# Patient Record
Sex: Female | Born: 1975 | Race: Black or African American | Hispanic: No | Marital: Single | Smoking: Current every day smoker
Health system: Southern US, Community
[De-identification: ages and names within clinical notes are randomized; demographics above are authoritative.]

## PROBLEM LIST (undated history)

## (undated) DIAGNOSIS — I10 Essential (primary) hypertension: Secondary | ICD-10-CM

## (undated) DIAGNOSIS — O24419 Gestational diabetes mellitus in pregnancy, unspecified control: Secondary | ICD-10-CM

## (undated) HISTORY — PX: ECTOPIC PREGNANCY SURGERY: SHX613

---

## 2006-03-14 ENCOUNTER — Emergency Department (HOSPITAL_COMMUNITY): Admission: EM | Admit: 2006-03-14 | Discharge: 2006-03-15 | Payer: Self-pay | Admitting: Emergency Medicine

## 2006-03-27 ENCOUNTER — Emergency Department (HOSPITAL_COMMUNITY): Admission: EM | Admit: 2006-03-27 | Discharge: 2006-03-27 | Payer: Self-pay | Admitting: Emergency Medicine

## 2010-04-03 ENCOUNTER — Emergency Department (HOSPITAL_BASED_OUTPATIENT_CLINIC_OR_DEPARTMENT_OTHER)
Admission: EM | Admit: 2010-04-03 | Discharge: 2010-04-03 | Disposition: A | Discharge status: Home / Self Care | Payer: Self-pay | Attending: Emergency Medicine | Admitting: Emergency Medicine

## 2010-04-03 DIAGNOSIS — N6459 Other signs and symptoms in breast: Secondary | ICD-10-CM | POA: Insufficient documentation

## 2010-04-03 DIAGNOSIS — N61 Mastitis without abscess: Secondary | ICD-10-CM | POA: Insufficient documentation

## 2010-04-03 DIAGNOSIS — F17200 Nicotine dependence, unspecified, uncomplicated: Secondary | ICD-10-CM | POA: Insufficient documentation

## 2010-04-03 LAB — BASIC METABOLIC PANEL
CO2: 22 mEq/L (ref 19–32)
GFR calc Af Amer: >60 mL/min (ref >60)
GFR calc non Af Amer: >60 mL/min (ref >60)
Glucose, Bld: 101 mg/dL — ABNORMAL HIGH (ref 70–99)
Potassium: 4.4 mEq/L (ref 3.5–5.1)
Sodium: 142 mEq/L (ref 135–145)

## 2010-04-03 LAB — DIFFERENTIAL
Basophils Absolute: 0 10*3/uL (ref 0.0–0.1)
Basophils Relative: 0 % (ref 0–1)
Lymphocytes Relative: 27 % (ref 12–46)
Neutro Abs: 4.6 10*3/uL (ref 1.7–7.7)
Neutrophils Relative %: 64 % (ref 43–77)

## 2010-04-03 LAB — CBC
HCT: 40 % (ref 36.0–46.0)
Hemoglobin: 13.6 g/dL (ref 12.0–15.0)
WBC: 7.1 10*3/uL (ref 4.0–10.5)

## 2010-04-03 LAB — PREGNANCY, URINE: Preg Test, Ur: NEGATIVE

## 2010-09-11 ENCOUNTER — Encounter: Payer: Self-pay | Admitting: *Deleted

## 2010-09-11 ENCOUNTER — Emergency Department (HOSPITAL_BASED_OUTPATIENT_CLINIC_OR_DEPARTMENT_OTHER)
Admission: EM | Admit: 2010-09-11 | Discharge: 2010-09-11 | Disposition: A | Discharge status: Home / Self Care | Payer: Self-pay | Attending: Emergency Medicine | Admitting: Emergency Medicine

## 2010-09-11 DIAGNOSIS — N63 Unspecified lump in unspecified breast: Secondary | ICD-10-CM | POA: Insufficient documentation

## 2010-09-11 DIAGNOSIS — N61 Mastitis without abscess: Secondary | ICD-10-CM | POA: Insufficient documentation

## 2010-09-11 DIAGNOSIS — N611 Abscess of the breast and nipple: Secondary | ICD-10-CM

## 2010-09-11 DIAGNOSIS — I10 Essential (primary) hypertension: Secondary | ICD-10-CM | POA: Insufficient documentation

## 2010-09-11 HISTORY — DX: Gestational diabetes mellitus in pregnancy, unspecified control: O24.419

## 2010-09-11 HISTORY — DX: Essential (primary) hypertension: I10

## 2010-09-11 MED ORDER — CLINDAMYCIN HCL 150 MG PO CAPS
300.0000 mg | ORAL_CAPSULE | Freq: Once | ORAL | Status: AC
  Administered 2010-09-11: 300 mg via ORAL
  Filled 2010-09-11: qty 2

## 2010-09-11 MED ORDER — CLINDAMYCIN HCL 150 MG PO CAPS: 300.0000 mg | ORAL_CAPSULE | Freq: Three times a day (TID) | ORAL | Status: AC

## 2010-09-11 MED ORDER — OXYCODONE-ACETAMINOPHEN 5-325 MG PO TABS: 2.0000 | ORAL_TABLET | ORAL | Status: AC | PRN

## 2010-09-11 NOTE — ED Notes (Signed)
Dr. Radford Pax in to aspirate left breast.  Skin prep with betadine.  Cleaned post procedure, band aid applied.

## 2010-09-11 NOTE — ED Notes (Signed)
Patient states she has an abscess on her left nipple which started one week ago.  States she had same abscess approximately 2 months ago, was treated and resolved.

## 2010-09-11 NOTE — ED Provider Notes (Signed)
History   Patient states she has an abscess on her left nipple which started one week ago. The pain and swelling has been increasing in severity. Fairly persistent. States she had same abscess approximately 2 months ago, was treated and resolved.  Denies nausea vomiting fever chills.   CSN: [id number] Arrival date & time: 09/11/2010  8:37 AM  Chief Complaint  Patient presents with  . Abscess    left nipple abscess   Patient is a 35 y.o. female presenting with abscess.  Abscess     Past Medical History  Diagnosis Date  . Hypertension   . Gestational diabetes     Past Surgical History  Procedure Date  . Ectopic pregnancy surgery     No family history on file.  History  Substance Use Topics  . Smoking status: Current Everyday Smoker -- 0.5 packs/day  . Smokeless tobacco: Not on file  . Alcohol Use: No    OB History    Grav Para Term Preterm Abortions TAB SAB Ect Mult Living                  Review of Systems  Physical Exam  BP 161/99  Pulse 63  Temp(Src) 98.8 F (37.1 C) (Oral)  Resp 20  Ht 5\' 5"  (1.651 m)  Wt 210 lb (95.255 kg)  BMI 34.95 kg/m2  SpO2 100%  LMP 09/09/2010  Physical Exam  Constitutional well-developed well-nourished black female HEENT: Normocephalic atraumatic. Cardiovascular normal right regular rhythm. Pulmonary: Normal effort no difficulty breathing.  Breast exam: Pendulous breasts warm left nipple tenderness at approximately 11:00. Some induration. No active drainage. Abdomen: Soft nontender obese Extremities: Normal with no abnormalities. Neuro: Negative. Skin: Warm dry no diaphoresis no rash.      ED Course  Procedures Needle aspiration: The left breast in areola area was prepped with Betadine. It was then anesthetized with Hurricaine spray. Using sterile technique an 18-gauge needle was injected at approximately 11:00 position with gentle aspiration. There is no pus or blood returned. Following the procedure the area was  cleansed and dressed with a Band-Aid and Neosporin. MDM Breast abscess, cellulitis, breast cyst, neoplasm.  Plan: Will start the patient on by mouth clindamycin and Percocet for pain and will encourage followup with GYN doctor within the next 4872 hours. She was also encouraged to use hot compresses to the area to encourage drainage. She is to return to the emergency department on should she develop any fever chills vomiting or other new symptomatology.      Nelia Shi, MD 09/11/10 [phone]

## 2010-12-14 ENCOUNTER — Encounter (HOSPITAL_BASED_OUTPATIENT_CLINIC_OR_DEPARTMENT_OTHER): Payer: Self-pay

## 2010-12-14 ENCOUNTER — Emergency Department (INDEPENDENT_AMBULATORY_CARE_PROVIDER_SITE_OTHER): Payer: Medicaid Other

## 2010-12-14 ENCOUNTER — Emergency Department (HOSPITAL_BASED_OUTPATIENT_CLINIC_OR_DEPARTMENT_OTHER): Payer: Medicaid Other

## 2010-12-14 ENCOUNTER — Emergency Department (HOSPITAL_BASED_OUTPATIENT_CLINIC_OR_DEPARTMENT_OTHER)
Admission: EM | Admit: 2010-12-14 | Discharge: 2010-12-14 | Disposition: A | Discharge status: Home / Self Care | Payer: Medicaid Other | Attending: Emergency Medicine | Admitting: Emergency Medicine

## 2010-12-14 DIAGNOSIS — O99891 Other specified diseases and conditions complicating pregnancy: Secondary | ICD-10-CM

## 2010-12-14 DIAGNOSIS — O209 Hemorrhage in early pregnancy, unspecified: Secondary | ICD-10-CM

## 2010-12-14 DIAGNOSIS — R110 Nausea: Secondary | ICD-10-CM

## 2010-12-14 DIAGNOSIS — I10 Essential (primary) hypertension: Secondary | ICD-10-CM | POA: Insufficient documentation

## 2010-12-14 DIAGNOSIS — F17200 Nicotine dependence, unspecified, uncomplicated: Secondary | ICD-10-CM | POA: Insufficient documentation

## 2010-12-14 DIAGNOSIS — R109 Unspecified abdominal pain: Secondary | ICD-10-CM

## 2010-12-14 LAB — BASIC METABOLIC PANEL
BUN: 8 mg/dL (ref 6–23)
CO2: 24 mEq/L (ref 19–32)
GFR calc non Af Amer: 82 mL/min — ABNORMAL LOW (ref >90)
Glucose, Bld: 121 mg/dL — ABNORMAL HIGH (ref 70–99)
Potassium: 4 mEq/L (ref 3.5–5.1)
Sodium: 137 mEq/L (ref 135–145)

## 2010-12-14 LAB — URINE MICROSCOPIC-ADD ON

## 2010-12-14 LAB — CBC
HCT: 39.8 % (ref 36.0–46.0)
Hemoglobin: 13.6 g/dL (ref 12.0–15.0)
MCH: 26.8 pg (ref 26.0–34.0)
MCHC: 34.2 g/dL (ref 30.0–36.0)
RBC: 5.07 MIL/uL (ref 3.87–5.11)

## 2010-12-14 LAB — URINALYSIS, ROUTINE W REFLEX MICROSCOPIC
Bilirubin Urine: NEGATIVE
Glucose, UA: NEGATIVE mg/dL
Specific Gravity, Urine: 1.022 (ref 1.005–1.030)
Urobilinogen, UA: 1 mg/dL (ref 0.0–1.0)

## 2010-12-14 LAB — PREGNANCY, URINE: Preg Test, Ur: POSITIVE

## 2010-12-14 LAB — HCG, QUANTITATIVE, PREGNANCY: hCG, Beta Chain, Quant, S: 1336 m[IU]/mL — ABNORMAL HIGH (ref <5)

## 2010-12-14 MED ORDER — SODIUM CHLORIDE 0.9 % IV SOLN: INTRAVENOUS | Status: DC

## 2010-12-14 MED ORDER — HYDROMORPHONE HCL PF 1 MG/ML IJ SOLN
INTRAMUSCULAR | Status: AC
  Administered 2010-12-14: 1 mg via INTRAVENOUS
  Filled 2010-12-14: qty 1

## 2010-12-14 MED ORDER — ONDANSETRON HCL 4 MG/2ML IJ SOLN
4.0000 mg | Freq: Once | INTRAMUSCULAR | Status: AC
  Administered 2010-12-14: 4 mg via INTRAVENOUS
  Filled 2010-12-14: qty 2

## 2010-12-14 MED ORDER — SODIUM CHLORIDE 0.9 % IV BOLUS (SEPSIS)
1000.0000 mL | Freq: Once | INTRAVENOUS | Status: AC
  Administered 2010-12-14: 1000 mL via INTRAVENOUS

## 2010-12-14 MED ORDER — HYDROMORPHONE HCL 2 MG/ML IJ SOLN: 1.0000 mg | Freq: Once | INTRAMUSCULAR | Status: AC

## 2010-12-14 MED ORDER — HYDROMORPHONE HCL PF 1 MG/ML IJ SOLN
1.0000 mg | Freq: Once | INTRAMUSCULAR | Status: AC
  Administered 2010-12-14: 1 mg via INTRAVENOUS
  Filled 2010-12-14: qty 1

## 2010-12-14 NOTE — ED Provider Notes (Addendum)
History     CSN: [id number] Arrival date & time: 12/14/2010  4:55 AM   First MD Initiated Contact with Patient 12/14/10 0450      Chief Complaint  Patient presents with  . Dysmenorrhea    (Consider location/radiation/quality/duration/timing/severity/associated sxs/prior treatment) Patient is a 35 y.o. female presenting with abdominal pain. The history is provided by the patient.  Abdominal Pain The primary symptoms of the illness include abdominal pain and vaginal bleeding. The primary symptoms of the illness do not include fever, shortness of breath, nausea, vomiting, diarrhea, dysuria or vaginal discharge. The current episode started more than 2 days ago (Onset 4 days ago.). The onset of the illness was sudden. The problem has been gradually worsening.  The abdominal pain is generalized (But greater in both lower quadrants of the abdomen, radiates to the back.). The abdominal pain radiates to the back. The severity of the abdominal pain is 10/10. The abdominal pain is relieved by nothing. Exacerbated by: Nothing.  Vaginal bleeding was first noticed 2 days ago. Vaginal bleeding other than menses is a new problem. Vaginal bleeding is resolved since it began.  The patient has not had a change in bowel habit. Additional symptoms associated with the illness include back pain. Symptoms associated with the illness do not include chills, diaphoresis, heartburn, constipation, urgency or frequency.   No history of abdominal surgery. Patient has been having a history of heavy vaginal bleeding at times in the past. The bleeding occurred 2 days ago lasted for one day and then stopped the abdominal pain has been present for 4 days. This morning the abdominal pain got worse with generalized greater in both lower quadrants. Pain currently is 10 out of 10. Described as sharp. Non-crampy. Prior menstrual period was 2 months ago. The onset of her mesh. Usually associated with heavy bleeding and some clots and  abdominal pain can last usually for 3 days. It is unusual that the bleeding was heavy for just one day this time. Patient is gravida 3 para 2 with a tubal pregnancy. Denies vaginal discharge. Past Medical History  Diagnosis Date  . Hypertension   . Gestational diabetes     Past Surgical History  Procedure Date  . Ectopic pregnancy surgery     History reviewed. No pertinent family history.  History  Substance Use Topics  . Smoking status: Current Everyday Smoker -- 0.5 packs/day    Types: Cigarettes  . Smokeless tobacco: Never Used  . Alcohol Use: No    OB History    Grav Para Term Preterm Abortions TAB SAB Ect Mult Living                  Review of Systems  Constitutional: Negative for fever, chills and diaphoresis.  HENT: Negative for neck pain.   Eyes: Negative for visual disturbance.  Respiratory: Negative for shortness of breath.   Gastrointestinal: Positive for abdominal pain. Negative for heartburn, nausea, vomiting, diarrhea and constipation.  Genitourinary: Positive for vaginal bleeding. Negative for dysuria, urgency, frequency, flank pain and vaginal discharge.  Musculoskeletal: Positive for back pain.  Neurological: Negative for dizziness and headaches.  Hematological: Does not bruise/bleed easily.    Allergies  Review of patient's allergies indicates no known allergies.  Home Medications  No current outpatient prescriptions on file.  BP 129/76  Pulse 69  Temp(Src) 97.9 F (36.6 C) (Oral)  Resp 18  Ht 5\' 5"  (1.651 m)  Wt 210 lb (95.255 kg)  BMI 34.95 kg/m2  SpO2 100%  LMP 10/14/2010  Physical Exam  Nursing note and vitals reviewed. Constitutional: She is oriented to person, place, and time. She appears well-developed and well-nourished.  HENT:  Head: Normocephalic and atraumatic.  Eyes: Conjunctivae and EOM are normal. Pupils are equal, round, and reactive to light.  Neck: Normal range of motion. Neck supple.  Cardiovascular: Normal rate,  regular rhythm and normal heart sounds.   No murmur heard. Pulmonary/Chest: Effort normal and breath sounds normal.  Abdominal: Soft. Bowel sounds are normal. She exhibits no mass. There is no tenderness.  Genitourinary: No vaginal discharge found.       No adnexal mass or tenderness, no vaginal bleeding. Uterus not enlarged. Exam limited by body habitus.   Musculoskeletal: Normal range of motion.  Neurological: She is alert and oriented to person, place, and time. No cranial nerve deficit. She exhibits normal muscle tone. Coordination normal.  Skin: Skin is warm. No rash noted.    ED Course  Procedures (including critical care time)   Labs Reviewed  CBC  BASIC METABOLIC PANEL  URINALYSIS, ROUTINE W REFLEX MICROSCOPIC  PREGNANCY, URINE   Results for orders placed during the hospital encounter of 12/14/10  CBC      Component Value Range   WBC 8.6  4.0 - 10.5 (K/uL)   RBC 5.07  3.87 - 5.11 (MIL/uL)   Hemoglobin 13.6  12.0 - 15.0 (g/dL)   HCT 16.1  09.6 - 04.5 (%)   MCV 78.5  78.0 - 100.0 (fL)   MCH 26.8  26.0 - 34.0 (pg)   MCHC 34.2  30.0 - 36.0 (g/dL)   RDW 40.9  81.1 - 91.4 (%)   Platelets 248  150 - 400 (K/uL)  BASIC METABOLIC PANEL      Component Value Range   Sodium 137  135 - 145 (mEq/L)   Potassium 4.0  3.5 - 5.1 (mEq/L)   Chloride 105  96 - 112 (mEq/L)   CO2 24  19 - 32 (mEq/L)   Glucose, Bld 121 (*) 70 - 99 (mg/dL)   BUN 8  6 - 23 (mg/dL)   Creatinine, Ser 7.82  0.50 - 1.10 (mg/dL)   Calcium 9.2  8.4 - 95.6 (mg/dL)   GFR calc non Af Amer 82 (*) >90 (mL/min)   GFR calc Af Amer >90  >90 (mL/min)  URINALYSIS, ROUTINE W REFLEX MICROSCOPIC      Component Value Range   Color, Urine YELLOW  YELLOW    Appearance CLOUDY (*) CLEAR    Specific Gravity, Urine 1.022  1.005 - 1.030    pH 5.5  5.0 - 8.0    Glucose, UA NEGATIVE  NEGATIVE (mg/dL)   Hgb urine dipstick TRACE (*) NEGATIVE    Bilirubin Urine NEGATIVE  NEGATIVE    Ketones, ur NEGATIVE  NEGATIVE (mg/dL)    Protein, ur NEGATIVE  NEGATIVE (mg/dL)   Urobilinogen, UA 1.0  0.0 - 1.0 (mg/dL)   Nitrite NEGATIVE  NEGATIVE    Leukocytes, UA SMALL (*) NEGATIVE   PREGNANCY, URINE      Component Value Range   Preg Test, Ur POSITIVE    URINE MICROSCOPIC-ADD ON      Component Value Range   Squamous Epithelial / LPF MANY (*) RARE    WBC, UA 3-6  <3 (WBC/hpf)   RBC / HPF 3-6  <3 (RBC/hpf)   Bacteria, UA MANY (*) RARE      Impression: Pregnancy 9 weeks by dates Lower quadrant abdominal pain    MDM  Patient history of heavy vaginal bleeding at times periods can be irregular especially. Was 2 months ago however vaginal bleeding lasted only for one day and was 2 days ago. With unusual as the patient had, pain prior to the menstrual bleeding and and has continued through the menstrual bleeding until now. The pain is generalized but localized more to the lower quadrants radiates to the back. CBC reveals no leukocytosis. CT of the abdomen and pelvis ordered to rule out acute abdominal process. Urine pregnancy test pending.  Progressive tests is positive discussed with patient last measured. Was September 9 at approximately 9 weeks by dates CT of abdomen has been canceled. Have ordered OB ultrasound to rule out ectopic pregnancy. Patient's abdominal pain improved with pain medicines.  As stated above patient has had a prior tubal pregnancy. She underwent surgery for that tubal pregnancy and had her tube removed does not recall which side. Patient has no recollection of getting RhoGAM shots in her previous pregnancies. Does not recall her blood type. Ultrasound will be available at 8:00 in the morning.        Shelda Jakes, MD 12/14/10 [id number]  Shelda Jakes, MD 12/14/10 2020

## 2010-12-14 NOTE — ED Notes (Signed)
Report received from Maryruth Hancock, RN, care assumed.

## 2010-12-14 NOTE — ED Notes (Signed)
Pt returned from ultrasound

## 2010-12-14 NOTE — ED Notes (Signed)
Pt asking for pain medication, MD informed and new orders received.

## 2010-12-14 NOTE — ED Notes (Signed)
Pt states that she had not had a period in over two months, last night she began bleeding and pt states that bleeding was more heavy than normal.  Pt states that she has changed pads 6 times in the past 48 hours.  Pt states that she has severe abdominal cramping which extends to her back and pelvic area.

## 2010-12-14 NOTE — ED Provider Notes (Signed)
  Physical Exam  BP 147/68  Pulse 57  Temp(Src) 97.9 F (36.6 C) (Oral)  Resp 18  Ht 5\' 5"  (1.651 m)  Wt 210 lb (95.255 kg)  BMI 34.95 kg/m2  SpO2 100%  LMP 10/11/2010  Physical Exam  ED Course  Procedures  MDM 35yF with abdominal/back pain and early pregnancy. No IUP on [place]. Sharene Butters is only 1,300 though. Discussed with pt and fiance possibilities of early pregnancy, ongoing abortion or possible ectopic. PT understands need for follow-up quant and [place] in 2 days. Understands signs/symptoms to return for re-evaluation sooner. Pt HD stable and NAD upon DC.     Raeford Razor, MD 12/14/10 [phone]

## 2010-12-14 NOTE — ED Notes (Signed)
Awaiting ultrasound.

## 2010-12-31 ENCOUNTER — Emergency Department (HOSPITAL_BASED_OUTPATIENT_CLINIC_OR_DEPARTMENT_OTHER)
Admission: EM | Admit: 2010-12-31 | Discharge: 2010-12-31 | Disposition: A | Discharge status: Home / Self Care | Payer: Medicaid Other | Attending: Emergency Medicine | Admitting: Emergency Medicine

## 2010-12-31 ENCOUNTER — Encounter (HOSPITAL_BASED_OUTPATIENT_CLINIC_OR_DEPARTMENT_OTHER): Payer: Self-pay | Admitting: *Deleted

## 2010-12-31 ENCOUNTER — Emergency Department (INDEPENDENT_AMBULATORY_CARE_PROVIDER_SITE_OTHER): Payer: Medicaid Other

## 2010-12-31 DIAGNOSIS — O2690 Pregnancy related conditions, unspecified, unspecified trimester: Secondary | ICD-10-CM | POA: Insufficient documentation

## 2010-12-31 DIAGNOSIS — I10 Essential (primary) hypertension: Secondary | ICD-10-CM | POA: Insufficient documentation

## 2010-12-31 DIAGNOSIS — R112 Nausea with vomiting, unspecified: Secondary | ICD-10-CM

## 2010-12-31 DIAGNOSIS — R109 Unspecified abdominal pain: Secondary | ICD-10-CM

## 2010-12-31 DIAGNOSIS — F17200 Nicotine dependence, unspecified, uncomplicated: Secondary | ICD-10-CM | POA: Insufficient documentation

## 2010-12-31 DIAGNOSIS — Z331 Pregnant state, incidental: Secondary | ICD-10-CM

## 2010-12-31 DIAGNOSIS — D259 Leiomyoma of uterus, unspecified: Secondary | ICD-10-CM

## 2010-12-31 DIAGNOSIS — O99891 Other specified diseases and conditions complicating pregnancy: Secondary | ICD-10-CM

## 2010-12-31 DIAGNOSIS — O3410 Maternal care for benign tumor of corpus uteri, unspecified trimester: Secondary | ICD-10-CM

## 2010-12-31 LAB — WET PREP, GENITAL
Clue Cells Wet Prep HPF POC: NONE SEEN
Trich, Wet Prep: NONE SEEN

## 2010-12-31 LAB — URINALYSIS, ROUTINE W REFLEX MICROSCOPIC
Bilirubin Urine: NEGATIVE
Glucose, UA: NEGATIVE mg/dL
Ketones, ur: NEGATIVE mg/dL
Protein, ur: NEGATIVE mg/dL
Urobilinogen, UA: 0.2 mg/dL (ref 0.0–1.0)

## 2010-12-31 LAB — COMPREHENSIVE METABOLIC PANEL
Alkaline Phosphatase: 59 U/L (ref 39–117)
BUN: 9 mg/dL (ref 6–23)
Creatinine, Ser: 0.9 mg/dL (ref 0.50–1.10)
GFR calc Af Amer: >90 mL/min (ref >90)
Glucose, Bld: 119 mg/dL — ABNORMAL HIGH (ref 70–99)
Potassium: 3.8 mEq/L (ref 3.5–5.1)
Total Bilirubin: 0.2 mg/dL — ABNORMAL LOW (ref 0.3–1.2)
Total Protein: 7.1 g/dL (ref 6.0–8.3)

## 2010-12-31 LAB — CBC
HCT: 39.4 % (ref 36.0–46.0)
Hemoglobin: 13.2 g/dL (ref 12.0–15.0)
MCH: 26.7 pg (ref 26.0–34.0)
MCHC: 33.5 g/dL (ref 30.0–36.0)
MCHC: 34.2 g/dL (ref 30.0–36.0)
MCV: 78.2 fL (ref 78.0–100.0)
MCV: 78.6 fL (ref 78.0–100.0)
Platelets: 206 10*3/uL (ref 150–400)
RBC: 4.68 MIL/uL (ref 3.87–5.11)
RDW: 14 % (ref 11.5–15.5)

## 2010-12-31 LAB — DIFFERENTIAL
Basophils Relative: 0 % (ref 0–1)
Eosinophils Absolute: 0.1 10*3/uL (ref 0.0–0.7)
Eosinophils Relative: 1 % (ref 0–5)
Lymphs Abs: 2.8 10*3/uL (ref 0.7–4.0)
Monocytes Relative: 8 % (ref 3–12)

## 2010-12-31 LAB — URINE MICROSCOPIC-ADD ON

## 2010-12-31 LAB — GC/CHLAMYDIA PROBE AMP, GENITAL
Chlamydia, DNA Probe: NEGATIVE
GC Probe Amp, Genital: NEGATIVE

## 2010-12-31 LAB — RPR: RPR Ser Ql: NONREACTIVE

## 2010-12-31 LAB — HCG, QUANTITATIVE, PREGNANCY: hCG, Beta Chain, Quant, S: 35597 m[IU]/mL — ABNORMAL HIGH (ref <5)

## 2010-12-31 MED ORDER — MORPHINE SULFATE 4 MG/ML IJ SOLN
4.0000 mg | Freq: Once | INTRAMUSCULAR | Status: AC
  Administered 2010-12-31: 4 mg via INTRAVENOUS
  Filled 2010-12-31: qty 1

## 2010-12-31 MED ORDER — ONDANSETRON HCL 4 MG/2ML IJ SOLN
4.0000 mg | Freq: Once | INTRAMUSCULAR | Status: AC
  Administered 2010-12-31: 4 mg via INTRAVENOUS
  Filled 2010-12-31: qty 2

## 2010-12-31 MED ORDER — SODIUM CHLORIDE 0.9 % IV BOLUS (SEPSIS)
1000.0000 mL | Freq: Once | INTRAVENOUS | Status: AC
  Administered 2010-12-31: 1000 mL via INTRAVENOUS

## 2010-12-31 NOTE — ED Notes (Signed)
Pt states that she was seen here earlier this month for questionable pregnancy and had low HCG count. Pt [facility] and has an appt there on Monday but has had worsening abd pain tonight with vomiting.

## 2010-12-31 NOTE — ED Notes (Signed)
Pt given ice chips. Pt aware of plan of care and wait for ultrasound tech this am.

## 2010-12-31 NOTE — ED Notes (Signed)
Pt transported to ultrasound.

## 2010-12-31 NOTE — ED Notes (Signed)
Asked pt if she has an ob/gyn states she doesn't yet but has an appointment with the health dept next week to obtain necessary paperwork to get medicaid and obtain an ob/gyn. Verbalizes understanding of importance of prenatal care.

## 2010-12-31 NOTE — ED Provider Notes (Signed)
No acute abnormality seen on abdominal US.  Pelvic US with 7 w. 2 days iug.  Patient advised close followup with prenatal care by OB.  Hilario Quarry, MD 12/31/10 [phone]

## 2010-12-31 NOTE — ED Notes (Signed)
Returned from ultrasound pt is sitting up on side of bed looking at magazine

## 2010-12-31 NOTE — ED Notes (Signed)
Md made aware of pt and condition, pt states she doesn't want pain meds just wants to know whats wrong

## 2010-12-31 NOTE — ED Notes (Signed)
MD at bedside. 

## 2010-12-31 NOTE — ED Notes (Signed)
Pt c/o pain again when waking, MD made aware.

## 2010-12-31 NOTE — ED Provider Notes (Signed)
History     CSN: [id number] Arrival date & time: 12/31/2010 12:26 AM   First MD Initiated Contact with Patient 12/31/10 0127      Chief Complaint  Patient presents with  . Abdominal Pain    (Consider location/radiation/quality/duration/timing/severity/associated sxs/prior treatment) HPI 35 year old female who is currently pregnant in first trimester had an onset at 3 PM of crampy periumbilical pain. Pain has gotten worse and is now severe. There is no radiation of pain. Current pain is rated at 7/10, but pain has been as severe as 9/10. Nothing makes it better, nothing makes it worse. There has been no associated nausea, vomiting, or diarrhea. She denies any vaginal discharge or bleeding. She denies fever, chills, sweats. She is gravida 4 para 2 with one ectopic pregnancy. Past Medical History  Diagnosis Date  . Hypertension   . Gestational diabetes     Past Surgical History  Procedure Date  . Ectopic pregnancy surgery     History reviewed. No pertinent family history.  History  Substance Use Topics  . Smoking status: Current Everyday Smoker -- 0.5 packs/day    Types: Cigarettes  . Smokeless tobacco: Never Used  . Alcohol Use: No    OB History    Grav Para Term Preterm Abortions TAB SAB Ect Mult Living   1               Review of Systems  Allergies  Review of patient's allergies indicates no known allergies.  Home Medications  No current outpatient prescriptions on file.  BP 157/81  Pulse 71  Temp(Src) 98.8 F (37.1 C) (Oral)  Resp 20  Ht 5\' 6"  (1.676 m)  Wt 220 lb (99.791 kg)  BMI 35.51 kg/m2  SpO2 100%  LMP 11/10/2010  Physical Exam 35 year old female who appears uncomfortable. Vital signs are normal. PERRLA, EOMI without scleral icterus. Head is normocephalic and atraumatic. Neck is supple without adenopathy. Lungs are clear without rales, wheezes, rhonchi heart has regular rate and rhythm without murmur. Back is nontender and there is no CVA  tenderness. Abdomen is obese and soft. There is tenderness along the midline from the xiphoid to the suprapubic area with worse tenderness in the periumbilical area. There is no rebound or guarding. Peristalsis is diminished. Pelvic: Normal external female genitalia. Cervix is closed and there is only scant vaginal discharge with no vaginal bleeding. There is bilateral adnexal tenderness without any masses felt. Fundus is retroverted and difficult to estimate size. There is no cervical motion tenderness. There is no tenderness outside of the midline. Extremities have no cyanosis or edema. Skin is warm and moist without rashes. Neurologic: Mental status is normal, cranial nerves are intact, there are no motor or sensory deficits. Psychiatric: No abnormalities of mood or affect. ED Course  Procedures (including critical care time)  Labs Reviewed  URINALYSIS, ROUTINE W REFLEX MICROSCOPIC - Abnormal; Notable for the following:    Hgb urine dipstick SMALL (*)    Leukocytes, UA TRACE (*)    All other components within normal limits  HCG, QUANTITATIVE, PREGNANCY - Abnormal; Notable for the following:    hCG, Beta Chain, Quant, S [id number] (*)    All other components within normal limits  COMPREHENSIVE METABOLIC PANEL - Abnormal; Notable for the following:    Glucose, Bld 119 (*)    Albumin 3.2 (*)    Total Bilirubin 0.2 (*)    GFR calc non Af Amer 82 (*)    All other components within normal limits  URINE  MICROSCOPIC-ADD ON - Abnormal; Notable for the following:    Squamous Epithelial / LPF FEW (*)    All other components within normal limits  PREGNANCY, URINE  CBC  ABO/RH  GC/CHLAMYDIA PROBE AMP, GENITAL  RPR  WET PREP, GENITAL   No results found. Results for orders placed during the hospital encounter of 12/31/10  PREGNANCY, URINE      Component Value Range   Preg Test, Ur POSITIVE    URINALYSIS, ROUTINE W REFLEX MICROSCOPIC      Component Value Range   Color, Urine YELLOW  YELLOW     APPearance CLEAR  CLEAR    Specific Gravity, Urine 1.021  1.005 - 1.030    pH 5.0  5.0 - 8.0    Glucose, UA NEGATIVE  NEGATIVE (mg/dL)   Hgb urine dipstick SMALL (*) NEGATIVE    Bilirubin Urine NEGATIVE  NEGATIVE    Ketones, ur NEGATIVE  NEGATIVE (mg/dL)   Protein, ur NEGATIVE  NEGATIVE (mg/dL)   Urobilinogen, UA 0.2  0.0 - 1.0 (mg/dL)   Nitrite NEGATIVE  NEGATIVE    Leukocytes, UA TRACE (*) NEGATIVE   HCG, QUANTITATIVE, PREGNANCY      Component Value Range   hCG, Beta Chain, Quant, S [id number] (*) <5 (mIU/mL)  CBC      Component Value Range   WBC 10.0  4.0 - 10.5 (K/uL)   RBC 5.01  3.87 - 5.11 (MIL/uL)   Hemoglobin 13.2  12.0 - 15.0 (g/dL)   HCT 40.9  81.1 - 91.4 (%)   MCV 78.6  78.0 - 100.0 (fL)   MCH 26.3  26.0 - 34.0 (pg)   MCHC 33.5  30.0 - 36.0 (g/dL)   RDW 78.2  95.6 - 21.3 (%)   Platelets 231  150 - 400 (K/uL)  COMPREHENSIVE METABOLIC PANEL      Component Value Range   Sodium 137  135 - 145 (mEq/L)   Potassium 3.8  3.5 - 5.1 (mEq/L)   Chloride 104  96 - 112 (mEq/L)   CO2 25  19 - 32 (mEq/L)   Glucose, Bld 119 (*) 70 - 99 (mg/dL)   BUN 9  6 - 23 (mg/dL)   Creatinine, Ser 0.86  0.50 - 1.10 (mg/dL)   Calcium 9.3  8.4 - 57.8 (mg/dL)   Total Protein 7.1  6.0 - 8.3 (g/dL)   Albumin 3.2 (*) 3.5 - 5.2 (g/dL)   AST 12  0 - 37 (U/L)   ALT 8  0 - 35 (U/L)   Alkaline Phosphatase 59  39 - 117 (U/L)   Total Bilirubin 0.2 (*) 0.3 - 1.2 (mg/dL)   GFR calc non Af Amer 82 (*) >90 (mL/min)   GFR calc Af Amer >90  >90 (mL/min)  URINE MICROSCOPIC-ADD ON      Component Value Range   Squamous Epithelial / LPF FEW (*) RARE    WBC, UA 3-6  <3 (WBC/hpf)   RBC / HPF 3-6  <3 (RBC/hpf)   Bacteria, UA RARE  RARE    Urine-Other MUCOUS PRESENT    ABO/RH      Component Value Range   ABO/RH(D) A POS    WET PREP, GENITAL      Component Value Range   Yeast, Wet Prep NONE SEEN  NONE SEEN    Trich, Wet Prep NONE SEEN  NONE SEEN    Clue Cells, Wet Prep NONE SEEN  NONE SEEN    WBC, Wet Prep HPF  POC MODERATE (*) NONE  SEEN    US Ob Comp Less 14 Wks  12/14/2010  *RADIOLOGY REPORT*  Clinical Data: Vaginal bleeding and abdominal pain.  Clinical gestation of 9 weeks.  OBSTETRIC <14 WK ULTRASOUND, TRANSVAGINAL OB US  Technique:  Transabdominal and transvaginal ultrasound was performed for evaluation of the gestation as well as the maternal uterus and adnexal regions.  Findings:  There is no evidence for intrauterine gestational sac, yolk sac, embryo or cardiac activity.  Maternal uterus/adnexae:  The right ovary appears slightly heterogeneous and enlarged.  The left ovary appears normal.  There is no free fluid within the pelvis.  Incidentally noted is a 0.7 cm hypoechoic nodule in the anterior uterine body.  IMPRESSION:  1.  There is no evidence for intrauterine pregnancy.  Findings may reflect spontaneous abortion, early pregnancy, or occult ectopic pregnancy.  Serial follow-up beta HCG and pelvic sonography is recommended. 2.  Small fibroid within the anterior myometrium.  Original Report Authenticated By: Rosealee Albee, M.D.   US Ob Transvaginal  12/14/2010  *RADIOLOGY REPORT*  Clinical Data: Vaginal bleeding and abdominal pain.  Clinical gestation of 9 weeks.  OBSTETRIC <14 WK ULTRASOUND, TRANSVAGINAL OB US  Technique:  Transabdominal and transvaginal ultrasound was performed for evaluation of the gestation as well as the maternal uterus and adnexal regions.  Findings:  There is no evidence for intrauterine gestational sac, yolk sac, embryo or cardiac activity.  Maternal uterus/adnexae:  The right ovary appears slightly heterogeneous and enlarged.  The left ovary appears normal.  There is no free fluid within the pelvis.  Incidentally noted is a 0.7 cm hypoechoic nodule in the anterior uterine body.  IMPRESSION:  1.  There is no evidence for intrauterine pregnancy.  Findings may reflect spontaneous abortion, early pregnancy, or occult ectopic pregnancy.  Serial follow-up beta HCG and pelvic  sonography is recommended. 2.  Small fibroid within the anterior myometrium.  Original Report Authenticated By: Rosealee Albee, M.D.      No diagnosis found.  Pain is improved after IV fluids and IV morphine.  MDM  Abdominal pain in pregnancy specific etiology unclear. Ectopic pregnancy is felt to be on likely with pain centered mainly in the periumbilical area, hCG level over 30,000, and no anemia. Ultrasound has been ordered, and CBC will be repeated.        Dione Booze, MD 01/02/11 [phone]

## 2010-12-31 NOTE — ED Notes (Signed)
Pt ambulated to restroom per pt request. NAD noted.

## 2010-12-31 NOTE — ED Notes (Signed)
C/o abd pain near her navel with vomiting since 6pm

## 2011-02-17 ENCOUNTER — Emergency Department (INDEPENDENT_AMBULATORY_CARE_PROVIDER_SITE_OTHER): Payer: No Typology Code available for payment source

## 2011-02-17 ENCOUNTER — Encounter (HOSPITAL_BASED_OUTPATIENT_CLINIC_OR_DEPARTMENT_OTHER): Payer: Self-pay | Admitting: *Deleted

## 2011-02-17 ENCOUNTER — Emergency Department (HOSPITAL_BASED_OUTPATIENT_CLINIC_OR_DEPARTMENT_OTHER)
Admission: EM | Admit: 2011-02-17 | Discharge: 2011-02-17 | Disposition: A | Discharge status: Home / Self Care | Payer: No Typology Code available for payment source | Attending: Emergency Medicine | Admitting: Emergency Medicine

## 2011-02-17 DIAGNOSIS — Z331 Pregnant state, incidental: Secondary | ICD-10-CM

## 2011-02-17 DIAGNOSIS — R109 Unspecified abdominal pain: Secondary | ICD-10-CM

## 2011-02-17 DIAGNOSIS — F17200 Nicotine dependence, unspecified, uncomplicated: Secondary | ICD-10-CM | POA: Insufficient documentation

## 2011-02-17 DIAGNOSIS — Y92410 Unspecified street and highway as the place of occurrence of the external cause: Secondary | ICD-10-CM | POA: Insufficient documentation

## 2011-02-17 DIAGNOSIS — O2690 Pregnancy related conditions, unspecified, unspecified trimester: Secondary | ICD-10-CM | POA: Insufficient documentation

## 2011-02-17 DIAGNOSIS — O26899 Other specified pregnancy related conditions, unspecified trimester: Secondary | ICD-10-CM

## 2011-02-17 DIAGNOSIS — I10 Essential (primary) hypertension: Secondary | ICD-10-CM | POA: Insufficient documentation

## 2011-02-17 LAB — DIFFERENTIAL
Basophils Absolute: 0 10*3/uL (ref 0.0–0.1)
Basophils Relative: 0 % (ref 0–1)
Eosinophils Absolute: 0.1 10*3/uL (ref 0.0–0.7)
Eosinophils Relative: 1 % (ref 0–5)
Neutrophils Relative %: 61 % (ref 43–77)

## 2011-02-17 LAB — CBC
MCH: 27 pg (ref 26.0–34.0)
MCV: 79.4 fL (ref 78.0–100.0)
Platelets: 210 10*3/uL (ref 150–400)
RDW: 13.3 % (ref 11.5–15.5)
WBC: 9.3 10*3/uL (ref 4.0–10.5)

## 2011-02-17 LAB — BASIC METABOLIC PANEL
Calcium: 9.3 mg/dL (ref 8.4–10.5)
GFR calc Af Amer: 84 mL/min — ABNORMAL LOW (ref >90)
GFR calc non Af Amer: 72 mL/min — ABNORMAL LOW (ref >90)
Sodium: 137 mEq/L (ref 135–145)

## 2011-02-17 MED ORDER — HYDROCODONE-ACETAMINOPHEN 5-325 MG PO TABS: 1.0000 | ORAL_TABLET | Freq: Four times a day (QID) | ORAL | Status: AC | PRN

## 2011-02-17 MED ORDER — HYDROCODONE-ACETAMINOPHEN 5-325 MG PO TABS
1.0000 | ORAL_TABLET | Freq: Once | ORAL | Status: AC
  Administered 2011-02-17: 1 via ORAL
  Filled 2011-02-17: qty 1
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
## 2011-03-02 ENCOUNTER — Emergency Department (HOSPITAL_BASED_OUTPATIENT_CLINIC_OR_DEPARTMENT_OTHER)
Admission: EM | Admit: 2011-03-02 | Discharge: 2011-03-02 | Disposition: A | Discharge status: Home / Self Care | Payer: Medicaid Other | Attending: Emergency Medicine | Admitting: Emergency Medicine

## 2011-03-02 ENCOUNTER — Encounter (HOSPITAL_BASED_OUTPATIENT_CLINIC_OR_DEPARTMENT_OTHER): Payer: Self-pay | Admitting: *Deleted

## 2011-03-02 DIAGNOSIS — O210 Mild hyperemesis gravidarum: Secondary | ICD-10-CM | POA: Insufficient documentation

## 2011-03-02 DIAGNOSIS — O2390 Unspecified genitourinary tract infection in pregnancy, unspecified trimester: Secondary | ICD-10-CM | POA: Insufficient documentation

## 2011-03-02 DIAGNOSIS — I10 Essential (primary) hypertension: Secondary | ICD-10-CM | POA: Insufficient documentation

## 2011-03-02 DIAGNOSIS — N390 Urinary tract infection, site not specified: Secondary | ICD-10-CM

## 2011-03-02 DIAGNOSIS — F17200 Nicotine dependence, unspecified, uncomplicated: Secondary | ICD-10-CM | POA: Insufficient documentation

## 2011-03-02 DIAGNOSIS — O219 Vomiting of pregnancy, unspecified: Secondary | ICD-10-CM

## 2011-03-02 LAB — URINALYSIS, ROUTINE W REFLEX MICROSCOPIC
Nitrite: NEGATIVE
Protein, ur: NEGATIVE mg/dL
Urobilinogen, UA: 1 mg/dL (ref 0.0–1.0)

## 2011-03-02 LAB — URINE MICROSCOPIC-ADD ON

## 2011-03-02 MED ORDER — NITROFURANTOIN MONOHYD MACRO 100 MG PO CAPS: 100.0000 mg | ORAL_CAPSULE | Freq: Two times a day (BID) | ORAL | Status: AC

## 2011-03-02 MED ORDER — ONDANSETRON HCL 4 MG PO TABS: 4.0000 mg | ORAL_TABLET | Freq: Four times a day (QID) | ORAL | Status: AC

## 2011-03-02 MED ORDER — NITROFURANTOIN MONOHYD MACRO 100 MG PO CAPS
100.0000 mg | ORAL_CAPSULE | Freq: Once | ORAL | Status: AC
  Administered 2011-03-02: 100 mg via ORAL
  Filled 2011-03-02: qty 1

## 2011-03-02 MED ORDER — ONDANSETRON 4 MG PO TBDP
4.0000 mg | ORAL_TABLET | Freq: Once | ORAL | Status: AC
  Administered 2011-03-02: 4 mg via ORAL
  Filled 2011-03-02: qty 1

## 2011-03-02 NOTE — ED Notes (Signed)
Dr. Tucich at bedside. 
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
## 2011-03-02 NOTE — ED Notes (Signed)
Pt able to keep down second cup of ginger ale.

## 2011-03-02 NOTE — ED Notes (Signed)
Pt amb to triage with quick steady gait in nad. Pt states she is [redacted] weeks pregnant and has been unable to retain any po x 3 days. Denies any bleeding, spotting, discharge or other c/o.

## 2011-03-02 NOTE — ED Notes (Signed)
Pt tolerating sips of ginger ale. Still unable to provide urine sample.

## 2011-03-14 ENCOUNTER — Encounter (HOSPITAL_BASED_OUTPATIENT_CLINIC_OR_DEPARTMENT_OTHER): Payer: Self-pay | Admitting: *Deleted

## 2011-03-14 DIAGNOSIS — K5900 Constipation, unspecified: Secondary | ICD-10-CM | POA: Insufficient documentation

## 2011-03-14 DIAGNOSIS — F17200 Nicotine dependence, unspecified, uncomplicated: Secondary | ICD-10-CM | POA: Insufficient documentation

## 2011-03-14 DIAGNOSIS — O9989 Other specified diseases and conditions complicating pregnancy, childbirth and the puerperium: Secondary | ICD-10-CM | POA: Insufficient documentation

## 2011-03-14 DIAGNOSIS — I10 Essential (primary) hypertension: Secondary | ICD-10-CM | POA: Insufficient documentation

## 2011-03-14 NOTE — ED Notes (Signed)
Pt states that she has not had a Bm x 8 days was seen by her PCP and told to stop taking her prenatal vitamins and get plenty of fluids. Pt also with abd and rectal pain

## 2011-03-15 ENCOUNTER — Emergency Department (HOSPITAL_BASED_OUTPATIENT_CLINIC_OR_DEPARTMENT_OTHER)
Admission: EM | Admit: 2011-03-15 | Discharge: 2011-03-15 | Disposition: A | Discharge status: Home / Self Care | Payer: Medicaid Other | Attending: Emergency Medicine | Admitting: Emergency Medicine

## 2011-03-15 DIAGNOSIS — K5900 Constipation, unspecified: Secondary | ICD-10-CM

## 2011-03-15 DIAGNOSIS — O99619 Diseases of the digestive system complicating pregnancy, unspecified trimester: Secondary | ICD-10-CM

## 2011-03-15 MED ORDER — PROMETHAZINE HCL 25 MG PO TABS
ORAL_TABLET | ORAL | Status: AC
  Filled 2011-03-15: qty 1

## 2011-03-15 MED ORDER — PROMETHAZINE HCL 25 MG RE SUPP: 25.0000 mg | Freq: Four times a day (QID) | RECTAL | Status: AC | PRN

## 2011-03-15 MED ORDER — DOCUSATE SODIUM 100 MG PO CAPS
100.0000 mg | ORAL_CAPSULE | Freq: Once | ORAL | Status: AC
Start: 2011-03-15 — End: 2011-03-15
  Administered 2011-03-15: 100 mg via ORAL
  Filled 2011-03-15: qty 1

## 2011-03-15 MED ORDER — DOCUSATE SODIUM 250 MG PO CAPS: 250.0000 mg | ORAL_CAPSULE | Freq: Every day | ORAL | Status: AC

## 2011-03-15 MED ORDER — PROMETHAZINE HCL 25 MG PO TABS
25.0000 mg | ORAL_TABLET | Freq: Once | ORAL | Status: AC
  Administered 2011-03-15: 25 mg via ORAL
  Filled 2011-03-15: qty 1

## 2011-03-15 NOTE — ED Provider Notes (Signed)
History     CSN: [id number]  Arrival date & time 03/14/11  2327   First MD Initiated Contact with Patient 03/15/11 0059      Chief Complaint  Patient presents with  . Constipation    (Consider location/radiation/quality/duration/timing/severity/associated sxs/prior treatment) Patient is a 36 y.o. female presenting with constipation. The history is provided by the patient.  Constipation  Pertinent negatives include no fever, no abdominal pain, no chest pain, no headaches and no rash.   patient is currently pregnant in her first trimester and taking prenatal vitamins. She has not had a bowel movement in 8 days. She saw her OB/GYN earlier this week who recommended that she increase her fluid intake and drink more water. She has been doing a but denies any other Interventions and continues to have constipation. No abdominal pain. No vaginal bleeding. No other problems with this pregnancy. Moderate in severity. No blood in stools. No vomiting. No fevers. No recent illness otherwise. Symptoms constant since onset.  Past Medical History  Diagnosis Date  . Hypertension   . Gestational diabetes     Past Surgical History  Procedure Date  . Ectopic pregnancy surgery     History reviewed. No pertinent family history.  History  Substance Use Topics  . Smoking status: Current Everyday Smoker -- 0.5 packs/day    Types: Cigarettes  . Smokeless tobacco: Never Used  . Alcohol Use: No    OB History    Grav Para Term Preterm Abortions TAB SAB Ect Mult Living   1               Review of Systems  Constitutional: Negative for fever and chills.  HENT: Negative for neck pain and neck stiffness.   Eyes: Negative for pain.  Respiratory: Negative for shortness of breath.   Cardiovascular: Negative for chest pain.  Gastrointestinal: Positive for constipation. Negative for abdominal pain and blood in stool.  Genitourinary: Negative for dysuria.  Musculoskeletal: Negative for back pain.    Skin: Negative for rash.  Neurological: Negative for headaches.  All other systems reviewed and are negative.    Allergies  Review of patient's allergies indicates no known allergies.  Home Medications   Current Outpatient Rx  Name Route Sig Dispense Refill  . DOCUSATE SODIUM 250 MG PO CAPS Oral Take 1 capsule (250 mg total) by mouth daily. 10 capsule 0  . PRENATAL 27-0.8 MG PO TABS Oral Take 1 tablet by mouth daily.      BP 141/67  Pulse 74  Temp(Src) 98.3 F (36.8 C) (Oral)  Resp 22  SpO2 100%  LMP 11/10/2010  Physical Exam  Constitutional: She is oriented to person, place, and time. She appears well-developed and well-nourished.  HENT:  Head: Normocephalic and atraumatic.  Eyes: Conjunctivae and EOM are normal. Pupils are equal, round, and reactive to light.  Neck: Trachea normal. Neck supple. No thyromegaly present.  Cardiovascular: Normal rate, regular rhythm, S1 normal, S2 normal and normal pulses.     No systolic murmur is present   No diastolic murmur is present  Pulses:      Radial pulses are 2+ on the right side, and 2+ on the left side.  Pulmonary/Chest: Effort normal and breath sounds normal. She has no wheezes. She has no rhonchi. She has no rales. She exhibits no tenderness.  Abdominal: Soft. Normal appearance. There is no rebound, no guarding, no CVA tenderness and negative Murphy's sign.       Obese with gravid uterus, decreased  bowel sounds present  Musculoskeletal:       BLE:s Calves nontender, no cords or erythema, negative Homans sign  Neurological: She is alert and oriented to person, place, and time. She has normal strength. No cranial nerve deficit or sensory deficit. GCS eye subscore is 4. GCS verbal subscore is 5. GCS motor subscore is 6.  Skin: Skin is warm and dry. No rash noted. She is not diaphoretic.  Psychiatric: Her speech is normal.       Cooperative and appropriate    ED Course  Procedures (including critical care time)  Patient  developed some nausea and Phenergan provided. Colace with prescription provided   MDM   Clinical constipation. Patient educated on the importance of staying well-hydrated, maintaining fiber diet, and getting regular exercise.  She states understanding things and agrees to close followup in the clinic with her OB/GYN. Reliable historian and verbalizes understanding pregnancy and constipation precautions. She is stable for discharge home at this time        Sunnie Nielsen, MD 03/15/11 0600

## 2012-01-21 ENCOUNTER — Encounter (HOSPITAL_BASED_OUTPATIENT_CLINIC_OR_DEPARTMENT_OTHER): Payer: Self-pay

## 2012-01-21 ENCOUNTER — Emergency Department (HOSPITAL_BASED_OUTPATIENT_CLINIC_OR_DEPARTMENT_OTHER): Payer: Self-pay

## 2012-01-21 ENCOUNTER — Emergency Department (HOSPITAL_BASED_OUTPATIENT_CLINIC_OR_DEPARTMENT_OTHER)
Admission: EM | Admit: 2012-01-21 | Discharge: 2012-01-21 | Disposition: A | Discharge status: Home / Self Care | Payer: No Typology Code available for payment source | Attending: Emergency Medicine | Admitting: Emergency Medicine

## 2012-01-21 DIAGNOSIS — S8011XA Contusion of right lower leg, initial encounter: Secondary | ICD-10-CM

## 2012-01-21 DIAGNOSIS — S8010XA Contusion of unspecified lower leg, initial encounter: Secondary | ICD-10-CM | POA: Insufficient documentation

## 2012-01-21 DIAGNOSIS — Z8632 Personal history of gestational diabetes: Secondary | ICD-10-CM | POA: Insufficient documentation

## 2012-01-21 DIAGNOSIS — I10 Essential (primary) hypertension: Secondary | ICD-10-CM | POA: Insufficient documentation

## 2012-01-21 DIAGNOSIS — Y9289 Other specified places as the place of occurrence of the external cause: Secondary | ICD-10-CM | POA: Insufficient documentation

## 2012-01-21 DIAGNOSIS — Y9389 Activity, other specified: Secondary | ICD-10-CM | POA: Insufficient documentation

## 2012-01-21 DIAGNOSIS — F17200 Nicotine dependence, unspecified, uncomplicated: Secondary | ICD-10-CM | POA: Insufficient documentation

## 2012-01-21 MED ORDER — HYDROCODONE-ACETAMINOPHEN 5-325 MG PO TABS: 1.0000 | ORAL_TABLET | Freq: Four times a day (QID) | ORAL | Status: DC | PRN
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
## 2012-01-21 NOTE — ED Notes (Signed)
The patient is undressed from the waist down and in a gown.

## 2012-01-21 NOTE — ED Notes (Signed)
Patient transported to X-ray 

## 2012-02-07 ENCOUNTER — Ambulatory Visit: Payer: Medicaid Other | Admitting: Family Medicine

## 2012-04-11 ENCOUNTER — Emergency Department (HOSPITAL_BASED_OUTPATIENT_CLINIC_OR_DEPARTMENT_OTHER)
Admission: EM | Admit: 2012-04-11 | Discharge: 2012-04-11 | Disposition: A | Discharge status: Home / Self Care | Payer: Medicaid Other | Attending: Emergency Medicine | Admitting: Emergency Medicine

## 2012-04-11 ENCOUNTER — Emergency Department (HOSPITAL_BASED_OUTPATIENT_CLINIC_OR_DEPARTMENT_OTHER): Payer: Medicaid Other

## 2012-04-11 ENCOUNTER — Encounter (HOSPITAL_BASED_OUTPATIENT_CLINIC_OR_DEPARTMENT_OTHER): Payer: Self-pay | Admitting: *Deleted

## 2012-04-11 DIAGNOSIS — O9989 Other specified diseases and conditions complicating pregnancy, childbirth and the puerperium: Secondary | ICD-10-CM | POA: Insufficient documentation

## 2012-04-11 DIAGNOSIS — O99330 Smoking (tobacco) complicating pregnancy, unspecified trimester: Secondary | ICD-10-CM | POA: Insufficient documentation

## 2012-04-11 DIAGNOSIS — I10 Essential (primary) hypertension: Secondary | ICD-10-CM | POA: Insufficient documentation

## 2012-04-11 DIAGNOSIS — Z8632 Personal history of gestational diabetes: Secondary | ICD-10-CM | POA: Insufficient documentation

## 2012-04-11 DIAGNOSIS — R109 Unspecified abdominal pain: Secondary | ICD-10-CM | POA: Insufficient documentation

## 2012-04-11 DIAGNOSIS — Z79899 Other long term (current) drug therapy: Secondary | ICD-10-CM | POA: Insufficient documentation

## 2012-04-11 DIAGNOSIS — O200 Threatened abortion: Secondary | ICD-10-CM | POA: Insufficient documentation

## 2012-04-11 DIAGNOSIS — N949 Unspecified condition associated with female genital organs and menstrual cycle: Secondary | ICD-10-CM | POA: Insufficient documentation

## 2012-04-11 DIAGNOSIS — O219 Vomiting of pregnancy, unspecified: Secondary | ICD-10-CM | POA: Insufficient documentation

## 2012-04-11 DIAGNOSIS — O26859 Spotting complicating pregnancy, unspecified trimester: Secondary | ICD-10-CM | POA: Insufficient documentation

## 2012-04-11 DIAGNOSIS — R197 Diarrhea, unspecified: Secondary | ICD-10-CM | POA: Insufficient documentation

## 2012-04-11 DIAGNOSIS — Z3201 Encounter for pregnancy test, result positive: Secondary | ICD-10-CM | POA: Insufficient documentation

## 2012-04-11 LAB — CBC WITH DIFFERENTIAL/PLATELET
Basophils Relative: 0 % (ref 0–1)
Eosinophils Absolute: 0.1 10*3/uL (ref 0.0–0.7)
Hemoglobin: 13.6 g/dL (ref 12.0–15.0)
MCH: 26.8 pg (ref 26.0–34.0)
MCHC: 34.1 g/dL (ref 30.0–36.0)
Monocytes Absolute: 0.5 10*3/uL (ref 0.1–1.0)
Monocytes Relative: 7 % (ref 3–12)
Neutrophils Relative %: 66 % (ref 43–77)
RDW: 14.3 % (ref 11.5–15.5)

## 2012-04-11 LAB — URINE MICROSCOPIC-ADD ON

## 2012-04-11 LAB — COMPREHENSIVE METABOLIC PANEL
ALT: 7 U/L (ref 0–35)
Alkaline Phosphatase: 56 U/L (ref 39–117)
CO2: 23 mEq/L (ref 19–32)
Chloride: 103 mEq/L (ref 96–112)
GFR calc Af Amer: >90 mL/min (ref >90)
Glucose, Bld: 134 mg/dL — ABNORMAL HIGH (ref 70–99)
Potassium: 3.4 mEq/L — ABNORMAL LOW (ref 3.5–5.1)
Sodium: 136 mEq/L (ref 135–145)
Total Bilirubin: 0.2 mg/dL — ABNORMAL LOW (ref 0.3–1.2)
Total Protein: 7.4 g/dL (ref 6.0–8.3)

## 2012-04-11 LAB — URINALYSIS, ROUTINE W REFLEX MICROSCOPIC
Bilirubin Urine: NEGATIVE
Glucose, UA: NEGATIVE mg/dL
Ketones, ur: 15 mg/dL — AB
Nitrite: NEGATIVE
Specific Gravity, Urine: 1.031 — ABNORMAL HIGH (ref 1.005–1.030)
pH: 6.5 (ref 5.0–8.0)

## 2012-04-11 LAB — HCG, QUANTITATIVE, PREGNANCY: hCG, Beta Chain, Quant, S: 46563 m[IU]/mL — ABNORMAL HIGH (ref <5)

## 2012-04-11 MED ORDER — MORPHINE SULFATE 4 MG/ML IJ SOLN
4.0000 mg | Freq: Once | INTRAMUSCULAR | Status: AC
  Administered 2012-04-11: 4 mg via INTRAVENOUS
  Filled 2012-04-11: qty 1

## 2012-04-11 MED ORDER — SODIUM CHLORIDE 0.9 % IV SOLN
1000.0000 mL | Freq: Once | INTRAVENOUS | Status: AC
  Administered 2012-04-11: 1000 mL via INTRAVENOUS

## 2012-04-11 MED ORDER — ONDANSETRON 8 MG PO TBDP: 8.0000 mg | ORAL_TABLET | Freq: Three times a day (TID) | ORAL | Status: DC | PRN

## 2012-04-11 MED ORDER — ONDANSETRON HCL 4 MG/2ML IJ SOLN
4.0000 mg | Freq: Once | INTRAMUSCULAR | Status: AC
  Administered 2012-04-11: 4 mg via INTRAVENOUS
  Filled 2012-04-11: qty 2

## 2012-04-11 MED ORDER — SODIUM CHLORIDE 0.9 % IV SOLN
1000.0000 mL | INTRAVENOUS | Status: DC
  Administered 2012-04-11: 1000 mL via INTRAVENOUS

## 2012-04-11 MED ORDER — ACETAMINOPHEN 325 MG PO TABS: 650.0000 mg | ORAL_TABLET | Freq: Four times a day (QID) | ORAL | Status: DC | PRN

## 2012-04-11 NOTE — ED Notes (Signed)
States is [redacted] weeks pregnant is having lower abdominal pain and cramping also having nausea and vomiting  Reports spotting last night

## 2012-04-11 NOTE — ED Notes (Signed)
Patient dressed & ready for discharge, given apple juice.

## 2012-04-11 NOTE — ED Provider Notes (Signed)
History    CSN: [id number] Arrival date & time 04/11/12  1005 First MD Initiated Contact with Patient 04/11/12 1020      Chief Complaint  Patient presents with  . Abdominal Pain    HPI Pt is g5p3.  She has had vaginal spotting for the last three days.  SHe has been having intermittent pain as well.  It gets worse at night.  Today it was worse and she had more bleeding.  She has not been able to see an OB doctor yet.  She does have history of ectopic pregnancy but she does not recall what side.  The pain is sharp, and severe int he suprapubic region.  Past Medical History  Diagnosis Date  . Hypertension   . Gestational diabetes     Past Surgical History  Procedure Laterality Date  . Ectopic pregnancy surgery      History reviewed. No pertinent family history.  History  Substance Use Topics  . Smoking status: Current Every Day Smoker -- 0.50 packs/day    Types: Cigarettes  . Smokeless tobacco: Never Used  . Alcohol Use: No    OB History   Grav Para Term Preterm Abortions TAB SAB Ect Mult Living   2               Review of Systems  Constitutional: Negative for fever.  Respiratory: Negative for cough.   Gastrointestinal: Positive for nausea, vomiting and diarrhea.  Genitourinary: Positive for pelvic pain.  All other systems reviewed and are negative.    Allergies  Review of patient's allergies indicates no known allergies.  Home Medications   Current Outpatient Rx  Name  Route  Sig  Dispense  Refill  . acetaminophen (TYLENOL) 325 MG tablet   Oral   Take 2 tablets (650 mg total) by mouth every 6 (six) hours as needed for pain.   30 tablet   1   . HYDROcodone-acetaminophen (NORCO/VICODIN) 5-325 MG per tablet   Oral   Take 1 tablet by mouth every 6 (six) hours as needed for pain.   15 tablet   0   . ondansetron (ZOFRAN ODT) 8 MG disintegrating tablet   Oral   Take 1 tablet (8 mg total) by mouth every 8 (eight) hours as needed for nausea.   20 tablet    0   . Prenatal Vit-Fe Fumarate-FA (MULTIVITAMIN-PRENATAL) 27-0.8 MG TABS   Oral   Take 1 tablet by mouth daily.           BP 154/87  Pulse 73  Temp(Src) 98.8 F (37.1 C) (Oral)  Resp 22  Ht 5\' 5"  (1.651 m)  Wt 220 lb (99.791 kg)  BMI 36.61 kg/m2  SpO2 100%  LMP 02/10/2012  Physical Exam  Nursing note and vitals reviewed. Constitutional: She appears distressed.  HENT:  Head: Normocephalic and atraumatic.  Right Ear: External ear normal.  Left Ear: External ear normal.  Eyes: Conjunctivae are normal. Right eye exhibits no discharge. Left eye exhibits no discharge. No scleral icterus.  Neck: Neck supple. No tracheal deviation present.  Cardiovascular: Normal rate, regular rhythm and intact distal pulses.   Pulmonary/Chest: Effort normal and breath sounds normal. No stridor. No respiratory distress. She has no wheezes. She has no rales.  Abdominal: Soft. Bowel sounds are normal. She exhibits no distension. There is tenderness in the suprapubic area. There is no rebound and no guarding. Hernia confirmed negative in the right inguinal area.  Genitourinary: There is no rash on the right  labia. There is no rash on the left labia. Uterus is tender. Cervix exhibits no discharge. Right adnexum displays no mass. Left adnexum displays no mass. No vaginal discharge found.  Musculoskeletal: She exhibits no edema and no tenderness.  Neurological: She is alert. She has normal strength. No sensory deficit. Cranial nerve deficit:  no gross defecits noted. She exhibits normal muscle tone. She displays no seizure activity. Coordination normal.  Skin: Skin is warm and dry. No rash noted. She is not diaphoretic.  Psychiatric: She has a normal mood and affect.    ED Course  Procedures (including critical care time) Medications  0.9 %  sodium chloride infusion (0 mLs Intravenous Stopped 04/11/12 1149)    Followed by  0.9 %  sodium chloride infusion (1,000 mLs Intravenous New Bag/Given 04/11/12  1148)  ondansetron (ZOFRAN) injection 4 mg (4 mg Intravenous Given 04/11/12 1050)  morphine 4 MG/ML injection 4 mg (4 mg Intravenous Given 04/11/12 1050)  morphine 4 MG/ML injection 4 mg (4 mg Intravenous Given 04/11/12 1147)  ondansetron (ZOFRAN) injection 4 mg (4 mg Intravenous Given 04/11/12 1147)   Symptoms improved with treatment in emergency department Labs Reviewed  URINALYSIS, ROUTINE W REFLEX MICROSCOPIC - Abnormal; Notable for the following:    Color, Urine AMBER (*)    APPearance CLOUDY (*)    Specific Gravity, Urine 1.031 (*)    Hgb urine dipstick MODERATE (*)    Ketones, ur 15 (*)    Protein, ur 30 (*)    Leukocytes, UA SMALL (*)    All other components within normal limits  PREGNANCY, URINE - Abnormal; Notable for the following:    Preg Test, Ur POSITIVE (*)    All other components within normal limits  COMPREHENSIVE METABOLIC PANEL - Abnormal; Notable for the following:    Potassium 3.4 (*)    Glucose, Bld 134 (*)    Albumin 3.3 (*)    Total Bilirubin 0.2 (*)    GFR calc non Af Amer 81 (*)    All other components within normal limits  HCG, QUANTITATIVE, PREGNANCY - Abnormal; Notable for the following:    hCG, Beta Chain, Quant, S 16109 (*)    All other components within normal limits  URINE MICROSCOPIC-ADD ON - Abnormal; Notable for the following:    Squamous Epithelial / LPF MANY (*)    Bacteria, UA MANY (*)    All other components within normal limits  GC/CHLAMYDIA PROBE AMP  LIPASE, BLOOD  CBC WITH DIFFERENTIAL  RPR  ABO/RH   US Ob Comp Less 14 Wks  04/11/2012  *RADIOLOGY REPORT*  Clinical Data: Pregnant, nausea/vomiting, spotting  OBSTETRIC <14 WK [place] AND TRANSVAGINAL OB US  Technique:  Both transabdominal and transvaginal ultrasound examinations were performed for complete evaluation of the gestation as well as the maternal uterus, adnexal regions, and pelvic cul-de-sac.  Transvaginal technique was performed to assess early pregnancy.  Comparison:  None.   Intrauterine gestational sac:  Visualized/normal in shape. Yolk sac: Present Embryo: Present Cardiac Activity: Present Heart Rate: 173 bpm  CRL: 23.4  mm  9 w  0 d             [place] EDC: 11/14/2012  Maternal uterus/adnexae: Small subchronic hemorrhage.  Left ovary is within normal limits, measuring 1.9 x 2.8 x 1.6 cm.  Right ovary is within normal limits, measuring 2.9 x 2.4 x 2.0 cm.  No free fluid.  IMPRESSION: Single live intrauterine gestation with estimated gestational age [redacted] weeks 0 days by  crown-rump length.   Original Report Authenticated By: Charline Bills, M.D.    US Ob Transvaginal  04/11/2012  *RADIOLOGY REPORT*  Clinical Data: Pregnant, nausea/vomiting, spotting  OBSTETRIC <14 WK [place] AND TRANSVAGINAL OB US  Technique:  Both transabdominal and transvaginal ultrasound examinations were performed for complete evaluation of the gestation as well as the maternal uterus, adnexal regions, and pelvic cul-de-sac.  Transvaginal technique was performed to assess early pregnancy.  Comparison:  None.  Intrauterine gestational sac:  Visualized/normal in shape. Yolk sac: Present Embryo: Present Cardiac Activity: Present Heart Rate: 173 bpm  CRL: 23.4  mm  9 w  0 d             [place] EDC: 11/14/2012  Maternal uterus/adnexae: Small subchronic hemorrhage.  Left ovary is within normal limits, measuring 1.9 x 2.8 x 1.6 cm.  Right ovary is within normal limits, measuring 2.9 x 2.4 x 2.0 cm.  No free fluid.  IMPRESSION: Single live intrauterine gestation with estimated gestational age [redacted] weeks 0 days by crown-rump length.   Original Report Authenticated By: Charline Bills, M.D.      1. Threatened miscarriage       MDM  Patient does not have any active bleeding at this time. She does have a small subchorionic hemorrhage on the ultrasound. I suspect her symptoms are related to a threatened miscarriage. There does not appear to be evidence of a tubal pregnancy. I doubt appendicitis or cholecystitis at this time. I  discussed close followup with an OB/GYN Dr. Precautions and warning signs were also discussed.        Celene Kras, MD 04/11/12 1318

## 2012-04-30 ENCOUNTER — Emergency Department (HOSPITAL_BASED_OUTPATIENT_CLINIC_OR_DEPARTMENT_OTHER)
Admission: EM | Admit: 2012-04-30 | Discharge: 2012-04-30 | Disposition: A | Discharge status: Home / Self Care | Payer: Medicaid Other | Attending: Emergency Medicine | Admitting: Emergency Medicine

## 2012-04-30 ENCOUNTER — Encounter (HOSPITAL_BASED_OUTPATIENT_CLINIC_OR_DEPARTMENT_OTHER): Payer: Self-pay | Admitting: *Deleted

## 2012-04-30 DIAGNOSIS — O219 Vomiting of pregnancy, unspecified: Secondary | ICD-10-CM

## 2012-04-30 DIAGNOSIS — O99330 Smoking (tobacco) complicating pregnancy, unspecified trimester: Secondary | ICD-10-CM | POA: Insufficient documentation

## 2012-04-30 DIAGNOSIS — Z8632 Personal history of gestational diabetes: Secondary | ICD-10-CM | POA: Insufficient documentation

## 2012-04-30 DIAGNOSIS — O210 Mild hyperemesis gravidarum: Secondary | ICD-10-CM | POA: Insufficient documentation

## 2012-04-30 DIAGNOSIS — I10 Essential (primary) hypertension: Secondary | ICD-10-CM | POA: Insufficient documentation

## 2012-04-30 DIAGNOSIS — Z760 Encounter for issue of repeat prescription: Secondary | ICD-10-CM | POA: Insufficient documentation

## 2012-04-30 MED ORDER — ONDANSETRON HCL 8 MG PO TABS
ORAL_TABLET | ORAL | Status: AC
  Filled 2012-04-30: qty 1

## 2012-04-30 MED ORDER — ONDANSETRON 8 MG PO TBDP: ORAL_TABLET | ORAL | Status: DC

## 2012-04-30 NOTE — ED Notes (Signed)
Pt requesting refill zofran

## 2012-04-30 NOTE — ED Notes (Signed)
Pt states that she is here to see if she can get a Rx for Zofran for her pregnancy related nausea. Was seen at [facility] and given Phenergan, but it "makes her sleep all the time"

## 2012-04-30 NOTE — ED Provider Notes (Signed)
History  This chart was scribed for Gilda Crease, MD by Bennett Scrape, ED Scribe. This patient was seen in room MH02/MH02 and the patient's care was started at 8:14 PM.  CSN: [id number]  Arrival date & time 04/30/12  1956   First MD Initiated Contact with Patient 04/30/12 2014      Chief Complaint  Patient presents with  . Medication Refill    The history is provided by the patient. No language interpreter was used.    Jodi Porter is a 37 y.o. female who is 12 to [redacted] weeks pregnant who presents to the Emergency Department requesting a Zofran for ongoing daily emesis associated with the pregnancy. Pt was seen for the same and was given a phenergan but states. Pt states that she has called her PCP but was told to wait until her appointment on 05/12/12.    Past Medical History  Diagnosis Date  . Hypertension   . Gestational diabetes     Past Surgical History  Procedure Laterality Date  . Ectopic pregnancy surgery      History reviewed. No pertinent family history.  History  Substance Use Topics  . Smoking status: Current Every Day Smoker -- 0.50 packs/day    Types: Cigarettes  . Smokeless tobacco: Never Used  . Alcohol Use: No    OB History   Grav Para Term Preterm Abortions TAB SAB Ect Mult Living   2               Review of Systems  Allergies  Review of patient's allergies indicates no known allergies.  Home Medications   Current Outpatient Rx  Name  Route  Sig  Dispense  Refill  . acetaminophen (TYLENOL) 325 MG tablet   Oral   Take 2 tablets (650 mg total) by mouth every 6 (six) hours as needed for pain.   30 tablet   1   . HYDROcodone-acetaminophen (NORCO/VICODIN) 5-325 MG per tablet   Oral   Take 1 tablet by mouth every 6 (six) hours as needed for pain.   15 tablet   0   . ondansetron (ZOFRAN ODT) 8 MG disintegrating tablet   Oral   Take 1 tablet (8 mg total) by mouth every 8 (eight) hours as needed for nausea.   20 tablet    0   . Prenatal Vit-Fe Fumarate-FA (MULTIVITAMIN-PRENATAL) 27-0.8 MG TABS   Oral   Take 1 tablet by mouth daily.           Triage Vitals: BP 144/95  Pulse 78  Temp(Src) 99.2 F (37.3 C) (Oral)  Resp 20  Ht 5\' 5"  (1.651 m)  Wt 220 lb (99.791 kg)  BMI 36.61 kg/m2  SpO2 100%  LMP 02/10/2012  Physical Exam  Nursing note and vitals reviewed. Constitutional: She is oriented to person, place, and time. She appears well-developed and well-nourished. No distress.  HENT:  Head: Normocephalic and atraumatic.  Eyes: EOM are normal.  Neck: Neck supple. No tracheal deviation present.  Cardiovascular: Normal rate and regular rhythm.   Pulmonary/Chest: Effort normal and breath sounds normal. No respiratory distress.  Abdominal: Soft. There is no tenderness.  Musculoskeletal: Normal range of motion.  Neurological: She is alert and oriented to person, place, and time.  Skin: Skin is warm and dry.  Psychiatric: She has a normal mood and affect. Her behavior is normal.    ED Course  Procedures (including critical care time)  DIAGNOSTIC STUDIES: Oxygen Saturation is 100% on room air,  normal by my interpretation.    COORDINATION OF CARE: 8:17 PM- Discussed discharge plan which includes prescription with pt and pt agreed to plan. Also advised pt to follow up and pt agreed.  Labs Reviewed - No data to display No results found.   Diagnosis: Vomiting in pregnancy    MDM  She comes to the ER with complaints of nausea and vomiting due to early pregnancy. Patient was prescribed Phenergan but cannot take it because it makes her too sleepy. She reports that she was given Zofran ODT by another doctor and this helped her nausea and vomiting much better. She is asking that we give her a prescription for Zofran.  She denies abdominal pain. She has not had fever. There is no chest pain or shortness of breath. She has had vomiting without diarrhea. No blood in her emesis. Patient denies vaginal  bleeding. No concern for obstetric compromise.  I personally performed the services described in this documentation, which was scribed in my presence. The recorded information has been reviewed and is accurate.       Gilda Crease, MD 04/30/12 2030

## 2012-12-07 ENCOUNTER — Emergency Department (HOSPITAL_BASED_OUTPATIENT_CLINIC_OR_DEPARTMENT_OTHER): Payer: Medicaid Other

## 2012-12-07 ENCOUNTER — Emergency Department (HOSPITAL_BASED_OUTPATIENT_CLINIC_OR_DEPARTMENT_OTHER)
Admission: EM | Admit: 2012-12-07 | Discharge: 2012-12-07 | Disposition: A | Discharge status: Home / Self Care | Payer: Medicaid Other | Attending: Emergency Medicine | Admitting: Emergency Medicine

## 2012-12-07 ENCOUNTER — Encounter (HOSPITAL_BASED_OUTPATIENT_CLINIC_OR_DEPARTMENT_OTHER): Payer: Self-pay | Admitting: Emergency Medicine

## 2012-12-07 DIAGNOSIS — W108XXA Fall (on) (from) other stairs and steps, initial encounter: Secondary | ICD-10-CM | POA: Insufficient documentation

## 2012-12-07 DIAGNOSIS — I10 Essential (primary) hypertension: Secondary | ICD-10-CM | POA: Insufficient documentation

## 2012-12-07 DIAGNOSIS — S8990XA Unspecified injury of unspecified lower leg, initial encounter: Secondary | ICD-10-CM | POA: Insufficient documentation

## 2012-12-07 DIAGNOSIS — Y9389 Activity, other specified: Secondary | ICD-10-CM | POA: Insufficient documentation

## 2012-12-07 DIAGNOSIS — S8991XA Unspecified injury of right lower leg, initial encounter: Secondary | ICD-10-CM

## 2012-12-07 DIAGNOSIS — F17200 Nicotine dependence, unspecified, uncomplicated: Secondary | ICD-10-CM | POA: Insufficient documentation

## 2012-12-07 DIAGNOSIS — W010XXA Fall on same level from slipping, tripping and stumbling without subsequent striking against object, initial encounter: Secondary | ICD-10-CM | POA: Insufficient documentation

## 2012-12-07 DIAGNOSIS — Y929 Unspecified place or not applicable: Secondary | ICD-10-CM | POA: Insufficient documentation

## 2012-12-07 MED ORDER — ACETAMINOPHEN 500 MG PO TABS
1000.0000 mg | ORAL_TABLET | Freq: Once | ORAL | Status: AC
  Administered 2012-12-07: 1000 mg via ORAL
  Filled 2012-12-07: qty 2

## 2012-12-07 NOTE — ED Notes (Signed)
Pedal pulses noted to be WNL R and L.

## 2012-12-07 NOTE — ED Provider Notes (Signed)
CSN: [id number]     Arrival date & time 12/07/12  1701 History   First MD Initiated Contact with Patient 12/07/12 1712     Chief Complaint  Patient presents with  . Fall   (Consider location/radiation/quality/duration/timing/severity/associated sxs/prior Treatment) HPI Comments: 37 year old female presents with right lower leg pain after a fall yesterday. She states she was up and her sister move and tripped on the last 2-3 steps. She lay on the right side of her body. She is mostly feeling pain in her leg. It starts in her lower leg just above her ankle and seems to radiate up. She's not had trouble walking except that exacerbates her pain. She's tried some ibuprofen with minimal relief. She's not noticed much swelling. She denies any right-sided arm or shoulder pain, chest pain, abdominal pain, headache or neck pain. Denied her head or loose consciousness. No other injury except for the lower leg.   Past Medical History  Diagnosis Date  . Hypertension   . Gestational diabetes    Past Surgical History  Procedure Laterality Date  . Ectopic pregnancy surgery     No family history on file. History  Substance Use Topics  . Smoking status: Current Every Day Smoker -- 0.50 packs/day    Types: Cigarettes  . Smokeless tobacco: Never Used  . Alcohol Use: No   OB History   Grav Para Term Preterm Abortions TAB SAB Ect Mult Living   2              Review of Systems  Respiratory: Negative for shortness of breath.   Cardiovascular: Negative for chest pain.  Gastrointestinal: Negative for abdominal pain.  Musculoskeletal: Negative for back pain, joint swelling and neck pain.  Neurological: Negative for weakness, numbness and headaches.  All other systems reviewed and are negative.    Allergies  Review of patient's allergies indicates no known allergies.  Home Medications   Current Outpatient Rx  Name  Route  Sig  Dispense  Refill  . acetaminophen (TYLENOL) 325 MG tablet   Oral  Take 2 tablets (650 mg total) by mouth every 6 (six) hours as needed for pain.   30 tablet   1   . HYDROcodone-acetaminophen (NORCO/VICODIN) 5-325 MG per tablet   Oral   Take 1 tablet by mouth every 6 (six) hours as needed for pain.   15 tablet   0   . ondansetron (ZOFRAN ODT) 8 MG disintegrating tablet   Oral   Take 1 tablet (8 mg total) by mouth every 8 (eight) hours as needed for nausea.   20 tablet   0   . ondansetron (ZOFRAN ODT) 8 MG disintegrating tablet      8mg  ODT q4 hours prn nausea   20 tablet   2   . Prenatal Vit-Fe Fumarate-FA (MULTIVITAMIN-PRENATAL) 27-0.8 MG TABS   Oral   Take 1 tablet by mouth daily.          BP 185/105  Pulse 88  Temp(Src) 98.6 F (37 C) (Oral)  Resp 18  Ht 5\' 5"  (1.651 m)  Wt 226 lb (102.513 kg)  BMI 37.61 kg/m2  SpO2 100%  LMP 12/04/2012  Breastfeeding? Unknown Physical Exam  Nursing note and vitals reviewed. Constitutional: She is oriented to person, place, and time. She appears well-developed and well-nourished. No distress.  HENT:  Head: Normocephalic and atraumatic.  Right Ear: External ear normal.  Left Ear: External ear normal.  Nose: Nose normal.  Mouth/Throat: Oropharynx is clear and moist.  Neck: Neck supple.  Cardiovascular: Normal rate, regular rhythm, normal heart sounds and intact distal pulses.   Pulses:      Dorsalis pedis pulses are 2+ on the right side, and 2+ on the left side.  Pulmonary/Chest: Effort normal and breath sounds normal.  Abdominal: Soft. There is no tenderness.  Musculoskeletal:       Right hip: She exhibits normal range of motion and no tenderness.       Right knee: No tenderness found.       Right ankle: No tenderness.       Right upper leg: She exhibits no tenderness.       Right lower leg: She exhibits tenderness (a few centimeters above the ankle) and bony tenderness. She exhibits no swelling and no edema.  Neurological: She is alert and oriented to person, place, and time.  Skin:  Skin is warm and dry. No pallor.    ED Course  Procedures (including critical care time) Labs Review Labs Reviewed - No data to display Imaging Review Dg Tibia/fibula Right  12/07/2012   CLINICAL DATA:  Fall. Right leg injury.  EXAM: RIGHT TIBIA AND FIBULA - 2 VIEW  COMPARISON:  01/21/2012. Marland Kitchen  FINDINGS: Old Osgood-Schlatter disease is present. There is no fracture. Soft tissues appear within normal limits.  IMPRESSION: Negative.   Electronically Signed   By: Andreas Newport M.D.   On: 12/07/2012 17:47    EKG Interpretation   None       MDM   1. Right leg injury, initial encounter    Exam consistent with a mild sprain and/or contusion. Patient is able to ambulate. Will discharge with Motrin, Tylenol, and ice. Weightbearing as tolerated.    Audree Camel, MD 12/07/12 [phone]

## 2012-12-07 NOTE — ED Notes (Signed)
Yesterday she fell down 2 steps while she was helping her sister move and carry boxes. C.o pain on the right side of her body.

## 2013-02-09 ENCOUNTER — Emergency Department (HOSPITAL_BASED_OUTPATIENT_CLINIC_OR_DEPARTMENT_OTHER)
Admission: EM | Admit: 2013-02-09 | Discharge: 2013-02-09 | Disposition: A | Discharge status: Home / Self Care | Payer: Medicaid Other | Attending: Emergency Medicine | Admitting: Emergency Medicine

## 2013-02-09 ENCOUNTER — Encounter (HOSPITAL_BASED_OUTPATIENT_CLINIC_OR_DEPARTMENT_OTHER): Payer: Self-pay | Admitting: Emergency Medicine

## 2013-02-09 DIAGNOSIS — L0291 Cutaneous abscess, unspecified: Secondary | ICD-10-CM

## 2013-02-09 DIAGNOSIS — L03319 Cellulitis of trunk, unspecified: Principal | ICD-10-CM

## 2013-02-09 DIAGNOSIS — I10 Essential (primary) hypertension: Secondary | ICD-10-CM | POA: Insufficient documentation

## 2013-02-09 DIAGNOSIS — F17200 Nicotine dependence, unspecified, uncomplicated: Secondary | ICD-10-CM | POA: Insufficient documentation

## 2013-02-09 DIAGNOSIS — Z8632 Personal history of gestational diabetes: Secondary | ICD-10-CM | POA: Insufficient documentation

## 2013-02-09 DIAGNOSIS — Z79899 Other long term (current) drug therapy: Secondary | ICD-10-CM | POA: Insufficient documentation

## 2013-02-09 DIAGNOSIS — L02219 Cutaneous abscess of trunk, unspecified: Secondary | ICD-10-CM | POA: Insufficient documentation

## 2013-02-09 MED ORDER — DOXYCYCLINE HYCLATE 100 MG PO CAPS: 100.0000 mg | ORAL_CAPSULE | Freq: Two times a day (BID) | ORAL | Status: DC

## 2013-02-09 MED ORDER — HYDROCODONE-ACETAMINOPHEN 5-325 MG PO TABS: 2.0000 | ORAL_TABLET | ORAL | Status: DC | PRN

## 2013-02-09 NOTE — ED Notes (Signed)
Boil in right groin x 2 days.

## 2013-02-09 NOTE — ED Notes (Signed)
Swelling, redness and pain to right groin.

## 2013-02-09 NOTE — Discharge Instructions (Signed)
Remove 1 inch of gauze each day until all the gauze is removed. After the gauze is removed start daily soaks and gentle massage.   Abscess An abscess is an infected area that contains a collection of pus and debris.It can occur in almost any part of the body. An abscess is also known as a furuncle or boil. CAUSES  An abscess occurs when tissue gets infected. This can occur from blockage of oil or sweat glands, infection of hair follicles, or a minor injury to the skin. As the body tries to fight the infection, pus collects in the area and creates pressure under the skin. This pressure causes pain. People with weakened immune systems have difficulty fighting infections and get certain abscesses more often.  SYMPTOMS Usually an abscess develops on the skin and becomes a painful mass that is red, warm, and tender. If the abscess forms under the skin, you may feel a moveable soft area under the skin. Some abscesses break open (rupture) on their own, but most will continue to get worse without care. The infection can spread deeper into the body and eventually into the bloodstream, causing you to feel ill.  DIAGNOSIS  Your caregiver will take your medical history and perform a physical exam. A sample of fluid may also be taken from the abscess to determine what is causing your infection. TREATMENT  Your caregiver may prescribe antibiotic medicines to fight the infection. However, taking antibiotics alone usually does not cure an abscess. Your caregiver may need to make a small cut (incision) in the abscess to drain the pus. In some cases, gauze is packed into the abscess to reduce pain and to continue draining the area. HOME CARE INSTRUCTIONS   Only take over-the-counter or prescription medicines for pain, discomfort, or fever as directed by your caregiver.  If you were prescribed antibiotics, take them as directed. Finish them even if you start to feel better.  If gauze is used, follow your  caregiver's directions for changing the gauze.  To avoid spreading the infection:  Keep your draining abscess covered with a bandage.  Wash your hands well.  Do not share personal care items, towels, or whirlpools with others.  Avoid skin contact with others.  Keep your skin and clothes clean around the abscess.  Keep all follow-up appointments as directed by your caregiver. SEEK MEDICAL CARE IF:   You have increased pain, swelling, redness, fluid drainage, or bleeding.  You have muscle aches, chills, or a general ill feeling.  You have a fever. MAKE SURE YOU:   Understand these instructions.  Will watch your condition.  Will get help right away if you are not doing well or get worse. Document Released: 10/28/2004 Document Revised: 07/20/2011 Document Reviewed: 04/02/2011 [facility] Patient Information 2014 [place].

## 2013-02-09 NOTE — ED Provider Notes (Signed)
CSN: [id number]     Arrival date & time 02/09/13  0109 History   First MD Initiated Contact with Patient 02/09/13 [phone]     Chief Complaint  Patient presents with  . Recurrent Skin Infections    HPI  Patient presents with a sore swollen bump near her groin. "Like the another abscess". She's had previous breast abscesses that she's had an incision, drainage, and packing 4. Has been present for several days. She is using warm compresses. It has not drained. The surrounding skin is become red and painful.  Past Medical History  Diagnosis Date  . Hypertension   . Gestational diabetes    Past Surgical History  Procedure Laterality Date  . Ectopic pregnancy surgery     No family history on file. History  Substance Use Topics  . Smoking status: Current Every Day Smoker -- 0.50 packs/day    Types: Cigarettes  . Smokeless tobacco: Never Used  . Alcohol Use: No   OB History   Grav Para Term Preterm Abortions TAB SAB Ect Mult Living   2              Review of Systems  Constitutional: Negative for fever, chills, diaphoresis, appetite change and fatigue.  HENT: Negative for mouth sores, sore throat and trouble swallowing.   Eyes: Negative for visual disturbance.  Respiratory: Negative for cough, chest tightness, shortness of breath and wheezing.   Cardiovascular: Negative for chest pain.  Gastrointestinal: Negative for nausea, vomiting, abdominal pain, diarrhea and abdominal distention.  Endocrine: Negative for polydipsia, polyphagia and polyuria.  Genitourinary: Negative for dysuria, frequency and hematuria.  Musculoskeletal: Negative for gait problem.  Skin: Negative for color change, pallor and rash.       Abscess in the right groin adjacent to the mons pubis  Neurological: Negative for dizziness, syncope, light-headedness and headaches.  Hematological: Does not bruise/bleed easily.  Psychiatric/Behavioral: Negative for behavioral problems and confusion.    Allergies  Review of  patient's allergies indicates no known allergies.  Home Medications   Current Outpatient Rx  Name  Route  Sig  Dispense  Refill  . acetaminophen (TYLENOL) 325 MG tablet   Oral   Take 2 tablets (650 mg total) by mouth every 6 (six) hours as needed for pain.   30 tablet   1   . doxycycline (VIBRAMYCIN) 100 MG capsule   Oral   Take 1 capsule (100 mg total) by mouth 2 (two) times daily.   20 capsule   0   . HYDROcodone-acetaminophen (NORCO/VICODIN) 5-325 MG per tablet   Oral   Take 1 tablet by mouth every 6 (six) hours as needed for pain.   15 tablet   0   . HYDROcodone-acetaminophen (NORCO/VICODIN) 5-325 MG per tablet   Oral   Take 2 tablets by mouth every 4 (four) hours as needed.   10 tablet   0   . ondansetron (ZOFRAN ODT) 8 MG disintegrating tablet   Oral   Take 1 tablet (8 mg total) by mouth every 8 (eight) hours as needed for nausea.   20 tablet   0   . ondansetron (ZOFRAN ODT) 8 MG disintegrating tablet      8mg  ODT q4 hours prn nausea   20 tablet   2   . Prenatal Vit-Fe Fumarate-FA (MULTIVITAMIN-PRENATAL) 27-0.8 MG TABS   Oral   Take 1 tablet by mouth daily.          BP 159/91  Pulse 66  Temp(Src) 98.9  F (37.2 C) (Oral)  Resp 18  Ht 5\' 1"  (1.549 m)  Wt 220 lb (99.791 kg)  BMI 41.59 kg/m2  SpO2 100%  LMP 01/04/2013  Breastfeeding? No Physical Exam  Abdominal:      ED Course  INCISION AND DRAINAGE Date/Time: 02/09/2013 9:28 AM Performed by: Tanna Furry Authorized by: Tanna Furry Consent: Verbal consent obtained. written consent not obtained. Risks and benefits: risks, benefits and alternatives were discussed Consent given by: patient Patient understanding: patient states understanding of the procedure being performed Type: abscess Body area: trunk Location details: abdomen Anesthesia: local infiltration Local anesthetic: lidocaine 1% without epinephrine Anesthetic total: 2 ml Patient sedated: no Scalpel size: 11 Incision type:  single straight Complexity: simple Drainage: purulent Drainage amount: moderate Packing material: 1/4 in iodoform gauze Patient tolerance: Patient tolerated the procedure well with no immediate complications.   (including critical care time) Labs Review Labs Reviewed - No data to display Imaging Review No results found.  EKG Interpretation   None       MDM   1. Skin abscess    Packing placed. Patient remove 1 inch per day. Given prescription doxycycline Vicodin. Recheck here if any difficulties.    Tanna Furry, MD 02/09/13 [phone]

## 2013-11-06 ENCOUNTER — Emergency Department (HOSPITAL_BASED_OUTPATIENT_CLINIC_OR_DEPARTMENT_OTHER): Payer: Medicaid Other

## 2013-11-06 ENCOUNTER — Encounter (HOSPITAL_BASED_OUTPATIENT_CLINIC_OR_DEPARTMENT_OTHER): Payer: Self-pay | Admitting: Emergency Medicine

## 2013-11-06 ENCOUNTER — Emergency Department (HOSPITAL_BASED_OUTPATIENT_CLINIC_OR_DEPARTMENT_OTHER)
Admission: EM | Admit: 2013-11-06 | Discharge: 2013-11-06 | Disposition: A | Discharge status: Home / Self Care | Payer: Medicaid Other | Attending: Emergency Medicine | Admitting: Emergency Medicine

## 2013-11-06 DIAGNOSIS — Z720 Tobacco use: Secondary | ICD-10-CM | POA: Insufficient documentation

## 2013-11-06 DIAGNOSIS — Z792 Long term (current) use of antibiotics: Secondary | ICD-10-CM | POA: Insufficient documentation

## 2013-11-06 DIAGNOSIS — I10 Essential (primary) hypertension: Secondary | ICD-10-CM | POA: Diagnosis not present

## 2013-11-06 DIAGNOSIS — Z8632 Personal history of gestational diabetes: Secondary | ICD-10-CM | POA: Insufficient documentation

## 2013-11-06 DIAGNOSIS — J40 Bronchitis, not specified as acute or chronic: Secondary | ICD-10-CM | POA: Insufficient documentation

## 2013-11-06 DIAGNOSIS — R0981 Nasal congestion: Secondary | ICD-10-CM | POA: Diagnosis present

## 2013-11-06 MED ORDER — AEROCHAMBER PLUS FLO-VU MEDIUM MISC
1.0000 | Freq: Once | Status: AC
  Administered 2013-11-06: 1
  Filled 2013-11-06: qty 1

## 2013-11-06 MED ORDER — PREDNISONE 20 MG PO TABS: ORAL_TABLET | ORAL | Status: DC

## 2013-11-06 MED ORDER — LISINOPRIL 10 MG PO TABS
10.0000 mg | ORAL_TABLET | Freq: Once | ORAL | Status: AC
  Administered 2013-11-06: 10 mg via ORAL
  Filled 2013-11-06: qty 1

## 2013-11-06 MED ORDER — IPRATROPIUM-ALBUTEROL 0.5-2.5 (3) MG/3ML IN SOLN
3.0000 mL | RESPIRATORY_TRACT | Status: DC
  Administered 2013-11-06: 3 mL via RESPIRATORY_TRACT
  Filled 2013-11-06: qty 3

## 2013-11-06 MED ORDER — ALBUTEROL SULFATE HFA 108 (90 BASE) MCG/ACT IN AERS
2.0000 | INHALATION_SPRAY | Freq: Once | RESPIRATORY_TRACT | Status: AC
  Administered 2013-11-06: 2 via RESPIRATORY_TRACT
  Filled 2013-11-06: qty 6.7

## 2013-11-06 MED ORDER — GUAIFENESIN 100 MG/5ML PO LIQD: 100.0000 mg | ORAL | Status: DC | PRN

## 2013-11-06 NOTE — Discharge Instructions (Signed)
Acute Bronchitis Bronchitis is when the airways that extend from the windpipe into the lungs get red, puffy, and painful (inflamed). Bronchitis often causes thick spit (mucus) to develop. This leads to a cough. A cough is the most common symptom of bronchitis. In acute bronchitis, the condition usually begins suddenly and goes away over time (usually in 2 weeks). Smoking, allergies, and asthma can make bronchitis worse. Repeated episodes of bronchitis may cause more lung problems. HOME CARE  Rest.  Drink enough fluids to keep your pee (urine) clear or pale yellow (unless you need to limit fluids as told by your doctor).  Only take over-the-counter or prescription medicines as told by your doctor.  Avoid smoking and secondhand smoke. These can make bronchitis worse. If you are a smoker, think about using nicotine gum or skin patches. Quitting smoking will help your lungs heal faster.  Reduce the chance of getting bronchitis again by:  Washing your hands often.  Avoiding people with cold symptoms.  Trying not to touch your hands to your mouth, nose, or eyes.  Follow up with your doctor as told. GET HELP IF: Your symptoms do not improve after 1 week of treatment. Symptoms include:  Cough.  Fever.  Coughing up thick spit.  Body aches.  Chest congestion.  Chills.  Shortness of breath.  Sore throat. GET HELP RIGHT AWAY IF:   You have an increased fever.  You have chills.  You have severe shortness of breath.  You have bloody thick spit (sputum).  You throw up (vomit) often.  You lose too much body fluid (dehydration).  You have a severe headache.  You faint. MAKE SURE YOU:   Understand these instructions.  Will watch your condition.  Will get help right away if you are not doing well or get worse. Document Released: 07/07/2007 Document Revised: 09/20/2012 Document Reviewed: 07/11/2012 [facility] Patient Information 2015 [place], [place]. This information is not  intended to replace advice given to you by your health care provider. Make sure you discuss any questions you have with your health care provider.   How to Use an Inhaler Using your inhaler correctly is very important. Good technique will make sure that the medicine reaches your lungs.  HOW TO USE AN INHALER: 1. Take the cap off the inhaler. 2. If this is the first time using your inhaler, you need to prime it. Shake the inhaler for 5 seconds. Release four puffs into the air, away from your face. Ask your doctor for help if you have questions. 3. Shake the inhaler for 5 seconds. 4. Turn the inhaler so the bottle is above the mouthpiece. 5. Put your pointer finger on top of the bottle. Your thumb holds the bottom of the inhaler. 6. Open your mouth. 7. Either hold the inhaler away from your mouth (the width of 2 fingers) or place your lips tightly around the mouthpiece. Ask your doctor which way to use your inhaler. 8. Breathe out as much air as possible. 9. Breathe in and push down on the bottle 1 time to release the medicine. You will feel the medicine go in your mouth and throat. 10. Continue to take a deep breath in very slowly. Try to fill your lungs. 11. After you have breathed in completely, hold your breath for 10 seconds. This will help the medicine to settle in your lungs. If you cannot hold your breath for 10 seconds, hold it for as long as you can before you breathe out. 12. Breathe out  slowly, through pursed lips. Whistling is an example of pursed lips. 13. If your doctor has told you to take more than 1 puff, wait at least 15-30 seconds between puffs. This will help you get the best results from your medicine. Do not use the inhaler more than your doctor tells you to. 14. Put the cap back on the inhaler. 15. Follow the directions from your doctor or from the inhaler package about cleaning the inhaler. If you use more than one inhaler, ask your doctor which inhalers to use and what  order to use them in. Ask your doctor to help you figure out when you will need to refill your inhaler.  If you use a steroid inhaler, always rinse your mouth with water after your last puff, gargle and spit out the water. Do not swallow the water. GET HELP IF:  The inhaler medicine only partially helps to stop wheezing or shortness of breath.  You are having trouble using your inhaler.  You have some increase in thick spit (phlegm). GET HELP RIGHT AWAY IF:  The inhaler medicine does not help your wheezing or shortness of breath or you have tightness in your chest.  You have dizziness, headaches, or fast heart rate.  You have chills, fever, or night sweats.  You have a large increase of thick spit, or your thick spit is bloody. MAKE SURE YOU:   Understand these instructions.  Will watch your condition.  Will get help right away if you are not doing well or get worse. Document Released: 10/28/2007 Document Revised: 11/08/2012 Document Reviewed: 08/17/2012 [facility] Patient Information 2015 [place], [place]. This information is not intended to replace advice given to you by your health care provider. Make sure you discuss any questions you have with your health care provider.

## 2013-11-06 NOTE — ED Notes (Signed)
Pt c/o URI symptoms x 3 days Husband recent DX pneumonia

## 2013-11-06 NOTE — ED Provider Notes (Signed)
CSN: [id number]     Arrival date & time 11/06/13  1418 History   First MD Initiated Contact with Patient 11/06/13 1503     Chief Complaint  Patient presents with  . URI     (Consider location/radiation/quality/duration/timing/severity/associated sxs/prior Treatment) Patient is a 38 y.o. female presenting with URI. The history is provided by the patient. No language interpreter was used.  URI Presenting symptoms: congestion, cough, fatigue and rhinorrhea   Presenting symptoms: no fever and no sore throat   Presenting symptoms comment:  SOB Congestion:    Location:  Nasal and chest   Interferes with sleep: yes     Interferes with eating/drinking: no   Cough:    Cough characteristics:  Productive   Sputum characteristics:  Yellow   Severity:  Moderate   Onset quality:  Gradual   Duration:  1 week   Timing:  Constant   Progression:  Worsening   Chronicity:  New Severity:  Moderate Onset quality:  Gradual Duration:  1 week Timing:  Constant Progression:  Worsening Chronicity:  New Relieved by:  Nothing Worsened by:  Nothing tried Ineffective treatments:  None tried Associated symptoms: wheezing   Associated symptoms: no arthralgias, no headaches and no neck pain   Associated symptoms comment:  SOB  Risk factors comment:  Tobacco use   Past Medical History  Diagnosis Date  . Hypertension   . Gestational diabetes    Past Surgical History  Procedure Laterality Date  . Ectopic pregnancy surgery     History reviewed. No pertinent family history. History  Substance Use Topics  . Smoking status: Current Every Day Smoker -- 0.50 packs/day    Types: Cigarettes  . Smokeless tobacco: Never Used  . Alcohol Use: No   OB History   Grav Para Term Preterm Abortions TAB SAB Ect Mult Living   2              Review of Systems  Constitutional: Positive for fatigue. Negative for fever, chills, diaphoresis, activity change and appetite change.  HENT: Positive for congestion  and rhinorrhea. Negative for facial swelling and sore throat.   Eyes: Negative for photophobia and discharge.  Respiratory: Positive for cough, shortness of breath and wheezing. Negative for chest tightness.   Cardiovascular: Negative for chest pain, palpitations and leg swelling.  Gastrointestinal: Negative for nausea, vomiting, abdominal pain and diarrhea.  Endocrine: Negative for polydipsia and polyuria.  Genitourinary: Negative for dysuria, frequency, difficulty urinating and pelvic pain.  Musculoskeletal: Negative for arthralgias, back pain, neck pain and neck stiffness.  Skin: Negative for color change and wound.  Allergic/Immunologic: Negative for immunocompromised state.  Neurological: Negative for facial asymmetry, weakness, numbness and headaches.  Hematological: Does not bruise/bleed easily.  Psychiatric/Behavioral: Negative for confusion and agitation.      Allergies  Review of patient's allergies indicates no known allergies.  Home Medications   Prior to Admission medications   Medication Sig Start Date End Date Taking? Authorizing Provider  acetaminophen (TYLENOL) 325 MG tablet Take 2 tablets (650 mg total) by mouth every 6 (six) hours as needed for pain. 04/11/12   Dorie Rank, MD  doxycycline (VIBRAMYCIN) 100 MG capsule Take 1 capsule (100 mg total) by mouth 2 (two) times daily. 02/09/13   Tanna Furry, MD  guaiFENesin (ROBITUSSIN) 100 MG/5ML liquid Take 5-10 mLs (100-200 mg total) by mouth every 4 (four) hours as needed for cough. 11/06/13   Ernestina Patches, MD  HYDROcodone-acetaminophen (NORCO/VICODIN) 5-325 MG per tablet Take 1 tablet by  mouth every 6 (six) hours as needed for pain. 01/21/12   Blanchie Dessert, MD  HYDROcodone-acetaminophen (NORCO/VICODIN) 5-325 MG per tablet Take 2 tablets by mouth every 4 (four) hours as needed. 02/09/13   Tanna Furry, MD  ondansetron (ZOFRAN ODT) 8 MG disintegrating tablet Take 1 tablet (8 mg total) by mouth every 8 (eight) hours as needed for  nausea. 04/11/12   Dorie Rank, MD  ondansetron (ZOFRAN ODT) 8 MG disintegrating tablet 8mg  ODT q4 hours prn nausea 04/30/12   Orpah Greek, MD  predniSONE (DELTASONE) 20 MG tablet 3 tabs po day one, then 2 po daily x 4 days 11/06/13   Ernestina Patches, MD  Prenatal Vit-Fe Fumarate-FA (MULTIVITAMIN-PRENATAL) 27-0.8 MG TABS Take 1 tablet by mouth daily.    Historical Provider, MD   BP 203/120  Pulse 85  Temp(Src) 98.7 F (37.1 C)  Resp 16  Ht 5\' 5"  (1.651 m)  Wt 220 lb (99.791 kg)  BMI 36.61 kg/m2  SpO2 100% Physical Exam  Constitutional: She is oriented to person, place, and time. She appears well-developed and well-nourished. No distress.  HENT:  Head: Normocephalic and atraumatic.  Mouth/Throat: No oropharyngeal exudate.  Eyes: Pupils are equal, round, and reactive to light.  Neck: Normal range of motion. Neck supple.  Cardiovascular: Normal rate, regular rhythm and normal heart sounds.  Exam reveals no gallop and no friction rub.   No murmur heard. Pulmonary/Chest: Effort normal. No respiratory distress. She has wheezes in the right upper field, the right middle field, the right lower field, the left middle field and the left lower field. She has no rales.  Abdominal: Soft. Bowel sounds are normal. She exhibits no distension and no mass. There is no tenderness. There is no rebound and no guarding.  Musculoskeletal: Normal range of motion. She exhibits no edema and no tenderness.  Neurological: She is alert and oriented to person, place, and time.  Skin: Skin is warm and dry.  Psychiatric: She has a normal mood and affect.    ED Course  Procedures (including critical care time) Labs Review Labs Reviewed - No data to display  Imaging Review Dg Chest 2 View  11/06/2013   CLINICAL DATA:  Productive cough. Shortness of breath and wheezing. Chest congestion.  EXAM: CHEST  2 VIEW  COMPARISON:  None.  FINDINGS: There is prominent peribronchial thickening consistent with  bronchitis. No consolidative infiltrates or effusions. Heart size and pulmonary vascularity are normal. No significant osseous abnormality.  IMPRESSION: Bronchitic changes.   Electronically Signed   By: Rozetta Nunnery M.D.   On: 11/06/2013 16:05     EKG Interpretation None      MDM   Final diagnoses:  Bronchitis    Pt is a 38 y.o. female with Pmhx as above who presents with about 1 week of productive cough, nasal and chest congestion, myalgias, and for the past 24 hours shortness of breath. She is sick contacts at home with husband who is treated for pneumonia about 2 weeks ago. On physical exam she is hypertensive (she has not taken her lisinopril for 2 days). Vital signs are otherwise stable. She has diffuse wheezing throughout on pulmonary exam and is coughing frequently. Duo-neb ordered. Chest x-ray with bronchitic changes.    Pt feeling improved after Duo-neb, still has some scattered wheezing. Will d/c home with 5 d course of prednisone, albuterol MDI PRN and mucinex.  Return precautions given for new or worsening symptoms including worsening SOB, fever.  Ernestina Patches, MD 11/06/13 [phone]

## 2013-12-03 ENCOUNTER — Encounter (HOSPITAL_BASED_OUTPATIENT_CLINIC_OR_DEPARTMENT_OTHER): Payer: Self-pay | Admitting: Emergency Medicine

## 2014-02-02 ENCOUNTER — Encounter (HOSPITAL_BASED_OUTPATIENT_CLINIC_OR_DEPARTMENT_OTHER): Payer: Self-pay | Admitting: Emergency Medicine

## 2014-02-02 DIAGNOSIS — J029 Acute pharyngitis, unspecified: Secondary | ICD-10-CM | POA: Diagnosis present

## 2014-02-02 DIAGNOSIS — Z720 Tobacco use: Secondary | ICD-10-CM | POA: Diagnosis not present

## 2014-02-02 DIAGNOSIS — I10 Essential (primary) hypertension: Secondary | ICD-10-CM | POA: Insufficient documentation

## 2014-02-02 DIAGNOSIS — Z8632 Personal history of gestational diabetes: Secondary | ICD-10-CM | POA: Diagnosis not present

## 2014-02-02 DIAGNOSIS — J209 Acute bronchitis, unspecified: Secondary | ICD-10-CM | POA: Diagnosis not present

## 2014-02-02 DIAGNOSIS — Z792 Long term (current) use of antibiotics: Secondary | ICD-10-CM | POA: Diagnosis not present

## 2014-02-02 NOTE — ED Notes (Signed)
Pt reports tx by PMD with antibiotic for pneumonia and feels it is returning

## 2014-02-03 ENCOUNTER — Emergency Department (HOSPITAL_BASED_OUTPATIENT_CLINIC_OR_DEPARTMENT_OTHER)
Admission: EM | Admit: 2014-02-03 | Discharge: 2014-02-03 | Disposition: A | Discharge status: Home / Self Care | Payer: Medicaid Other | Attending: Emergency Medicine | Admitting: Emergency Medicine

## 2014-02-03 ENCOUNTER — Emergency Department (HOSPITAL_BASED_OUTPATIENT_CLINIC_OR_DEPARTMENT_OTHER): Payer: Medicaid Other

## 2014-02-03 DIAGNOSIS — R0602 Shortness of breath: Secondary | ICD-10-CM

## 2014-02-03 DIAGNOSIS — J209 Acute bronchitis, unspecified: Secondary | ICD-10-CM

## 2014-02-03 DIAGNOSIS — R059 Cough, unspecified: Secondary | ICD-10-CM

## 2014-02-03 DIAGNOSIS — R05 Cough: Secondary | ICD-10-CM

## 2014-02-03 LAB — RAPID STREP SCREEN (MED CTR MEBANE ONLY): Streptococcus, Group A Screen (Direct): NEGATIVE

## 2014-02-03 MED ORDER — IPRATROPIUM-ALBUTEROL 0.5-2.5 (3) MG/3ML IN SOLN
3.0000 mL | RESPIRATORY_TRACT | Status: DC
  Administered 2014-02-03: 3 mL via RESPIRATORY_TRACT
  Filled 2014-02-03: qty 3

## 2014-02-03 MED ORDER — OXYCODONE-ACETAMINOPHEN 5-325 MG PO TABS
1.0000 | ORAL_TABLET | Freq: Once | ORAL | Status: AC
  Administered 2014-02-03: 1 via ORAL
  Filled 2014-02-03: qty 1

## 2014-02-03 MED ORDER — IBUPROFEN 400 MG PO TABS
400.0000 mg | ORAL_TABLET | Freq: Once | ORAL | Status: AC
  Administered 2014-02-03: 400 mg via ORAL
  Filled 2014-02-03: qty 1

## 2014-02-03 MED ORDER — ALBUTEROL SULFATE HFA 108 (90 BASE) MCG/ACT IN AERS: 1.0000 | INHALATION_SPRAY | Freq: Four times a day (QID) | RESPIRATORY_TRACT | Status: DC | PRN

## 2014-02-03 MED ORDER — PREDNISONE 50 MG PO TABS: ORAL_TABLET | ORAL | Status: DC

## 2014-02-03 MED ORDER — PREDNISONE 50 MG PO TABS
60.0000 mg | ORAL_TABLET | Freq: Once | ORAL | Status: AC
  Administered 2014-02-03: 60 mg via ORAL
  Filled 2014-02-03 (×2): qty 1

## 2014-02-03 NOTE — Discharge Instructions (Signed)

## 2014-02-03 NOTE — ED Notes (Signed)
Pt alert, NAD, calm, interactive, resps e/u, speaking in clear complete sentences, no dyspnea noted. Mentions admission for PNA 3 weeks ago, finished abx ~ 3 weeks ago. C/o productive cough ("yellow, noticed some scant blood"), has been taking robitussin (last taken Saturday am). No anti-pyretics or other meds PTA also reports sore throat (sweling noted) and some sob. (Denies: fever, nvd).

## 2014-02-03 NOTE — ED Notes (Signed)
Dr. Christy Gentles into room, at [facility].

## 2014-02-03 NOTE — ED Provider Notes (Signed)
CSN: [id number]     Arrival date & time 02/02/14  2334 History   First MD Initiated Contact with Patient 02/03/14 0123     Chief Complaint  Patient presents with  . Sore Throat    Patient is a 39 y.o. female presenting with pharyngitis. The history is provided by the patient.  Sore Throat This is a new problem. The current episode started more than 2 days ago. The problem occurs daily. The problem has been gradually worsening. Associated symptoms include shortness of breath. The symptoms are aggravated by swallowing. Nothing relieves the symptoms.  patient reports cough for past week and sore throat for past 3 days She reports productive yellow sputum She reports chills No vomiting is reported   She reports symptoms similar to previous episode of pneumonia that required admission at an Outside hospital back in the fall of 2015    Past Medical History  Diagnosis Date  . Hypertension   . Gestational diabetes    Past Surgical History  Procedure Laterality Date  . Ectopic pregnancy surgery     History reviewed. No pertinent family history. History  Substance Use Topics  . Smoking status: Current Every Day Smoker -- 0.50 packs/day    Types: Cigarettes  . Smokeless tobacco: Never Used  . Alcohol Use: No   OB History    Gravida Para Term Preterm AB TAB SAB Ectopic Multiple Living   2              Review of Systems  Constitutional: Positive for chills.  HENT: Positive for sore throat.   Respiratory: Positive for shortness of breath.   Gastrointestinal: Negative for vomiting.  All other systems reviewed and are negative.     Allergies  Review of patient's allergies indicates no known allergies.  Home Medications   Prior to Admission medications   Medication Sig Start Date End Date Taking? Authorizing Provider  acetaminophen (TYLENOL) 325 MG tablet Take 2 tablets (650 mg total) by mouth every 6 (six) hours as needed for pain. 04/11/12   Dorie Rank, MD  doxycycline  (VIBRAMYCIN) 100 MG capsule Take 1 capsule (100 mg total) by mouth 2 (two) times daily. 02/09/13   Tanna Furry, MD  guaiFENesin (ROBITUSSIN) 100 MG/5ML liquid Take 5-10 mLs (100-200 mg total) by mouth every 4 (four) hours as needed for cough. 11/06/13   Ernestina Patches, MD  HYDROcodone-acetaminophen (NORCO/VICODIN) 5-325 MG per tablet Take 1 tablet by mouth every 6 (six) hours as needed for pain. 01/21/12   Blanchie Dessert, MD  HYDROcodone-acetaminophen (NORCO/VICODIN) 5-325 MG per tablet Take 2 tablets by mouth every 4 (four) hours as needed. 02/09/13   Tanna Furry, MD  ondansetron (ZOFRAN ODT) 8 MG disintegrating tablet Take 1 tablet (8 mg total) by mouth every 8 (eight) hours as needed for nausea. 04/11/12   Dorie Rank, MD  ondansetron (ZOFRAN ODT) 8 MG disintegrating tablet 8mg  ODT q4 hours prn nausea 04/30/12   Orpah Greek, MD  predniSONE (DELTASONE) 20 MG tablet 3 tabs po day one, then 2 po daily x 4 days 11/06/13   Ernestina Patches, MD  Prenatal Vit-Fe Fumarate-FA (MULTIVITAMIN-PRENATAL) 27-0.8 MG TABS Take 1 tablet by mouth daily.    Historical Provider, MD   BP 168/101 mmHg  Pulse 72  Temp(Src) 98.4 F (36.9 C) (Oral)  Resp 18  SpO2 100% Physical Exam CONSTITUTIONAL: Well developed/well nourished HEAD: Normocephalic/atraumatic EYES: EOMI/PERRL ENMT: Mucous membranes moist, uvula mildine, erythema and exudates noted NECK: supple no meningeal signs SPINE/BACK:entire  spine nontender CV: S1/S2 noted, no murmurs/rubs/gallops noted LUNGS: decreased breath sounds noted bilaterally, no apparent distress ABDOMEN: soft, nontender, no rebound or guarding, bowel sounds noted throughout abdomen GU:no cva tenderness NEURO: Pt is awake/alert/appropriate, moves all extremitiesx4.  No facial droop.   EXTREMITIES: pulses normal/equal, full ROM SKIN: warm, color normal PSYCH: no abnormalities of mood noted, alert and oriented to situation  ED Course  Procedures  Labs Review Labs Reviewed   RAPID STREP SCREEN  CULTURE, GROUP A STREP    Imaging Review Dg Chest 2 View  02/03/2014   CLINICAL DATA:  Cough and shortness of breath for 1 week. Recent treatment for pneumonia but feels symptoms returning.  EXAM: CHEST  2 VIEW  COMPARISON:  12/05/2013  FINDINGS: Shallow inspiration. Normal heart size and pulmonary vascularity. Peribronchial thickening in the central airways consistent with bronchitis or reactive airways disease. No focal consolidation in the lungs. No blunting of costophrenic angles. No pneumothorax.  IMPRESSION: Peribronchial thickening suggesting bronchitis or reactive airways disease.   Electronically Signed   By: Lucienne Capers M.D.   On: 02/03/2014 01:59    Medications  ipratropium-albuterol (DUONEB) 0.5-2.5 (3) MG/3ML nebulizer solution 3 mL (3 mLs Nebulization Given 02/03/14 0140)  oxyCODONE-acetaminophen (PERCOCET/ROXICET) 5-325 MG per tablet 1 tablet (1 tablet Oral Given 02/03/14 0126)  ibuprofen (ADVIL,MOTRIN) tablet 400 mg (400 mg Oral Given 02/03/14 0126)  predniSONE (DELTASONE) tablet 60 mg (60 mg Oral Given 02/03/14 0223)   Pt well appearing, resting comfortably on reassessment, appropriate for d/c home I don't feel antibiotics warranted Advised to stop smoking   MDM   Final diagnoses:  SOB (shortness of breath)  Cough  Acute bronchitis, unspecified organism    Nursing notes including past medical history and social history reviewed and considered in documentation xrays/imaging reviewed by myself and considered during evaluation Labs/vital reviewed myself and considered during evaluation     Sharyon Cable, MD 02/03/14 0500

## 2014-02-05 LAB — CULTURE, GROUP A STREP

## 2014-04-26 ENCOUNTER — Emergency Department (HOSPITAL_BASED_OUTPATIENT_CLINIC_OR_DEPARTMENT_OTHER)
Admission: EM | Admit: 2014-04-26 | Discharge: 2014-04-26 | Disposition: A | Discharge status: Home / Self Care | Payer: Medicaid Other | Attending: Emergency Medicine | Admitting: Emergency Medicine

## 2014-04-26 ENCOUNTER — Encounter (HOSPITAL_BASED_OUTPATIENT_CLINIC_OR_DEPARTMENT_OTHER): Payer: Self-pay | Admitting: *Deleted

## 2014-04-26 ENCOUNTER — Emergency Department (HOSPITAL_BASED_OUTPATIENT_CLINIC_OR_DEPARTMENT_OTHER): Payer: Medicaid Other

## 2014-04-26 DIAGNOSIS — Y9389 Activity, other specified: Secondary | ICD-10-CM | POA: Diagnosis not present

## 2014-04-26 DIAGNOSIS — IMO0001 Reserved for inherently not codable concepts without codable children: Secondary | ICD-10-CM

## 2014-04-26 DIAGNOSIS — I10 Essential (primary) hypertension: Secondary | ICD-10-CM | POA: Insufficient documentation

## 2014-04-26 DIAGNOSIS — S99912A Unspecified injury of left ankle, initial encounter: Secondary | ICD-10-CM | POA: Insufficient documentation

## 2014-04-26 DIAGNOSIS — S99911A Unspecified injury of right ankle, initial encounter: Secondary | ICD-10-CM | POA: Diagnosis not present

## 2014-04-26 DIAGNOSIS — Y92410 Unspecified street and highway as the place of occurrence of the external cause: Secondary | ICD-10-CM | POA: Diagnosis not present

## 2014-04-26 DIAGNOSIS — Z720 Tobacco use: Secondary | ICD-10-CM | POA: Diagnosis not present

## 2014-04-26 DIAGNOSIS — S8991XA Unspecified injury of right lower leg, initial encounter: Secondary | ICD-10-CM | POA: Insufficient documentation

## 2014-04-26 DIAGNOSIS — S8992XA Unspecified injury of left lower leg, initial encounter: Secondary | ICD-10-CM | POA: Insufficient documentation

## 2014-04-26 DIAGNOSIS — Y998 Other external cause status: Secondary | ICD-10-CM | POA: Diagnosis not present

## 2014-04-26 DIAGNOSIS — Z79899 Other long term (current) drug therapy: Secondary | ICD-10-CM | POA: Insufficient documentation

## 2014-04-26 DIAGNOSIS — Z8632 Personal history of gestational diabetes: Secondary | ICD-10-CM | POA: Diagnosis not present

## 2014-04-26 DIAGNOSIS — R030 Elevated blood-pressure reading, without diagnosis of hypertension: Secondary | ICD-10-CM

## 2014-04-26 DIAGNOSIS — M79604 Pain in right leg: Secondary | ICD-10-CM

## 2014-04-26 DIAGNOSIS — M79605 Pain in left leg: Secondary | ICD-10-CM

## 2014-04-26 MED ORDER — METOPROLOL TARTRATE 50 MG PO TABS: 25.0000 mg | ORAL_TABLET | Freq: Two times a day (BID) | ORAL | Status: DC

## 2014-04-26 MED ORDER — METOPROLOL TARTRATE 50 MG PO TABS
25.0000 mg | ORAL_TABLET | Freq: Once | ORAL | Status: AC
  Administered 2014-04-26: 25 mg via ORAL
  Filled 2014-04-26: qty 1

## 2014-04-26 MED ORDER — HYDROCODONE-ACETAMINOPHEN 5-325 MG PO TABS
1.0000 | ORAL_TABLET | Freq: Once | ORAL | Status: AC
  Administered 2014-04-26: 1 via ORAL
  Filled 2014-04-26: qty 1

## 2014-04-26 MED ORDER — HYDROCODONE-ACETAMINOPHEN 5-325 MG PO TABS: ORAL_TABLET | ORAL | Status: DC

## 2014-04-26 MED ORDER — METOPROLOL TARTRATE 25 MG PO TABS: 25.0000 mg | ORAL_TABLET | Freq: Two times a day (BID) | ORAL | Status: DC
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
## 2014-04-26 NOTE — Discharge Instructions (Signed)
Rest, Ice intermittently (in the first 24-48 hours), Gentle compression with an Ace wrap, and elevate (Limb above the level of the heart) °  °Take up to 800mg of ibuprofen (that is usually 4 over the counter pills)  3 times a day for 5 days. Take with food. ° °Take vicodin for breakthrough pain, do not drink alcohol, drive, care for children or do other critical tasks while taking vicodin. ° °Please follow with your primary care doctor in the next 2 days for a check-up. They must obtain records for further management.  ° °Do not hesitate to return to the Emergency Department for any new, worsening or concerning symptoms.  ° ° °

## 2014-11-29 ENCOUNTER — Emergency Department (HOSPITAL_BASED_OUTPATIENT_CLINIC_OR_DEPARTMENT_OTHER)
Admission: EM | Admit: 2014-11-29 | Discharge: 2014-11-29 | Disposition: A | Discharge status: Home / Self Care | Payer: Medicaid Other | Attending: Emergency Medicine | Admitting: Emergency Medicine

## 2014-11-29 ENCOUNTER — Encounter (HOSPITAL_BASED_OUTPATIENT_CLINIC_OR_DEPARTMENT_OTHER): Payer: Self-pay | Admitting: *Deleted

## 2014-11-29 DIAGNOSIS — I10 Essential (primary) hypertension: Secondary | ICD-10-CM | POA: Diagnosis not present

## 2014-11-29 DIAGNOSIS — Z79899 Other long term (current) drug therapy: Secondary | ICD-10-CM | POA: Diagnosis not present

## 2014-11-29 DIAGNOSIS — Z720 Tobacco use: Secondary | ICD-10-CM | POA: Insufficient documentation

## 2014-11-29 DIAGNOSIS — N611 Abscess of the breast and nipple: Secondary | ICD-10-CM | POA: Diagnosis not present

## 2014-11-29 DIAGNOSIS — N644 Mastodynia: Secondary | ICD-10-CM | POA: Diagnosis present

## 2014-11-29 DIAGNOSIS — Z8632 Personal history of gestational diabetes: Secondary | ICD-10-CM | POA: Insufficient documentation

## 2014-11-29 MED ORDER — DOXYCYCLINE HYCLATE 100 MG PO CAPS: 100.0000 mg | ORAL_CAPSULE | Freq: Two times a day (BID) | ORAL | Status: DC

## 2014-11-29 NOTE — ED Notes (Signed)
Presents with area at rt breast near nipple, states has had pus, scabs at site, bleeding also noted intermittently, has been using peroxide

## 2014-11-29 NOTE — Discharge Instructions (Signed)
Restart sure blood pressure medicines. Follow-up to have your blood pressure recheck. Make an appointment to follow-up with general surgery for reevaluation of the breast skin cyst or abscess. Doxycycline reordered. Return for any new or worse symptoms.

## 2014-11-29 NOTE — ED Notes (Signed)
MD at bedside. 

## 2014-11-29 NOTE — ED Notes (Signed)
DC instructions with pt included, importance of keeping area clean and dry, importance of handwashing, discussed abx prescribed by EDP and importance of completing medication as ordered, also discussed making an appoint with referral surgery group. Opportunity for questions provided

## 2014-11-29 NOTE — ED Notes (Signed)
States has abscess on rt breast that will not heal, has seem MD in [place], but area cont to return and have drainage and pain to site

## 2014-11-29 NOTE — ED Provider Notes (Signed)
CSN: [id number]     Arrival date & time 11/29/14  0818 History   First MD Initiated Contact with Patient 11/29/14 [id number]     Chief Complaint  Patient presents with  . Breast Pain     (Consider location/radiation/quality/duration/timing/severity/associated sxs/prior Treatment) The history is provided by the patient.   39 year old female with the approximately two-month history of a abscess to the right breast area. That has drained purulent discharge. Patient's been on 2 different antibiotics. Patient saw a general surgeon for this, not recently, in [place] felt it was a skin cyst. However it still draining some purulent discharge and is a hard area under the breasts of patient is concerned about possible tumor. Patient's been on Septra and then a course of doxycycline. Patient has a history of hypertension she stopped her hypertensive meds. She felt they were interfering with the antibiotic.  Past Medical History  Diagnosis Date  . Hypertension   . Gestational diabetes    Past Surgical History  Procedure Laterality Date  . Ectopic pregnancy surgery     No family history on file. Social History  Substance Use Topics  . Smoking status: Current Every Day Smoker -- 0.50 packs/day    Types: Cigarettes  . Smokeless tobacco: Never Used  . Alcohol Use: No   OB History    Gravida Para Term Preterm AB TAB SAB Ectopic Multiple Living   2              Review of Systems  Constitutional: Negative for fever.  HENT: Negative for congestion.   Eyes: Negative for redness.  Respiratory: Negative for shortness of breath.   Cardiovascular: Positive for chest pain.  Gastrointestinal: Negative for abdominal pain.  Genitourinary: Negative for dysuria.  Musculoskeletal: Negative for back pain.  Skin: Positive for wound.  Neurological: Negative for headaches.  Hematological: Does not bruise/bleed easily.  Psychiatric/Behavioral: Negative for confusion.      Allergies  Review of  patient's allergies indicates no known allergies.  Home Medications   Prior to Admission medications   Medication Sig Start Date End Date Taking? Authorizing Provider  metoprolol (LOPRESSOR) 25 MG tablet Take 1 tablet (25 mg total) by mouth 2 (two) times daily. 04/26/14  Yes Nicole Pisciotta, PA-C  doxycycline (VIBRAMYCIN) 100 MG capsule Take 1 capsule (100 mg total) by mouth 2 (two) times daily. 11/29/14   Fredia Sorrow, MD  HYDROcodone-acetaminophen (NORCO/VICODIN) 5-325 MG per tablet Take 1-2 tablets by mouth every 6 hours as needed for pain and/or cough. 04/26/14   Nicole Pisciotta, PA-C  Prenatal Vit-Fe Fumarate-FA (MULTIVITAMIN-PRENATAL) 27-0.8 MG TABS Take 1 tablet by mouth daily.    Historical Provider, MD   BP 184/123 mmHg  Pulse 68  Temp(Src) 98.7 F (37.1 C) (Oral)  Resp 18  Ht 5\' 5"  (1.651 m)  Wt 210 lb (95.255 kg)  BMI 34.95 kg/m2  SpO2 99% Physical Exam  Constitutional: She is oriented to person, place, and time. She appears well-developed and well-nourished. No distress.  HENT:  Head: Normocephalic and atraumatic.  Mouth/Throat: Oropharynx is clear and moist.  Eyes: Conjunctivae and EOM are normal. Pupils are equal, round, and reactive to light.  Neck: Normal range of motion. Neck supple.  Cardiovascular: Normal rate, regular rhythm and normal heart sounds.   No murmur heard. Pulmonary/Chest: Effort normal and breath sounds normal. No respiratory distress.  Abdominal: Soft. Bowel sounds are normal. She exhibits no distension.  Musculoskeletal: Normal range of motion.  Neurological: She is alert and oriented to person,  place, and time. She displays normal reflexes. No cranial nerve deficit. She exhibits normal muscle tone. Coordination normal.  Skin: Skin is warm. There is erythema.  Area of the right breast medial aspect with a 5 mm skin lesion non-scabbed. Pink type granulation tissue. Slight area of induration just lateral to that towards the nipple. When not  pressed on does express some pus. No significant fluctuance no large area of induration. Firm breast tissue underneath. Could be due to chronic infection in the area. No adenopathy.  Nursing note and vitals reviewed.   ED Course  Procedures (including critical care time) Labs Review Labs Reviewed - No data to display  Imaging Review No results found. I have personally reviewed and evaluated these images and lab results as part of my medical decision-making.   EKG Interpretation None      MDM   Final diagnoses:  Breast abscess  Essential hypertension    Patient with pre-existing hypertension stopped her medications. She has them to restart. Patient followed by primary care in [place]. Patient's now had several weeks to a couple months worth of draining cyst around the right breast. Has been on a course of Septra course of doxycycline. Currently on no antibiotics. Patient will referred to general surgery to make sure is not underlying breast tumor.  Breast abscess or just skin cyst is still draining some does not require I&D today. Clinically seems to be suggestive of  residual skin cyst.    Fredia Sorrow, MD 11/29/14 [phone]

## 2016-07-21 ENCOUNTER — Encounter (HOSPITAL_BASED_OUTPATIENT_CLINIC_OR_DEPARTMENT_OTHER): Payer: Self-pay | Admitting: Emergency Medicine

## 2016-07-21 ENCOUNTER — Emergency Department (HOSPITAL_BASED_OUTPATIENT_CLINIC_OR_DEPARTMENT_OTHER)
Admission: EM | Admit: 2016-07-21 | Discharge: 2016-07-21 | Disposition: A | Discharge status: Home / Self Care | Payer: Medicaid Other | Attending: Emergency Medicine | Admitting: Emergency Medicine

## 2016-07-21 ENCOUNTER — Emergency Department (HOSPITAL_BASED_OUTPATIENT_CLINIC_OR_DEPARTMENT_OTHER): Payer: Medicaid Other

## 2016-07-21 DIAGNOSIS — I10 Essential (primary) hypertension: Secondary | ICD-10-CM | POA: Insufficient documentation

## 2016-07-21 DIAGNOSIS — R51 Headache: Secondary | ICD-10-CM | POA: Insufficient documentation

## 2016-07-21 DIAGNOSIS — R202 Paresthesia of skin: Secondary | ICD-10-CM

## 2016-07-21 DIAGNOSIS — R200 Anesthesia of skin: Secondary | ICD-10-CM | POA: Diagnosis not present

## 2016-07-21 DIAGNOSIS — F17210 Nicotine dependence, cigarettes, uncomplicated: Secondary | ICD-10-CM | POA: Diagnosis not present

## 2016-07-21 DIAGNOSIS — Z7982 Long term (current) use of aspirin: Secondary | ICD-10-CM | POA: Insufficient documentation

## 2016-07-21 DIAGNOSIS — R519 Headache, unspecified: Secondary | ICD-10-CM

## 2016-07-21 LAB — BASIC METABOLIC PANEL
ANION GAP: 7 (ref 5–15)
BUN: 11 mg/dL (ref 6–20)
CO2: 26 mmol/L (ref 22–32)
Calcium: 9.2 mg/dL (ref 8.9–10.3)
Chloride: 102 mmol/L (ref 101–111)
Creatinine, Ser: 1.43 mg/dL — ABNORMAL HIGH (ref 0.44–1.00)
GFR calc Af Amer: 52 mL/min — ABNORMAL LOW (ref >60)
GFR calc non Af Amer: 45 mL/min — ABNORMAL LOW (ref >60)
Glucose, Bld: 135 mg/dL — ABNORMAL HIGH (ref 65–99)
Potassium: 3.6 mmol/L (ref 3.5–5.1)
Sodium: 135 mmol/L (ref 135–145)

## 2016-07-21 LAB — CBC WITH DIFFERENTIAL/PLATELET
Basophils Absolute: 0 10*3/uL (ref 0.0–0.1)
Basophils Relative: 0 %
EOS PCT: 1 %
Eosinophils Absolute: 0.1 10*3/uL (ref 0.0–0.7)
HEMATOCRIT: 41 % (ref 36.0–46.0)
Hemoglobin: 13.9 g/dL (ref 12.0–15.0)
Lymphocytes Relative: 28 %
Lymphs Abs: 1.7 10*3/uL (ref 0.7–4.0)
MCH: 26.9 pg (ref 26.0–34.0)
MCHC: 33.9 g/dL (ref 30.0–36.0)
MCV: 79.3 fL (ref 78.0–100.0)
Monocytes Absolute: 0.4 10*3/uL (ref 0.1–1.0)
Monocytes Relative: 7 %
NEUTROS ABS: 3.9 10*3/uL (ref 1.7–7.7)
Neutrophils Relative %: 64 %
Platelets: 229 10*3/uL (ref 150–400)
RBC: 5.17 MIL/uL — ABNORMAL HIGH (ref 3.87–5.11)
RDW: 14.5 % (ref 11.5–15.5)
WBC: 6.1 10*3/uL (ref 4.0–10.5)

## 2016-07-21 MED ORDER — KETOROLAC TROMETHAMINE 30 MG/ML IJ SOLN
30.0000 mg | Freq: Once | INTRAMUSCULAR | Status: AC
  Administered 2016-07-21: 30 mg via INTRAVENOUS
  Filled 2016-07-21: qty 1

## 2016-07-21 NOTE — ED Provider Notes (Signed)
[facility] Provider Note   CSN: [id number] Arrival date & time: 07/21/16  1020     History   Chief Complaint Chief Complaint  Patient presents with  . Headache    HPI Jodi Porter is a 41 y.o. female.  Patient is a 41 year old female with past medical history of hypertension and gestational diabetes. She presents for evaluation of headache and left arm and leg numbness. This has apparently been ongoing intermittently for the past several days, but became worse this morning. She was recently restarted on her blood pressure medications after being off of them for a prolonged period of time secondary to financial/insurance reasons. She denies any head trauma or injury. She denies any visual disturbances.   The history is provided by the patient.  Headache   This is a new problem. The current episode started yesterday. The problem occurs constantly. The problem has been gradually worsening. The headache is associated with bright light and activity. The pain is mild. The pain does not radiate. Pertinent negatives include no fever, no nausea and no vomiting. She has tried nothing for the symptoms.    Past Medical History:  Diagnosis Date  . Gestational diabetes   . Hypertension     There are no active problems to display for this patient.   Past Surgical History:  Procedure Laterality Date  . ECTOPIC PREGNANCY SURGERY      OB History    Gravida Para Term Preterm AB Living   2             SAB TAB Ectopic Multiple Live Births                   Home Medications    Prior to Admission medications   Medication Sig Start Date End Date Taking? Authorizing Provider  amLODipine (NORVASC) 10 MG tablet Take 10 mg by mouth daily.   Yes [provider]  aspirin 325 MG tablet Take 325 mg by mouth daily.   Yes [provider]  lisinopril (PRINIVIL,ZESTRIL) 40 MG tablet Take 40 mg by mouth daily.   Yes [provider]  losartan (COZAAR)  100 MG tablet Take 100 mg by mouth daily.   Yes [provider]  triamterene-hydrochlorothiazide (DYAZIDE) 37.5-25 MG capsule Take 1 capsule by mouth daily.   Yes [provider]  doxycycline (VIBRAMYCIN) 100 MG capsule Take 1 capsule (100 mg total) by mouth 2 (two) times daily. 11/29/14   Fredia Sorrow, MD  HYDROcodone-acetaminophen (NORCO/VICODIN) 5-325 MG per tablet Take 1-2 tablets by mouth every 6 hours as needed for pain and/or cough. 04/26/14   Pisciotta, Elmyra Ricks, PA-C  metoprolol (LOPRESSOR) 25 MG tablet Take 1 tablet (25 mg total) by mouth 2 (two) times daily. 04/26/14   Pisciotta, Elmyra Ricks, PA-C  Prenatal Vit-Fe Fumarate-FA (MULTIVITAMIN-PRENATAL) 27-0.8 MG TABS Take 1 tablet by mouth daily.    [provider]    Family History History reviewed. No pertinent family history.  Social History Social History  Substance Use Topics  . Smoking status: Current Every Day Smoker    Packs/day: 0.50    Types: Cigarettes  . Smokeless tobacco: Never Used  . Alcohol use No     Allergies   Patient has no known allergies.   Review of Systems Review of Systems  Constitutional: Negative for fever.  Gastrointestinal: Negative for nausea and vomiting.  Neurological: Positive for headaches.  All other systems reviewed and are negative.    Physical Exam Updated Vital Signs  BP 121/68 (BP Location: Right Arm)   Pulse 87   Temp 98.2 F (36.8 C) (Oral)   Resp 16   Ht 5\' 5"  (1.651 m)   Wt 95.3 kg (210 lb)   SpO2 100%   BMI 34.95 kg/m   Physical Exam   ED Treatments / Results  Labs (all labs ordered are listed, but only abnormal results are displayed) Labs Reviewed  BASIC METABOLIC PANEL  CBC WITH DIFFERENTIAL/PLATELET    EKG  EKG Interpretation None       Radiology No results found.  Procedures Procedures (including critical care time)  Medications Ordered in ED Medications  ketorolac (TORADOL) 30 MG/ML injection 30 mg (not  administered)     Initial Impression / Assessment and Plan / ED Course  I have reviewed the triage vital signs and the nursing notes.  Pertinent labs & imaging results that were available during my care of the patient were reviewed by me and considered in my medical decision making (see chart for details).  Patient presents here with complaints of headache and numbness that started earlier this week. Her neurologic exam is nonfocal and workup is unremarkable. I see no abnormality on the CT scan and she is now feeling better after IV fluids and medications. She will be discharged, to follow-up with her primary doctor.  Final Clinical Impressions(s) / ED Diagnoses   Final diagnoses:  None    New Prescriptions New Prescriptions   No medications on file     Veryl Speak, MD 07/21/16 1223

## 2016-07-21 NOTE — ED Triage Notes (Signed)
Patient states that she has had HTN since she was pregnant. The patient has had HTN since and lost her insurance so she was unable to get her meds. About a month ago she started to have Headaches and right arm numbness intermittently. They have been adjusting her medications. The patient reports that the worst of the episode happened this am and the numbness lasted even longer.  Patient has equal grips - no noted distress

## 2016-07-21 NOTE — Discharge Instructions (Signed)
Continue your medications as before.  Follow-up with your primary Dr. in the next week if symptoms have not completely resolved.

## 2017-09-29 IMAGING — CT CT HEAD W/O CM
3 series · 15 of 47 positions shown, 18 images · non-contrast
Comparison: Head and neck CT angiogram 06/09/2016.

CLINICAL DATA: Headache for 1 month. Right side numbness last night
and this morning. No known injury.

EXAM:
CT HEAD WITHOUT CONTRAST
TECHNIQUE: Contiguous axial images were obtained from the base of the skull
through the vertex without intravenous contrast.

[Series 2: head wo · axial · 0.43mm/px · z∈[-179,-54]mm · 9 of 31 slices shown, 12 images]
[im 3/31  brain]
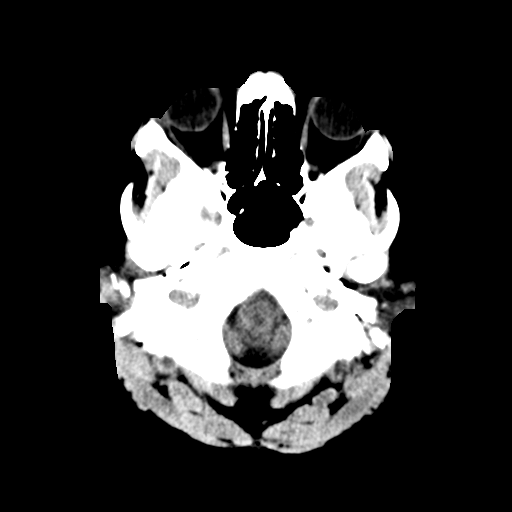
[im 3/31  bone]
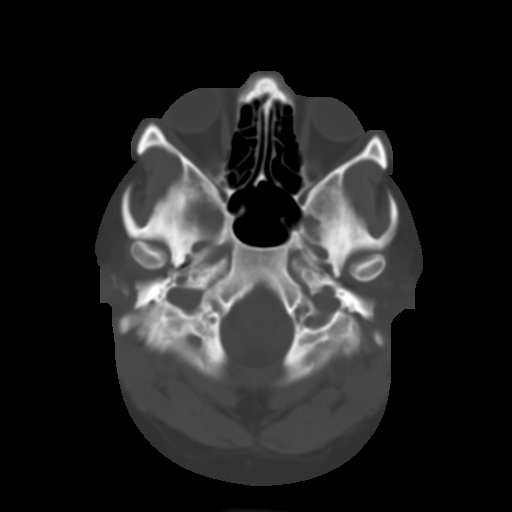
[im 6/31  brain]
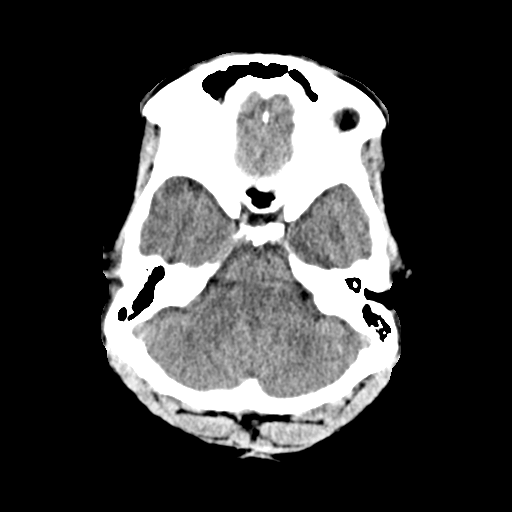
[im 9/31  brain]
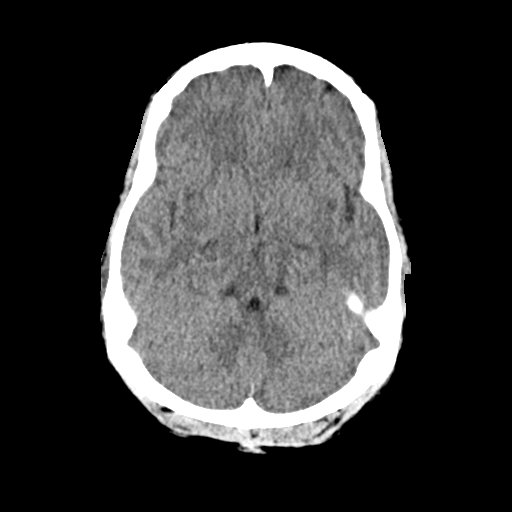
[im 12/31  brain]
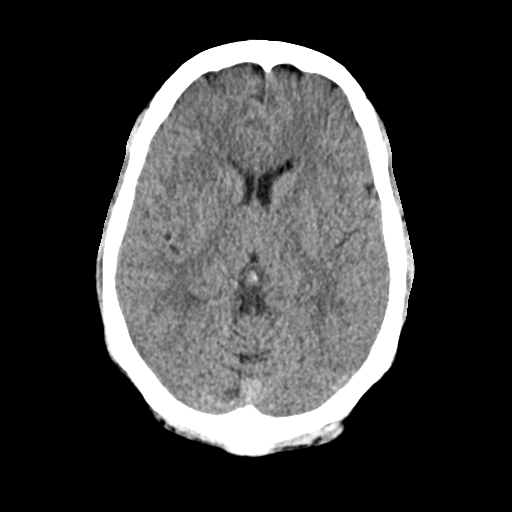
[im 16/31  brain]
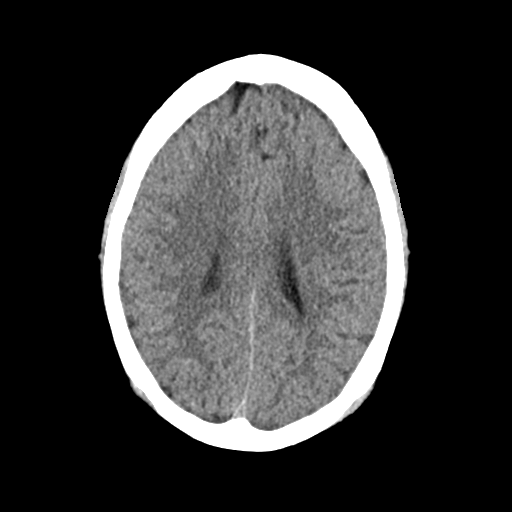
[im 16/31  bone]
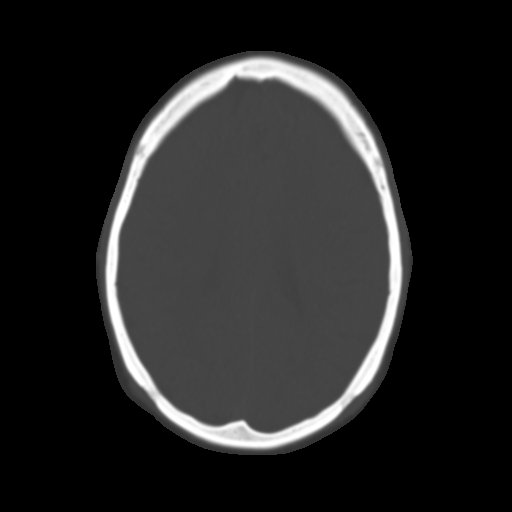
[im 19/31  brain]
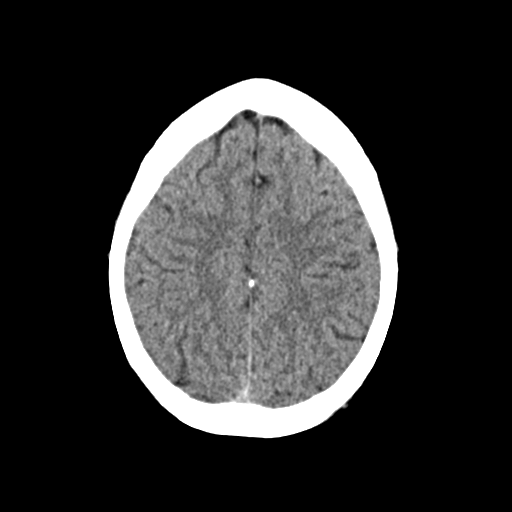
[im 22/31  brain]
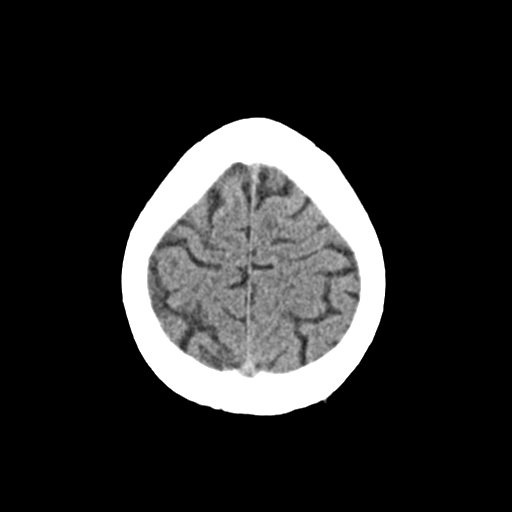
[im 25/31  brain]
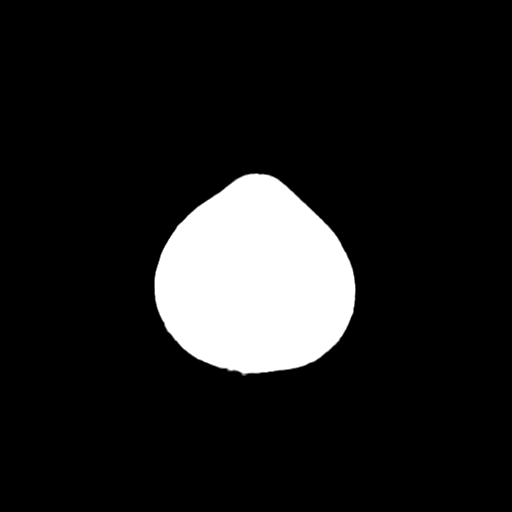
[im 28/31  brain]
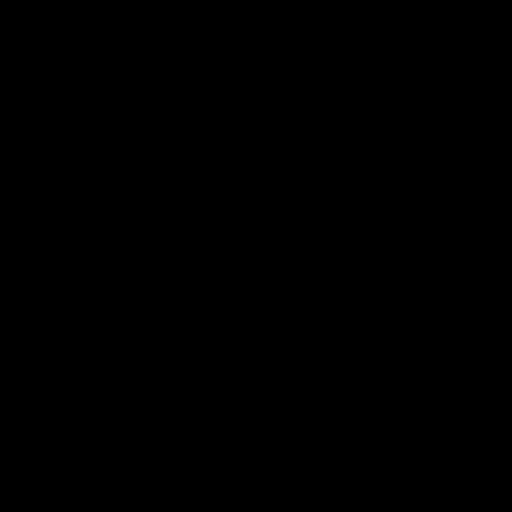
[im 28/31  bone]
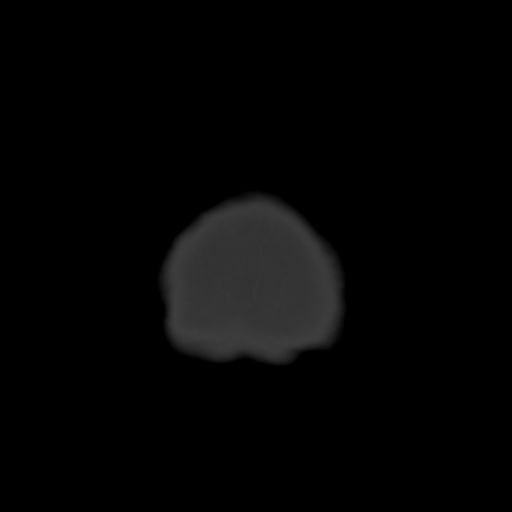

[Series 4: coronal soft · coronal · 0.30mm/px · 3 of 63 slices shown]
[im 21/63  brain]
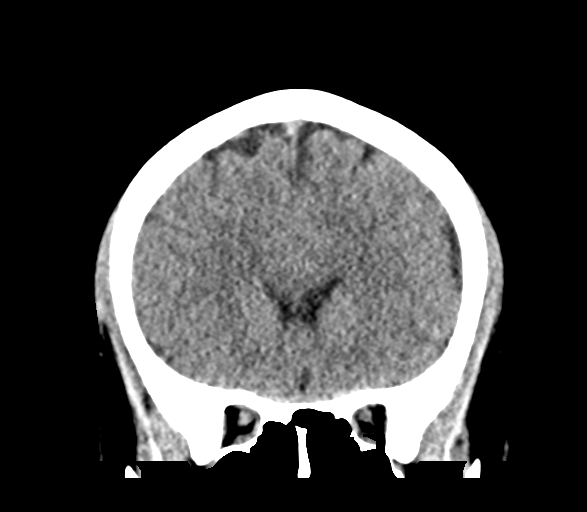
[im 28/63  brain]
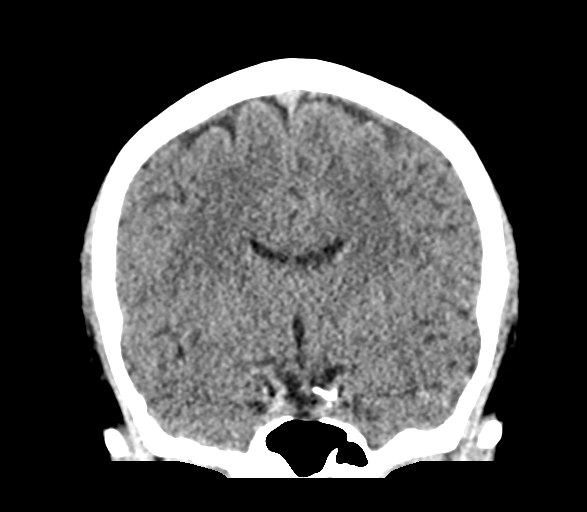
[im 35/63  brain]
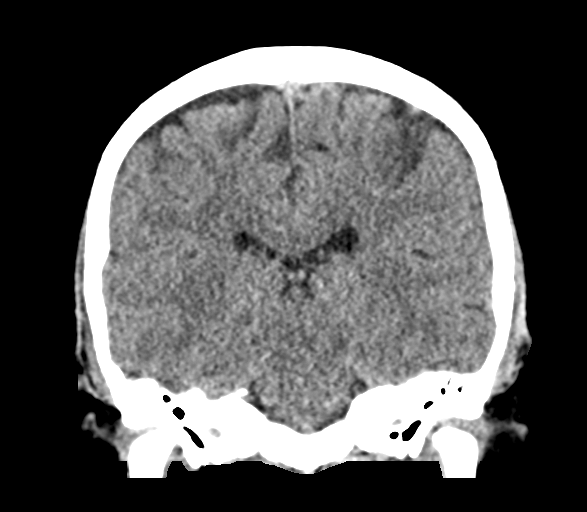

[Series 5: sag soft · sagittal · 0.29mm/px · 3 of 54 slices shown]
[im 18/54  brain]
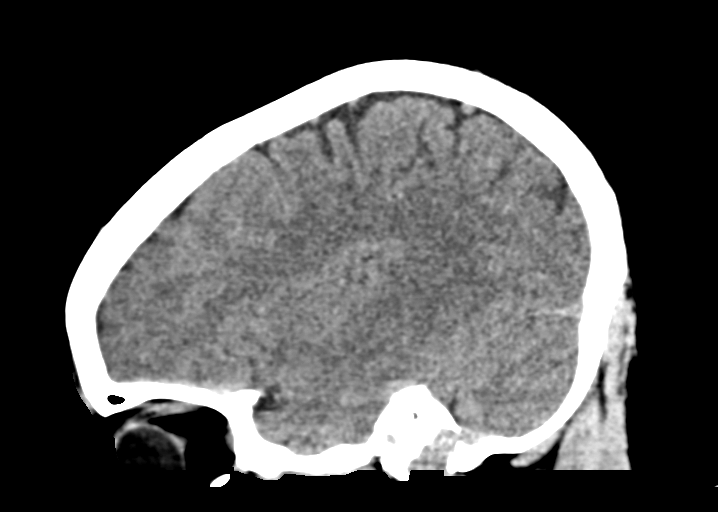
[im 27/54  brain]
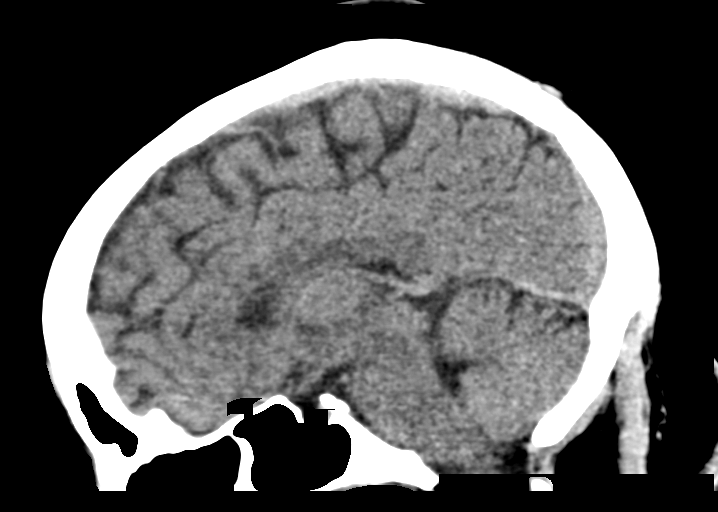
[im 36/54  brain]
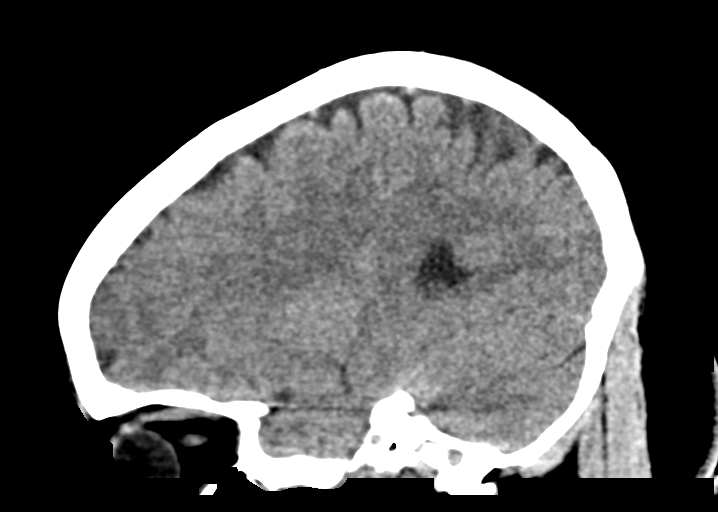

[15 of 47 positions shown; findings below may reference images not displayed]

FINDINGS: Brain: Appears normal without hemorrhage, infarct, mass lesion, mass
effect, midline shift or abnormal extra-axial fluid collection. No
hydrocephalus or pneumocephalus.

Vascular: Negative.

Skull: Intact.

Sinuses/Orbits: Negative.

Other: None.
IMPRESSION: Normal head CT.

## 2018-01-29 ENCOUNTER — Encounter (HOSPITAL_BASED_OUTPATIENT_CLINIC_OR_DEPARTMENT_OTHER): Payer: Self-pay | Admitting: Emergency Medicine

## 2018-01-29 ENCOUNTER — Other Ambulatory Visit: Payer: Self-pay

## 2018-01-29 ENCOUNTER — Emergency Department (HOSPITAL_BASED_OUTPATIENT_CLINIC_OR_DEPARTMENT_OTHER)
Admission: EM | Admit: 2018-01-29 | Discharge: 2018-01-29 | Disposition: A | Discharge status: Home / Self Care | Payer: Self-pay | Attending: Emergency Medicine | Admitting: Emergency Medicine

## 2018-01-29 DIAGNOSIS — Z79899 Other long term (current) drug therapy: Secondary | ICD-10-CM | POA: Insufficient documentation

## 2018-01-29 DIAGNOSIS — M542 Cervicalgia: Secondary | ICD-10-CM | POA: Insufficient documentation

## 2018-01-29 DIAGNOSIS — F17210 Nicotine dependence, cigarettes, uncomplicated: Secondary | ICD-10-CM | POA: Insufficient documentation

## 2018-01-29 DIAGNOSIS — I10 Essential (primary) hypertension: Secondary | ICD-10-CM | POA: Insufficient documentation

## 2018-01-29 DIAGNOSIS — Z7982 Long term (current) use of aspirin: Secondary | ICD-10-CM | POA: Insufficient documentation

## 2018-01-29 MED ORDER — METHOCARBAMOL 750 MG PO TABS: 750.0000 mg | ORAL_TABLET | Freq: Three times a day (TID) | ORAL | 0 refills | Status: DC | PRN
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
## 2018-01-29 NOTE — Discharge Instructions (Addendum)
Today we discussed possible evaluation options for your pain including imaging such as CT scan and x-rays.  We discussed that muscle spasms would not show up on these.  We discussed the risks and benefits of imaging and not performing them.   You are being prescribed a medication which may make you sleepy. For 24 hours after one dose please do not drive, operate heavy machinery, care for a small child with out another adult present, or perform any activities that may cause harm to you or someone else if you were to fall asleep or be impaired.   The best way to get rid of muscle pain is by taking NSAIDS, using heat, massage therapy, and gentle stretching/range of motion exercises.  Please take Tylenol (acetaminophen) to relieve your pain in addition to your Aleve (a NSAID) .  You may take tylenol, up to 1,000 mg (two extra strength pills).  Do not take more than 3,000 mg tylenol in a 24 hour period.  Please check all medication labels as many medications such as pain and cold medications may contain tylenol. Please do not drink alcohol while taking this medication.

## 2018-01-29 NOTE — ED Notes (Signed)
ED Provider at bedside. 
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
## 2018-06-13 ENCOUNTER — Other Ambulatory Visit: Payer: Self-pay

## 2018-06-13 ENCOUNTER — Emergency Department (HOSPITAL_BASED_OUTPATIENT_CLINIC_OR_DEPARTMENT_OTHER): Payer: Self-pay

## 2018-06-13 ENCOUNTER — Observation Stay (HOSPITAL_BASED_OUTPATIENT_CLINIC_OR_DEPARTMENT_OTHER)
Admission: EM | Admit: 2018-06-13 | Discharge: 2018-06-13 | Disposition: A | Discharge status: Home / Self Care | Payer: Self-pay | Attending: Internal Medicine | Admitting: Internal Medicine

## 2018-06-13 ENCOUNTER — Encounter (HOSPITAL_BASED_OUTPATIENT_CLINIC_OR_DEPARTMENT_OTHER): Payer: Self-pay | Admitting: Emergency Medicine

## 2018-06-13 DIAGNOSIS — Z1159 Encounter for screening for other viral diseases: Secondary | ICD-10-CM | POA: Insufficient documentation

## 2018-06-13 DIAGNOSIS — E785 Hyperlipidemia, unspecified: Secondary | ICD-10-CM | POA: Diagnosis present

## 2018-06-13 DIAGNOSIS — Z7982 Long term (current) use of aspirin: Secondary | ICD-10-CM | POA: Insufficient documentation

## 2018-06-13 DIAGNOSIS — E669 Obesity, unspecified: Secondary | ICD-10-CM | POA: Insufficient documentation

## 2018-06-13 DIAGNOSIS — Z79899 Other long term (current) drug therapy: Secondary | ICD-10-CM | POA: Insufficient documentation

## 2018-06-13 DIAGNOSIS — F17210 Nicotine dependence, cigarettes, uncomplicated: Secondary | ICD-10-CM | POA: Insufficient documentation

## 2018-06-13 DIAGNOSIS — I10 Essential (primary) hypertension: Secondary | ICD-10-CM | POA: Insufficient documentation

## 2018-06-13 DIAGNOSIS — R0789 Other chest pain: Principal | ICD-10-CM | POA: Insufficient documentation

## 2018-06-13 DIAGNOSIS — R079 Chest pain, unspecified: Secondary | ICD-10-CM | POA: Diagnosis present

## 2018-06-13 LAB — CBC
HCT: 42.2 % (ref 36.0–46.0)
Hemoglobin: 13.5 g/dL (ref 12.0–15.0)
MCH: 26.8 pg (ref 26.0–34.0)
MCHC: 32 g/dL (ref 30.0–36.0)
MCV: 83.7 fL (ref 80.0–100.0)
Platelets: 210 10*3/uL (ref 150–400)
RBC: 5.04 MIL/uL (ref 3.87–5.11)
RDW: 14.6 % (ref 11.5–15.5)
WBC: 7 10*3/uL (ref 4.0–10.5)
nRBC: 0 % (ref 0.0–0.2)

## 2018-06-13 LAB — URINALYSIS, ROUTINE W REFLEX MICROSCOPIC
Bilirubin Urine: NEGATIVE
Glucose, UA: NEGATIVE mg/dL
Ketones, ur: NEGATIVE mg/dL
Leukocytes,Ua: NEGATIVE
Nitrite: NEGATIVE
Protein, ur: NEGATIVE mg/dL
Specific Gravity, Urine: 1.025 (ref 1.005–1.030)
pH: 6.5 (ref 5.0–8.0)

## 2018-06-13 LAB — COMPREHENSIVE METABOLIC PANEL
ALT: 14 U/L (ref 0–44)
AST: 16 U/L (ref 15–41)
Albumin: 3.6 g/dL (ref 3.5–5.0)
Alkaline Phosphatase: 59 U/L (ref 38–126)
Anion gap: 6 (ref 5–15)
BUN: 13 mg/dL (ref 6–20)
CO2: 26 mmol/L (ref 22–32)
Calcium: 8.9 mg/dL (ref 8.9–10.3)
Chloride: 105 mmol/L (ref 98–111)
Creatinine, Ser: 1.28 mg/dL — ABNORMAL HIGH (ref 0.44–1.00)
GFR calc Af Amer: 60 mL/min — ABNORMAL LOW (ref >60)
GFR calc non Af Amer: 52 mL/min — ABNORMAL LOW (ref >60)
Glucose, Bld: 175 mg/dL — ABNORMAL HIGH (ref 70–99)
Potassium: 3.8 mmol/L (ref 3.5–5.1)
Sodium: 137 mmol/L (ref 135–145)
Total Bilirubin: 0.3 mg/dL (ref 0.3–1.2)
Total Protein: 7.1 g/dL (ref 6.5–8.1)

## 2018-06-13 LAB — LIPASE, BLOOD: Lipase: 29 U/L (ref 11–51)

## 2018-06-13 LAB — TROPONIN I
Troponin I: <0.03 ng/mL (ref <0.03)
Troponin I: <0.03 ng/mL (ref <0.03)
Troponin I: <0.03 ng/mL (ref <0.03)

## 2018-06-13 LAB — URINALYSIS, MICROSCOPIC (REFLEX)

## 2018-06-13 LAB — SARS CORONAVIRUS 2 AG (30 MIN TAT): SARS Coronavirus 2 Ag: NEGATIVE

## 2018-06-13 LAB — PREGNANCY, URINE: Preg Test, Ur: NEGATIVE

## 2018-06-13 MED ORDER — ALUM & MAG HYDROXIDE-SIMETH 200-200-20 MG/5ML PO SUSP
30.0000 mL | Freq: Once | ORAL | Status: AC
  Administered 2018-06-13: 30 mL via ORAL
  Filled 2018-06-13: qty 30

## 2018-06-13 MED ORDER — MORPHINE SULFATE (PF) 2 MG/ML IV SOLN: 2.0000 mg | INTRAVENOUS | Status: DC | PRN

## 2018-06-13 MED ORDER — LIDOCAINE VISCOUS HCL 2 % MT SOLN
15.0000 mL | Freq: Once | OROMUCOSAL | Status: AC
  Administered 2018-06-13: 15 mL via ORAL
  Filled 2018-06-13: qty 15

## 2018-06-13 MED ORDER — ACETAMINOPHEN 325 MG PO TABS: 650.0000 mg | ORAL_TABLET | ORAL | Status: DC | PRN

## 2018-06-13 MED ORDER — ONDANSETRON HCL 4 MG/2ML IJ SOLN: 4.0000 mg | Freq: Four times a day (QID) | INTRAMUSCULAR | Status: DC | PRN

## 2018-06-13 NOTE — ED Triage Notes (Signed)
Chest pain radiating to abdomen and back since yesterday.  Last episode one year ago.  Pt states pain is severe, breaking out in sweats and has to sit down until is subsides.    Pt also has boil under her left breast.

## 2018-06-13 NOTE — Plan of Care (Signed)
  Problem: Activity: Goal: Ability to return to baseline activity level will improve Outcome: Progressing   Problem: Education: Goal: Understanding of CV disease, CV risk reduction, and recovery process will improve Outcome: Progressing Goal: Individualized Educational Video(s) Outcome: Progressing   

## 2018-06-13 NOTE — ED Notes (Signed)
This rn alerted to bedside to answer pt call bell. Pt is crying, states she is "having a crushing feeling" in her chest and feeling sob. Dr. Tyrone Nine alerted to bedside. Pt hr noted to be in mid 30's, sinus brady per monitor. Pt assisted to sit up in bed, hr increases to mid 50's and pt reports some relief of chest "crushing" and sob. Dr. Tyrone Nine talking with pt, no new orders received.

## 2018-06-13 NOTE — ED Notes (Signed)
Report given to Vernie Shanks, RN on 6E at [facility]

## 2018-06-13 NOTE — ED Provider Notes (Signed)
[facility] EMERGENCY DEPARTMENT Provider Note   CSN: [id number] Arrival date & time: 06/13/18  1151    History   Chief Complaint Chief Complaint  Patient presents with  . Chest Pain  . Abdominal Pain    HPI Jodi Porter is a 43 y.o. female.     43 yo F with a chief complaint of palpitations.  This been waking her from sleep feels that her heart is racing and she  is unable to talk she had about 4 episodes like this last night and then 1 2 earlier today.  States her happening about every 2 hours.  She gets very sweaty when this happens and then usually spontaneous resolves and she is able to lay back and go back to sleep.  She is worried that sleep is causing them and so she has been staying awake.  Worse when she lays back flat.  She has had some exertional shortness of breath today that she feels is new.  She denies history of MI or PE or DVT.  She has a history of hypertension hyperlipidemia and smoking.  Her father had an MI in his late 75s.  Denies diabetes.  She denies unilateral lower extremity edema hemoptysis estrogen use prolonged immobilization or travel.  Denies recent surgery or hospitalization  The history is provided by the patient.  Chest Pain  Pain location:  Epigastric Pain quality: pressure   Pain radiates to:  Upper back Pain severity:  Moderate Onset quality:  Gradual Duration:  1 day Timing:  Constant Progression:  Resolved Chronicity:  New Relieved by:  Nothing Worsened by:  Nothing Ineffective treatments:  None tried Associated symptoms: abdominal pain and diaphoresis   Associated symptoms: no dizziness, no fever, no headache, no nausea, no palpitations, no shortness of breath and no vomiting   Abdominal Pain  Associated symptoms: chest pain   Associated symptoms: no chills, no dysuria, no fever, no nausea, no shortness of breath and no vomiting     Past Medical History:  Diagnosis Date  . Gestational diabetes   . Hypertension      Patient Active Problem List   Diagnosis Date Noted  . Chest pain 06/13/2018  . Essential hypertension 06/13/2018  . Morbid obesity ([place]) 06/13/2018  . Hyperlipidemia, acquired 06/13/2018    Past Surgical History:  Procedure Laterality Date  . ECTOPIC PREGNANCY SURGERY       OB History    Gravida  2   Para      Term      Preterm      AB      Living        SAB      TAB      Ectopic      Multiple      Live Births               Home Medications    Prior to Admission medications   Medication Sig Start Date End Date Taking? Authorizing Provider  amLODipine (NORVASC) 10 MG tablet Take 10 mg by mouth daily.    [provider]  aspirin 325 MG tablet Take 325 mg by mouth daily.    [provider]  doxycycline (VIBRAMYCIN) 100 MG capsule Take 1 capsule (100 mg total) by mouth 2 (two) times daily. 11/29/14   Fredia Sorrow, MD  HYDROcodone-acetaminophen (NORCO/VICODIN) 5-325 MG per tablet Take 1-2 tablets by mouth every 6 hours as needed for pain and/or cough. 04/26/14   Pisciotta,  Elmyra Ricks, PA-C  lisinopril (PRINIVIL,ZESTRIL) 40 MG tablet Take 40 mg by mouth daily.    [provider]  lisinopril-hydrochlorothiazide (PRINZIDE,ZESTORETIC) 20-25 MG tablet Take by mouth.    [provider]  losartan (COZAAR) 100 MG tablet Take 100 mg by mouth daily.    [provider]  methocarbamol (ROBAXIN) 750 MG tablet Take 1-2 tablets (750-1,500 mg total) by mouth 3 (three) times daily as needed for muscle spasms. 01/29/18   Lorin Glass, PA-C  metoprolol (LOPRESSOR) 25 MG tablet Take 1 tablet (25 mg total) by mouth 2 (two) times daily. 04/26/14   Pisciotta, Elmyra Ricks, PA-C  Prenatal Vit-Fe Fumarate-FA (MULTIVITAMIN-PRENATAL) 27-0.8 MG TABS Take 1 tablet by mouth daily.    [provider]  triamterene-hydrochlorothiazide (DYAZIDE) 37.5-25 MG capsule Take 1 capsule by mouth daily.    [provider]    Family History  No family history on file.  Social History Social History   Tobacco Use  . Smoking status: Current Every Day Smoker    Packs/day: 0.50    Types: Cigarettes  . Smokeless tobacco: Never Used  Substance Use Topics  . Alcohol use: No  . Drug use: No     Allergies   Patient has no known allergies.   Review of Systems Review of Systems  Constitutional: Positive for diaphoresis. Negative for chills and fever.  HENT: Negative for congestion and rhinorrhea.   Eyes: Negative for redness and visual disturbance.  Respiratory: Negative for shortness of breath and wheezing.   Cardiovascular: Positive for chest pain. Negative for palpitations.  Gastrointestinal: Positive for abdominal pain. Negative for nausea and vomiting.  Genitourinary: Negative for dysuria and urgency.  Musculoskeletal: Negative for arthralgias and myalgias.  Skin: Negative for pallor and wound.  Neurological: Negative for dizziness and headaches.     Physical Exam Updated Vital Signs BP (!) 156/84 (BP Location: Left Arm)   Pulse (!) 52   Temp 98.3 F (36.8 C) (Oral)   Resp 18   Ht 5\' 5"  (1.651 m)   Wt 102.3 kg   SpO2 100%   BMI 37.54 kg/m   Physical Exam Vitals signs and nursing note reviewed.  Constitutional:      General: She is not in acute distress.    Appearance: She is well-developed. She is not diaphoretic.  HENT:     Head: Normocephalic and atraumatic.  Eyes:     Pupils: Pupils are equal, round, and reactive to light.  Neck:     Musculoskeletal: Normal range of motion and neck supple.  Cardiovascular:     Rate and Rhythm: Normal rate and regular rhythm.     Heart sounds: Murmur present. Systolic murmur present. No friction rub. No gallop.   Pulmonary:     Effort: Pulmonary effort is normal.     Breath sounds: No wheezing or rales.  Abdominal:     General: There is no distension.     Palpations: Abdomen is soft.     Tenderness: There is no abdominal tenderness.  Musculoskeletal:         General: No tenderness.  Skin:    General: Skin is warm and dry.  Neurological:     Mental Status: She is alert and oriented to person, place, and time.  Psychiatric:        Behavior: Behavior normal.      ED Treatments / Results  Labs (all labs ordered are listed, but only abnormal results are displayed) Labs Reviewed  COMPREHENSIVE METABOLIC PANEL - Abnormal;  Notable for the following components:      Result Value   Glucose, Bld 175 (*)    Creatinine, Ser 1.28 (*)    GFR calc non Af Amer 52 (*)    GFR calc Af Amer 60 (*)    All other components within normal limits  URINALYSIS, ROUTINE W REFLEX MICROSCOPIC - Abnormal; Notable for the following components:   Hgb urine dipstick SMALL (*)    All other components within normal limits  URINALYSIS, MICROSCOPIC (REFLEX) - Abnormal; Notable for the following components:   Bacteria, UA FEW (*)    All other components within normal limits  SARS CORONAVIRUS 2 (HOSP ORDER, PERFORMED IN [facility] VIA ABBOTT ID)  LIPASE, BLOOD  CBC  PREGNANCY, URINE  TROPONIN I  TROPONIN I  TROPONIN I    EKG EKG Interpretation  Date/Time:  Tuesday Jun 13 2018 13:49:09 EDT Ventricular Rate:  59 PR Interval:    QRS Duration: 107 QT Interval:  441 QTC Calculation: 437 R Axis:   34 Text Interpretation:  Sinus rhythm No significant change since last tracing Confirmed by Deno Etienne [phone]) on 06/13/2018 2:45:24 PM Also confirmed by Deno Etienne [phone]), editor Philomena Doheny [phone])  on 06/14/2018 7:38:04 AM   Radiology Dg Chest 2 View  Result Date: 06/13/2018 CLINICAL DATA:  43 year old female with chest pain EXAM: CHEST - 2 VIEW COMPARISON:  06/20/2017, 05/20/2016 FINDINGS: The heart size and mediastinal contours are within normal limits. Both lungs are clear. The visualized skeletal structures are unremarkable. IMPRESSION: Negative for acute cardiopulmonary disease Electronically Signed   By: Corrie Mckusick D.O.   On: 06/13/2018 12:32     Procedures Procedures (including critical care time) Discussed smoking cessation with patient and was they were offerred resources to help stop.  Total time was 5 min CPT code 99406.   Medications Ordered in ED Medications  alum & mag hydroxide-simeth (MAALOX/MYLANTA) 200-200-20 MG/5ML suspension 30 mL (30 mLs Oral Given 06/13/18 1249)    And  lidocaine (XYLOCAINE) 2 % viscous mouth solution 15 mL (15 mLs Oral Given 06/13/18 1249)     Initial Impression / Assessment and Plan / ED Course  I have reviewed the triage vital signs and the nursing notes.  Pertinent labs & imaging results that were available during my care of the patient were reviewed by me and considered in my medical decision making (see chart for details).        43 yo F with a chief complaint of palpitations.  Seems to come and go today.  Heart rate in the 60s on arrival here.  Normal sinus rhythm.  She is complaining of some shortness of breath on exertion and chest pressure will obtain a delta troponin.  She had symptoms similar to this about a year ago and so she went to [facility].  Her records were reviewed and she had a visit to [facility] in May 2019 where she had an overnight observation for atypical chest pain.  At that time she had a negative stress echo.  She states that she followed up as an outpatient but only for hypertension which is now better controlled.  Palpitations had spontaneously resolved until last night.  Had an episode here, HR into the 30's, lasted about 5 min.  Discussed with the cardiologist, Dr. Radford Pax, recommended obs at [facility] with serial trops.    Awaiting covid test.   The patients results and plan were reviewed and discussed.  Any x-rays performed were independently reviewed by myself.   Differential diagnosis were considered with the presenting HPI.  Medications  alum & mag hydroxide-simeth (MAALOX/MYLANTA) 200-200-20 MG/5ML suspension 30 mL (30 mLs  Oral Given 06/13/18 1249)    And  lidocaine (XYLOCAINE) 2 % viscous mouth solution 15 mL (15 mLs Oral Given 06/13/18 1249)    Vitals:   06/13/18 1630 06/13/18 1819 06/13/18 1856 06/13/18 2008  BP: 140/79 (!) 182/97 (!) 157/85 (!) 156/84  Pulse: (!) 56 (!) 56 (!) 57 (!) 52  Resp: 13 13  18   Temp:  98.8 F (37.1 C)  98.3 F (36.8 C)  TempSrc:  Oral  Oral  SpO2: 100% 99% 96% 100%  Weight:  102.3 kg    Height:  5\' 5"  (1.651 m)      Final diagnoses:  Atypical chest pain    Admission/ observation were discussed with the admitting physician, patient and/or family and they are comfortable with the plan.    Final Clinical Impressions(s) / ED Diagnoses   Final diagnoses:  Atypical chest pain    ED Discharge Orders    None       Deno Etienne, DO 06/15/18 0745

## 2018-06-13 NOTE — Plan of Care (Signed)
Discussed case with Dr. Maryan Rued. Jodi Porter is a 43 year old female with pmh HTN, HLD, obesity, and family history of heart disease; who presented with complaints of chest/epigastric discomfort lasting minutes and then self resolving.  Worse associated symptoms of shortness of breath. CXR and initial troponin negative.  While in ED patient had a witnessed episode where her heart rates were in the 30s noted to be sinus bradycardia per her strips.  Case was discussed with Dr. Golden Hurter who recommended monitoring with serial troponins.  Formally consult cardiology if needed.  Transferring to a cardiac telemetry bed as observation.

## 2018-06-13 NOTE — H&P (Signed)
History and Physical   Jodi Porter QPY:[id number] DOB: 15-Jan-1976 DOA: 06/13/2018  Referring MD/NP/PA: Dr. Tyrone Nine  PCP: Langston Masker Lutricia Horsfall, MD   Outpatient Specialists: None  Patient coming from: Home  Chief Complaint: Chest pain  HPI: Jodi Porter is a 43 y.o. female with medical history significant of Hypertension, hyperlipidemia, morbid obesity, family history of coronary artery disease who presented with transient chest pain that lasts minutes after waking up this morning.  Patient reports her symptoms usually coming in the middle of the night.  She feels palpitation then pressure in her chest.  She loses her breath completely at that point.  She feels suffocated.  No history of anxiety disorder.  No childhood trauma, no history of panic attacks.  She has strong family history of coronary artery disease.  She was notably bradycardic at some point at [facility].  She has not had any previous cardiac work-up.  Patient's case was discussed with cardiology who recommends admission to rule out MI..  ED Course: Temperature is 98.8 blood pressure 182/97 pulse 52 respiratory 23 oxygen sat 96% on room air.  CBC entirely within normal chemistry entirely within normal except for creatinine 1.28.  Troponin is also negative at 0.03.  COVID-19 test is negative.  EKG showed no significant findings.  Chest x-ray also showed no active findings.  Patient being admitted for rule out MI  Review of Systems: As per HPI otherwise 10 point review of systems negative.    Past Medical History:  Diagnosis Date  . Gestational diabetes   . Hypertension     Past Surgical History:  Procedure Laterality Date  . ECTOPIC PREGNANCY SURGERY       reports that she has been smoking cigarettes. She has been smoking about 0.50 packs per day. She has never used smokeless tobacco. She reports that she does not drink alcohol or use drugs.  No Known Allergies  No family history on file.   Prior to  Admission medications   Medication Sig Start Date End Date Taking? Authorizing Provider  amLODipine (NORVASC) 10 MG tablet Take 10 mg by mouth daily.    [provider]  aspirin 325 MG tablet Take 325 mg by mouth daily.    [provider]  doxycycline (VIBRAMYCIN) 100 MG capsule Take 1 capsule (100 mg total) by mouth 2 (two) times daily. 11/29/14   Fredia Sorrow, MD  HYDROcodone-acetaminophen (NORCO/VICODIN) 5-325 MG per tablet Take 1-2 tablets by mouth every 6 hours as needed for pain and/or cough. 04/26/14   Pisciotta, Elmyra Ricks, PA-C  lisinopril (PRINIVIL,ZESTRIL) 40 MG tablet Take 40 mg by mouth daily.    [provider]  lisinopril-hydrochlorothiazide (PRINZIDE,ZESTORETIC) 20-25 MG tablet Take by mouth.    [provider]  losartan (COZAAR) 100 MG tablet Take 100 mg by mouth daily.    [provider]  methocarbamol (ROBAXIN) 750 MG tablet Take 1-2 tablets (750-1,500 mg total) by mouth 3 (three) times daily as needed for muscle spasms. 01/29/18   Lorin Glass, PA-C  metoprolol (LOPRESSOR) 25 MG tablet Take 1 tablet (25 mg total) by mouth 2 (two) times daily. 04/26/14   Pisciotta, Elmyra Ricks, PA-C  Prenatal Vit-Fe Fumarate-FA (MULTIVITAMIN-PRENATAL) 27-0.8 MG TABS Take 1 tablet by mouth daily.    [provider]  triamterene-hydrochlorothiazide (DYAZIDE) 37.5-25 MG capsule Take 1 capsule by mouth daily.    [provider]    Physical Exam: Vitals:   06/13/18 1630 06/13/18 1819 06/13/18 1856 06/13/18 2008  BP: 140/79 (!) 182/97 (!) 157/85 (!) 156/84  Pulse: (!) 56 (!) 56 (!) 57 (!) 52  Resp: 13 13  18   Temp:  98.8 F (37.1 C)  98.3 F (36.8 C)  TempSrc:  Oral  Oral  SpO2: 100% 99% 96% 100%  Weight:  102.3 kg    Height:  5\' 5"  (1.651 m)        Constitutional: NAD, calm, comfortable, morbidly obese Vitals:   06/13/18 1630 06/13/18 1819 06/13/18 1856 06/13/18 2008  BP: 140/79 (!) 182/97 (!) 157/85 (!) 156/84  Pulse:  (!) 56 (!) 56 (!) 57 (!) 52  Resp: 13 13  18   Temp:  98.8 F (37.1 C)  98.3 F (36.8 C)  TempSrc:  Oral  Oral  SpO2: 100% 99% 96% 100%  Weight:  102.3 kg    Height:  5\' 5"  (1.651 m)     Eyes: PERRL, lids and conjunctivae normal ENMT: Mucous membranes are moist. Posterior pharynx clear of any exudate or lesions.Normal dentition.  Neck: normal, supple, no masses, no thyromegaly Respiratory: clear to auscultation bilaterally, no wheezing, no crackles. Normal respiratory effort. No accessory muscle use.  Cardiovascular: Regular rate and rhythm, no murmurs / rubs / gallops. No extremity edema. 2+ pedal pulses. No carotid bruits.  Abdomen: Obese, no tenderness, no masses palpated. No hepatosplenomegaly. Bowel sounds positive.  Musculoskeletal: no clubbing / cyanosis. No joint deformity upper and lower extremities. Good ROM, no contractures. Normal muscle tone.  Skin: no rashes, lesions, ulcers. No induration Neurologic: CN 2-12 grossly intact. Sensation intact, DTR normal. Strength 5/5 in all 4.  Psychiatric: Normal judgment and insight. Alert and oriented x 3. Normal mood.     Labs on Admission: I have personally reviewed following labs and imaging studies  CBC: Recent Labs  Lab 06/13/18 1225  WBC 7.0  HGB 13.5  HCT 42.2  MCV 83.7  PLT 161   Basic Metabolic Panel: Recent Labs  Lab 06/13/18 1225  NA 137  K 3.8  CL 105  CO2 26  GLUCOSE 175*  BUN 13  CREATININE 1.28*  CALCIUM 8.9   GFR: Estimated Creatinine Clearance: 67.9 mL/min (A) (by C-G formula based on SCr of 1.28 mg/dL (H)). Liver Function Tests: Recent Labs  Lab 06/13/18 1225  AST 16  ALT 14  ALKPHOS 59  BILITOT 0.3  PROT 7.1  ALBUMIN 3.6   Recent Labs  Lab 06/13/18 1225  LIPASE 29   No results for input(s): AMMONIA in the last 168 hours. Coagulation Profile: No results for input(s): INR, PROTIME in the last 168 hours. Cardiac Enzymes: Recent Labs  Lab 06/13/18 1225 06/13/18 1601 06/13/18 1942   TROPONINI <0.03 <0.03 <0.03   BNP (last 3 results) No results for input(s): PROBNP in the last 8760 hours. HbA1C: No results for input(s): HGBA1C in the last 72 hours. CBG: No results for input(s): GLUCAP in the last 168 hours. Lipid Profile: No results for input(s): CHOL, HDL, LDLCALC, TRIG, CHOLHDL, LDLDIRECT in the last 72 hours. Thyroid Function Tests: No results for input(s): TSH, T4TOTAL, FREET4, T3FREE, THYROIDAB in the last 72 hours. Anemia Panel: No results for input(s): VITAMINB12, FOLATE, FERRITIN, TIBC, IRON, RETICCTPCT in the last 72 hours. Urine analysis:    Component Value Date/Time   COLORURINE YELLOW 06/13/2018 [place] 06/13/2018 1225   LABSPEC 1.025 06/13/2018 1225   PHURINE 6.5 06/13/2018 1225   GLUCOSEU NEGATIVE 06/13/2018 1225   HGBUR SMALL (A) 06/13/2018 [place] 06/13/2018  [place] 06/13/2018 [place] 06/13/2018 1225   UROBILINOGEN 1.0 04/11/2012 1042   NITRITE NEGATIVE 06/13/2018 [place] 06/13/2018 1225   Sepsis Labs: @LABRCNTIP (procalcitonin:4,lacticidven:4) ) Recent Results (from the past 240 hour(s))  SARS Coronavirus 2 (Hosp order,Performed in [place] lab via [employer] ID)     Status: None   Collection Time: 06/13/18  2:25 PM  Result Value Ref Range Status   SARS Coronavirus 2 (Abbott ID Now) NEGATIVE NEGATIVE Final    Comment: (NOTE) Interpretive Result Comment(s): COVID 19 Positive SARS CoV 2 target nucleic acids are DETECTED. The SARS CoV 2 RNA is generally detectable in upper and lower respiratory specimens during the acute phase of infection.  Positive results are indicative of active infection with SARS CoV 2.  Clinical correlation with patient history and other diagnostic information is necessary to determine patient infection status.  Positive results do not rule out bacterial infection or coinfection with other viruses. The expected result  is Negative. COVID 19 Negative SARS CoV 2 target nucleic acids are NOT DETECTED. The SARS CoV 2 RNA is generally detectable in upper and lower respiratory specimens during the acute phase of infection.  Negative results do not preclude SARS CoV 2 infection, do not rule out coinfections with other pathogens, and should not be used as the sole basis for treatment or other patient management decisions.  Negative results must be combined with clinical  observations, patient history, and epidemiological information. The expected result is Negative. Invalid Presence or absence of SARS CoV 2 nucleic acids cannot be determined. Repeat testing was performed on the submitted specimen and repeated Invalid results were obtained.  If clinically indicated, additional testing on a new specimen with an alternate test methodology [phone]) is advised.  The SARS CoV 2 RNA is generally detectable in upper and lower respiratory specimens during the acute phase of infection. The expected result is Negative. Fact Sheet for Patients:  GolfingFamily.no Fact Sheet for Healthcare Providers: https://www.hernandez-brewer.com/ This test is not yet approved or cleared by the [place] FDA and has been authorized for detection and/or diagnosis of SARS CoV 2 by FDA under an Emergency Use Authorization (EUA).  This EUA will remain in effect (meaning this test can be used) for the duration of the COVID19 d eclaration under Section 564(b)(1) of the Act, 21 U.S.C. section 831 314 9625 3(b)(1), unless the authorization is terminated or revoked sooner. Performed at [facility], [address] [place], [place] [address]      Radiological Exams on Admission: Dg Chest 2 View  Result Date: 06/13/2018 CLINICAL DATA:  43 year old female with chest pain EXAM: CHEST - 2 VIEW COMPARISON:  06/20/2017, 05/20/2016 FINDINGS: The heart size and mediastinal contours are within normal  limits. Both lungs are clear. The visualized skeletal structures are unremarkable. IMPRESSION: Negative for acute cardiopulmonary disease Electronically Signed   By: Corrie Mckusick D.O.   On: 06/13/2018 12:32    EKG: Independently reviewed.  Sinus bradycardia with a rate of 59, normal intervals, no significant speak ST changes  Assessment/Plan Principal Problem:   Chest pain Active Problems:   Essential hypertension   Morbid obesity (HCC)   Hyperlipidemia, acquired     #1 chest pain: Patient's symptoms come and go.  She does have risk factors including hypertension hyperlipidemia morbid obesity and family history.  We will cycle enzymes.  Get echocardiogram in the morning.  Patient may benefit from cardiac stress testing at some point  if enzymes remain negative.  Cardiology consultation if enzymes turn positive.  #2 hypertension: Obtain home regimen and continue.  #3 hyperlipidemia: Initiate statins.  Check fasting lipid panel.  #4 morbid obesity: Dietary counseling.   DVT prophylaxis: Lovenox full dose Code Status: Full code Family Communication: Discussed care with the patient no family available Disposition Plan: Home Consults called: None Admission status: Observation  Severity of Illness: The appropriate patient status for this patient is OBSERVATION. Observation status is judged to be reasonable and necessary in order to provide the required intensity of service to ensure the patient's safety. The patient's presenting symptoms, physical exam findings, and initial radiographic and laboratory data in the context of their medical condition is felt to place them at decreased risk for further clinical deterioration. Furthermore, it is anticipated that the patient will be medically stable for discharge from the hospital within 2 midnights of admission. The following factors support the patient status of observation.   " The patient's presenting symptoms include chest pain. " The  physical exam findings include no significant findings. " The initial radiographic and laboratory data are normal at this point.     Barbette Merino MD [facility] Pager [phone]  If 7PM-7AM, please contact night-coverage [email or web address] Password [facility]  06/13/2018, 11:21 PM

## 2018-06-13 NOTE — Progress Notes (Signed)
Pt stated she hadn't eaten in 24 hour and was hungry pt was provided with apple juice, graham crackers and a [place] sandwich, while she waited on her meal tray to come from dietary. Meal tray arrived and she refused to eat it stating she hates fish. Pt called her own meal order in. Dr Elson Areas  Notified of pt 's decision to leave Pt stated she took out her own IV when I offered to take it out for her. Pt was already fully dressed and was walking off the unit  Pt was ask briefly to return to sign her AMA paper which she did. AMA papers signed and witnessed and placed in chart.

## 2018-06-14 NOTE — Discharge Summary (Signed)
Physician Discharge Summary  Jodi Porter WCH:[id number] DOB: 02/26/1975 DOA: 06/13/2018  PCP: Alwyn Ren, MD  Admit date: 06/13/2018 Discharge date: 06/14/2018  Time spent: 25 minutes  Recommendations for Outpatient Follow-up:  1. None    Discharge Diagnoses:  Principal Problem:   Chest pain Active Problems:   Essential hypertension   Morbid obesity ([place])   Hyperlipidemia, acquired   Discharge Condition: Left AMA  Diet recommendation: None given  Filed Weights   06/13/18 1201 06/13/18 1819  Weight: 95.3 kg 102.3 kg    History of present illness:  Jodi Porter is a 43 y.o. female with medical history significant of Hypertension, hyperlipidemia, morbid obesity, family history of coronary artery disease who presented with transient chest pain that lasts minutes after waking up this morning.  Patient reports her symptoms usually coming in the middle of the night.  She feels palpitation then pressure in her chest.  She loses her breath completely at that point.  She feels suffocated.  No history of anxiety disorder.  No childhood trauma, no history of panic attacks.  She has strong family history of coronary artery disease.  She was notably bradycardic at some point at [facility].  She has not had any previous cardiac work-up.  Patient's case was discussed with cardiology who recommends admission to rule out MI.Marland Kitchen  Hospital Course:  Patient was just admitted to the hospital for chest pain, ruled out.  She arrived from [place].  While being settled by the nurses she apparently became belligerent and angry about the food.  She decided to leave [place].  She did not stay to complete her work-up in the hospital.  Procedures:  None (i.e. Studies not automatically included, echos, thoracentesis, etc; not x-rays)  Consultations:  None  Discharge Exam: Vitals:   06/13/18 1856 06/13/18 2008  BP: (!) 157/85 (!) 156/84  Pulse: (!)  57 (!) 52  Resp:  18  Temp:  98.3 F (36.8 C)  SpO2: 96% 100%    General: Morbidly obese Cardiovascular: Regular rate and rhythm Respiratory: Good air entry bilaterally  Discharge Instructions    Allergies as of 06/13/2018   No Known Allergies     Medication List    ASK your doctor about these medications   amLODipine 10 MG tablet Commonly known as:  NORVASC Take 10 mg by mouth daily.   aspirin 325 MG tablet Take 325 mg by mouth daily.   doxycycline 100 MG capsule Commonly known as:  VIBRAMYCIN Take 1 capsule (100 mg total) by mouth 2 (two) times daily.   HYDROcodone-acetaminophen 5-325 MG tablet Commonly known as:  NORCO/VICODIN Take 1-2 tablets by mouth every 6 hours as needed for pain and/or cough.   lisinopril 40 MG tablet Commonly known as:  ZESTRIL Take 40 mg by mouth daily.   lisinopril-hydrochlorothiazide 20-25 MG tablet Commonly known as:  ZESTORETIC Take by mouth.   losartan 100 MG tablet Commonly known as:  COZAAR Take 100 mg by mouth daily.   methocarbamol 750 MG tablet Commonly known as:  ROBAXIN Take 1-2 tablets (750-1,500 mg total) by mouth 3 (three) times daily as needed for muscle spasms.   metoprolol tartrate 25 MG tablet Commonly known as:  LOPRESSOR Take 1 tablet (25 mg total) by mouth 2 (two) times daily.   multivitamin-prenatal 27-0.8 MG Tabs tablet Take 1 tablet by mouth daily.   triamterene-hydrochlorothiazide 37.5-25 MG capsule Commonly known as:  DYAZIDE Take 1 capsule by mouth  daily.      No Known Allergies    The results of significant diagnostics from this hospitalization (including imaging, microbiology, ancillary and laboratory) are listed below for reference.    Significant Diagnostic Studies: Dg Chest 2 View  Result Date: 06/13/2018 CLINICAL DATA:  43 year old female with chest pain EXAM: CHEST - 2 VIEW COMPARISON:  06/20/2017, 05/20/2016 FINDINGS: The heart size and mediastinal contours are within normal  limits. Both lungs are clear. The visualized skeletal structures are unremarkable. IMPRESSION: Negative for acute cardiopulmonary disease Electronically Signed   By: Corrie Mckusick D.O.   On: 06/13/2018 12:32    Microbiology: Recent Results (from the past 240 hour(s))  SARS Coronavirus 2 (Hosp order,Performed in [facility] lab via [employer] ID)     Status: None   Collection Time: 06/13/18  2:25 PM  Result Value Ref Range Status   SARS Coronavirus 2 ([employer] ID Now) NEGATIVE NEGATIVE Final    Comment: (NOTE) Interpretive Result Comment(s): COVID 19 Positive SARS CoV 2 target nucleic acids are DETECTED. The SARS CoV 2 RNA is generally detectable in upper and lower respiratory specimens during the acute phase of infection.  Positive results are indicative of active infection with SARS CoV 2.  Clinical correlation with patient history and other diagnostic information is necessary to determine patient infection status.  Positive results do not rule out bacterial infection or coinfection with other viruses. The expected result is Negative. COVID 19 Negative SARS CoV 2 target nucleic acids are NOT DETECTED. The SARS CoV 2 RNA is generally detectable in upper and lower respiratory specimens during the acute phase of infection.  Negative results do not preclude SARS CoV 2 infection, do not rule out coinfections with other pathogens, and should not be used as the sole basis for treatment or other patient management decisions.  Negative results must be combined with clinical  observations, patient history, and epidemiological information. The expected result is Negative. Invalid Presence or absence of SARS CoV 2 nucleic acids cannot be determined. Repeat testing was performed on the submitted specimen and repeated Invalid results were obtained.  If clinically indicated, additional testing on a new specimen with an alternate test methodology [phone]) is advised.  The SARS CoV 2 RNA is generally  detectable in upper and lower respiratory specimens during the acute phase of infection. The expected result is Negative. Fact Sheet for Patients:  GolfingFamily.no Fact Sheet for Healthcare Providers: https://www.hernandez-brewer.com/ This test is not yet approved or cleared by the [place] FDA and has been authorized for detection and/or diagnosis of SARS CoV 2 by FDA under an Emergency Use Authorization (EUA).  This EUA will remain in effect (meaning this test can be used) for the duration of the COVID19 d eclaration under Section 564(b)(1) of the Act, 21 U.S.C. section [phone] 3(b)(1), unless the authorization is terminated or revoked sooner. Performed at [facility], [address] [place], [place] [address]      Labs: Basic Metabolic Panel: Recent Labs  Lab 06/13/18 1225  NA 137  K 3.8  CL 105  CO2 26  GLUCOSE 175*  BUN 13  CREATININE 1.28*  CALCIUM 8.9   Liver Function Tests: Recent Labs  Lab 06/13/18 1225  AST 16  ALT 14  ALKPHOS 59  BILITOT 0.3  PROT 7.1  ALBUMIN 3.6   Recent Labs  Lab 06/13/18 1225  LIPASE 29   No results for input(s): AMMONIA in the last 168 hours. CBC: Recent Labs  Lab 06/13/18  1225  WBC 7.0  HGB 13.5  HCT 42.2  MCV 83.7  PLT 210   Cardiac Enzymes: Recent Labs  Lab 06/13/18 1225 06/13/18 1601 06/13/18 1942  TROPONINI <0.03 <0.03 <0.03   BNP: BNP (last 3 results) No results for input(s): BNP in the last 8760 hours.  ProBNP (last 3 results) No results for input(s): PROBNP in the last 8760 hours.  CBG: No results for input(s): GLUCAP in the last 168 hours.     SignedBarbette Merino MD.  [facility] 06/14/2018, 7:49 PM

## 2019-01-01 ENCOUNTER — Other Ambulatory Visit: Payer: Self-pay

## 2019-01-01 ENCOUNTER — Emergency Department (HOSPITAL_BASED_OUTPATIENT_CLINIC_OR_DEPARTMENT_OTHER)
Admission: EM | Admit: 2019-01-01 | Discharge: 2019-01-01 | Disposition: A | Discharge status: Home / Self Care | Payer: Medicaid Other | Attending: Emergency Medicine | Admitting: Emergency Medicine

## 2019-01-01 ENCOUNTER — Encounter (HOSPITAL_BASED_OUTPATIENT_CLINIC_OR_DEPARTMENT_OTHER): Payer: Self-pay | Admitting: *Deleted

## 2019-01-01 DIAGNOSIS — F17210 Nicotine dependence, cigarettes, uncomplicated: Secondary | ICD-10-CM | POA: Insufficient documentation

## 2019-01-01 DIAGNOSIS — E785 Hyperlipidemia, unspecified: Secondary | ICD-10-CM | POA: Insufficient documentation

## 2019-01-01 DIAGNOSIS — L02419 Cutaneous abscess of limb, unspecified: Secondary | ICD-10-CM

## 2019-01-01 DIAGNOSIS — G44209 Tension-type headache, unspecified, not intractable: Secondary | ICD-10-CM | POA: Insufficient documentation

## 2019-01-01 DIAGNOSIS — I10 Essential (primary) hypertension: Secondary | ICD-10-CM | POA: Insufficient documentation

## 2019-01-01 DIAGNOSIS — L02412 Cutaneous abscess of left axilla: Secondary | ICD-10-CM | POA: Insufficient documentation

## 2019-01-01 LAB — BASIC METABOLIC PANEL
Anion gap: 8 (ref 5–15)
BUN: 11 mg/dL (ref 6–20)
CO2: 28 mmol/L (ref 22–32)
Calcium: 9.2 mg/dL (ref 8.9–10.3)
Chloride: 103 mmol/L (ref 98–111)
Creatinine, Ser: 1.12 mg/dL — ABNORMAL HIGH (ref 0.44–1.00)
GFR calc Af Amer: >60 mL/min (ref >60)
GFR calc non Af Amer: >60 mL/min (ref >60)
Glucose, Bld: 84 mg/dL (ref 70–99)
Potassium: 3.7 mmol/L (ref 3.5–5.1)
Sodium: 139 mmol/L (ref 135–145)

## 2019-01-01 LAB — CBC WITH DIFFERENTIAL/PLATELET
Abs Immature Granulocytes: 0.01 10*3/uL (ref 0.00–0.07)
Basophils Absolute: 0 10*3/uL (ref 0.0–0.1)
Basophils Relative: 1 %
Eosinophils Absolute: 0.1 10*3/uL (ref 0.0–0.5)
Eosinophils Relative: 1 %
HCT: 47.3 % — ABNORMAL HIGH (ref 36.0–46.0)
Hemoglobin: 15 g/dL (ref 12.0–15.0)
Immature Granulocytes: 0 %
Lymphocytes Relative: 41 %
Lymphs Abs: 3.4 10*3/uL (ref 0.7–4.0)
MCH: 27.1 pg (ref 26.0–34.0)
MCHC: 31.7 g/dL (ref 30.0–36.0)
MCV: 85.4 fL (ref 80.0–100.0)
Monocytes Absolute: 0.7 10*3/uL (ref 0.1–1.0)
Monocytes Relative: 8 %
Neutro Abs: 3.9 10*3/uL (ref 1.7–7.7)
Neutrophils Relative %: 49 %
Platelets: 271 10*3/uL (ref 150–400)
RBC: 5.54 MIL/uL — ABNORMAL HIGH (ref 3.87–5.11)
RDW: 14.2 % (ref 11.5–15.5)
WBC: 8.1 10*3/uL (ref 4.0–10.5)
nRBC: 0 % (ref 0.0–0.2)

## 2019-01-01 MED ORDER — AMLODIPINE BESYLATE 10 MG PO TABS: 10.0000 mg | ORAL_TABLET | Freq: Every day | ORAL | 0 refills | Status: DC

## 2019-01-01 MED ORDER — KETOROLAC TROMETHAMINE 30 MG/ML IJ SOLN
30.0000 mg | Freq: Once | INTRAMUSCULAR | Status: AC
  Administered 2019-01-01: 30 mg via INTRAMUSCULAR
  Filled 2019-01-01: qty 1

## 2019-01-01 MED ORDER — DOXYCYCLINE HYCLATE 100 MG PO CAPS: 100.0000 mg | ORAL_CAPSULE | Freq: Two times a day (BID) | ORAL | 0 refills | Status: AC

## 2019-01-01 MED ORDER — LISINOPRIL-HYDROCHLOROTHIAZIDE 20-25 MG PO TABS: 1.0000 | ORAL_TABLET | Freq: Every day | ORAL | 0 refills | Status: DC

## 2019-01-01 NOTE — Discharge Instructions (Signed)

## 2019-01-01 NOTE — ED Provider Notes (Signed)
Emergency Department Provider Note   I have reviewed the triage vital signs and the nursing notes.   HISTORY  Chief Complaint Headache   HPI Jodi Porter is a 43 y.o. female with PMH of HTN, HLD, and elevated BMI presents to the emergency department for evaluation of headache over the past 2 days.  She describes a bilateral throbbing type headache worse with movement.  Denies any head injury.  No fevers or chills.  No numbness, weakness, vision change.  No jaw claudication symptoms.  No similar headache in the past.  She denies any sudden onset, maximal intensity headache symptoms.  She has been taking her mom's blood pressure medication (amlodipine and lisinopril) at home but runs out of that additional medication today.  In the past, she has taken those medicines plus HCTZ with relatively good blood pressure control.  She does not currently have a primary care doctor.  She did have an admission for chest pain in May but left AMA at that time due to being concerned regarding COVID 19 patient's on her unit in the hospital.    Past Medical History:  Diagnosis Date  . Gestational diabetes   . Hypertension     Patient Active Problem List   Diagnosis Date Noted  . Chest pain 06/13/2018  . Essential hypertension 06/13/2018  . Morbid obesity ([place]) 06/13/2018  . Hyperlipidemia, acquired 06/13/2018    Past Surgical History:  Procedure Laterality Date  . ECTOPIC PREGNANCY SURGERY      Allergies Patient has no known allergies.  No family history on file.  Social History Social History   Tobacco Use  . Smoking status: Current Every Day Smoker    Packs/day: 0.50    Types: Cigarettes  . Smokeless tobacco: Never Used  Substance Use Topics  . Alcohol use: No  . Drug use: No    Review of Systems  Constitutional: No fever/chills Eyes: No visual changes. ENT: No sore throat. Cardiovascular: Denies chest pain. Positive elevated BP.  Respiratory: Denies shortness of  breath. Gastrointestinal: No abdominal pain.  No nausea, no vomiting.  No diarrhea.  No constipation. Genitourinary: Negative for dysuria. Musculoskeletal: Negative for back pain. Skin: Negative for rash. Neurological: Negative for focal weakness or numbness. Positive HA.   10-point ROS otherwise negative.  ____________________________________________   PHYSICAL EXAM:  VITAL SIGNS: ED Triage Vitals  Enc Vitals Group     BP 01/01/19 1413 (!) 153/89     Pulse Rate 01/01/19 1413 62     Resp 01/01/19 1413 16     Temp 01/01/19 1413 98.4 F (36.9 C)     Temp Source 01/01/19 1413 Oral     SpO2 01/01/19 1413 100 %     Weight 01/01/19 1409 225 lb (102.1 kg)     Height 01/01/19 1409 5\' 4"  (1.626 m)   Constitutional: Alert and oriented. Well appearing and in no acute distress. Eyes: Conjunctivae are normal. PERRL.  Head: Atraumatic. No tenderness to palpation of the bilateral temples.  Nose: No congestion/rhinnorhea. Mouth/Throat: Mucous membranes are moist.   Neck: No stridor.  Cardiovascular: Normal rate, regular rhythm. Good peripheral circulation. Grossly normal heart sounds.   Respiratory: Normal respiratory effort.  No retractions. Lungs CTAB. Gastrointestinal: Soft and nontender. No distention.  Musculoskeletal: No lower extremity tenderness nor edema. No gross deformities of extremities. Neurologic:  Normal speech and language. No gross focal neurologic deficits are appreciated.  Skin:  Skin is warm and dry. Draining area under the left arm with  purulence. No surrounding cellulitis. Some satellite lesions noted as well ?  Hidradenitis.    ____________________________________________   LABS (all labs ordered are listed, but only abnormal results are displayed)  Labs Reviewed  BASIC METABOLIC PANEL - Abnormal; Notable for the following components:      Result Value   Creatinine, Ser 1.12 (*)    All other components within normal limits  CBC WITH DIFFERENTIAL/PLATELET -  Abnormal; Notable for the following components:   RBC 5.54 (*)    HCT 47.3 (*)    All other components within normal limits   ____________________________________________   PROCEDURES  Procedure(s) performed:   Procedures  None  ____________________________________________   INITIAL IMPRESSION / ASSESSMENT AND PLAN / ED COURSE  Pertinent labs & imaging results that were available during my care of the patient were reviewed by me and considered in my medical decision making (see chart for details).   Patient presents to the emergency department with headache over the past 2 days.  No sudden onset, maximal intensity headache symptoms to suspect subarachnoid hemorrhage, intracranial mass, or infectious etiology.  Suspect tension headache.  Blood pressure here is slightly elevated at 153/89.  Plan to refill the patient's amlodipine and lisinopril as she has been taking her family members blood pressure medication to this point.  We will attempt to have her reestablish care with PCP and provide contact information at discharge.  No neuro deficits.  No evidence on exam to suspect giant cell arteritis.  I do not feel she would benefit from emergency CT imaging of the head at this time or transfer for other advanced neuro imaging.   05:43 PM  Called back to the patient's room to discuss an area under her left axilla.  This area is draining and seems most consistent with hidradenitis.  With active draining I do not feel this requires incision and drainage.  We will start the patient on antibiotics at discharge and refer to PCP for possible dermatology referral if symptoms persist.  ____________________________________________  FINAL CLINICAL IMPRESSION(S) / ED DIAGNOSES  Final diagnoses:  Acute non intractable tension-type headache  Essential hypertension  Abscess of axillary region    MEDICATIONS GIVEN DURING THIS VISIT:  Medications  ketorolac (TORADOL) 30 MG/ML injection 30 mg (30 mg  Intramuscular Given 01/01/19 1736)    Note:  This document was prepared using Dragon voice recognition software and may include unintentional dictation errors.  Nanda Quinton, MD, [facility]    Lanett Lasorsa, Wonda Olds, MD 01/01/19 [phone]

## 2019-01-01 NOTE — ED Triage Notes (Signed)
Headache x 2 days. She has been out of BP medication for over a year.

## 2019-08-23 ENCOUNTER — Other Ambulatory Visit: Payer: Self-pay

## 2019-08-23 ENCOUNTER — Encounter (HOSPITAL_BASED_OUTPATIENT_CLINIC_OR_DEPARTMENT_OTHER): Payer: Self-pay | Admitting: Emergency Medicine

## 2019-08-23 ENCOUNTER — Emergency Department (HOSPITAL_BASED_OUTPATIENT_CLINIC_OR_DEPARTMENT_OTHER)
Admission: EM | Admit: 2019-08-23 | Discharge: 2019-08-23 | Disposition: A | Discharge status: Home / Self Care | Payer: Medicaid Other | Attending: Emergency Medicine | Admitting: Emergency Medicine

## 2019-08-23 DIAGNOSIS — R21 Rash and other nonspecific skin eruption: Secondary | ICD-10-CM

## 2019-08-23 DIAGNOSIS — F17210 Nicotine dependence, cigarettes, uncomplicated: Secondary | ICD-10-CM | POA: Insufficient documentation

## 2019-08-23 DIAGNOSIS — Z79899 Other long term (current) drug therapy: Secondary | ICD-10-CM | POA: Insufficient documentation

## 2019-08-23 DIAGNOSIS — Z7982 Long term (current) use of aspirin: Secondary | ICD-10-CM | POA: Insufficient documentation

## 2019-08-23 DIAGNOSIS — I10 Essential (primary) hypertension: Secondary | ICD-10-CM | POA: Insufficient documentation

## 2019-08-23 MED ORDER — LISINOPRIL 10 MG PO TABS
40.0000 mg | ORAL_TABLET | Freq: Once | ORAL | Status: AC
  Administered 2019-08-23: 40 mg via ORAL
  Filled 2019-08-23: qty 4

## 2019-08-23 MED ORDER — AMLODIPINE BESYLATE 5 MG PO TABS
10.0000 mg | ORAL_TABLET | Freq: Once | ORAL | Status: AC
  Administered 2019-08-23: 10 mg via ORAL
  Filled 2019-08-23: qty 2

## 2019-08-23 MED ORDER — HYDROCHLOROTHIAZIDE 25 MG PO TABS: 25.0000 mg | ORAL_TABLET | Freq: Every day | ORAL | 0 refills | Status: DC

## 2019-08-23 MED ORDER — AMLODIPINE BESYLATE 10 MG PO TABS: 10.0000 mg | ORAL_TABLET | Freq: Every day | ORAL | 0 refills | Status: DC

## 2019-08-23 MED ORDER — LISINOPRIL 20 MG PO TABS: 20.0000 mg | ORAL_TABLET | Freq: Every day | ORAL | 0 refills | Status: DC

## 2019-08-23 MED ORDER — CLONIDINE HCL 0.1 MG PO TABS
0.1000 mg | ORAL_TABLET | Freq: Once | ORAL | Status: AC
  Administered 2019-08-23: 0.1 mg via ORAL
  Filled 2019-08-23: qty 1

## 2019-08-23 NOTE — ED Notes (Signed)
ED Provider at bedside. 

## 2019-08-23 NOTE — ED Provider Notes (Signed)
[place] EMERGENCY DEPARTMENT Provider Note   CSN: [id number] Arrival date & time: 08/23/19  1000     History Chief Complaint  Patient presents with   Rash    Jodi Porter is a 44 y.o. female.  HPI She reports she has been getting spots of itchy rash on her hands and feet.  This is been present for 2 months.  She reports an area will start out as a flat darkened area, become elevated and then drier.  She has been putting cortisone cream on it with no relief.  No associated symptoms.  Nothing she can think of that she is in contact with for allergy.  Patient reports that she is in a monogamous marital relationship and years.  Does not have concern for possible STD.  Patient also reports she is a refill on her blood pressure medications.  She reports she ran out 2 weeks ago.  She denies any headache or blurred vision.  No chest pain or shortness of breath.  She reports she has mild COPD continues to smoke so she has intermittent cough.  No swelling or pain of the legs.    Past Medical History:  Diagnosis Date   Gestational diabetes    Gestational diabetes    Hypertension     Patient Active Problem List   Diagnosis Date Noted   Chest pain 06/13/2018   Essential hypertension 06/13/2018   Morbid obesity ([place]) 06/13/2018   Hyperlipidemia, acquired 06/13/2018    Past Surgical History:  Procedure Laterality Date   ECTOPIC PREGNANCY SURGERY       OB History    Gravida  2   Para      Term      Preterm      AB      Living        SAB      TAB      Ectopic      Multiple      Live Births              No family history on file.  Social History   Tobacco Use   Smoking status: Current Every Day Smoker    Packs/day: 0.50    Types: Cigarettes   Smokeless tobacco: Never Used  Substance Use Topics   Alcohol use: No   Drug use: No    Home Medications Prior to Admission medications   Medication Sig Start Date End Date  Taking? Authorizing Provider  amLODipine (NORVASC) 10 MG tablet Take 1 tablet (10 mg total) by mouth daily. 01/01/19 01/31/19  Long, Wonda Olds, MD  amLODipine (NORVASC) 10 MG tablet Take 1 tablet (10 mg total) by mouth daily. 08/23/19   Charlesetta Shanks, MD  aspirin 325 MG tablet Take 325 mg by mouth daily.    [provider]  hydrochlorothiazide (HYDRODIURIL) 25 MG tablet Take 1 tablet (25 mg total) by mouth daily. 08/23/19   Charlesetta Shanks, MD  HYDROcodone-acetaminophen (NORCO/VICODIN) 5-325 MG per tablet Take 1-2 tablets by mouth every 6 hours as needed for pain and/or cough. 04/26/14   Pisciotta, Elmyra Ricks, PA-C  lisinopril (PRINIVIL,ZESTRIL) 40 MG tablet Take 40 mg by mouth daily.    [provider]  lisinopril (ZESTRIL) 20 MG tablet Take 1 tablet (20 mg total) by mouth daily. 08/23/19   Charlesetta Shanks, MD  lisinopril-hydrochlorothiazide (ZESTORETIC) 20-25 MG tablet Take 1 tablet by mouth daily. 01/01/19 01/31/19  Long, Wonda Olds, MD  losartan (COZAAR) 100 MG tablet Take  100 mg by mouth daily.    [provider]  methocarbamol (ROBAXIN) 750 MG tablet Take 1-2 tablets (750-1,500 mg total) by mouth 3 (three) times daily as needed for muscle spasms. 01/29/18   Lorin Glass, PA-C  metoprolol (LOPRESSOR) 25 MG tablet Take 1 tablet (25 mg total) by mouth 2 (two) times daily. 04/26/14   Pisciotta, Elmyra Ricks, PA-C  Prenatal Vit-Fe Fumarate-FA (MULTIVITAMIN-PRENATAL) 27-0.8 MG TABS Take 1 tablet by mouth daily.    [provider]  triamterene-hydrochlorothiazide (DYAZIDE) 37.5-25 MG capsule Take 1 capsule by mouth daily.    [provider]    Allergies    Penicillins  Review of Systems   Review of Systems 10 systems reviewed negative except as per HPI Physical Exam Updated Vital Signs BP (!) 223/112 (BP Location: Right Arm)    Pulse 58    Temp 98.1 F (36.7 C) (Oral)    Resp 16    Ht 5\' 5"  (1.651 m)    Wt 99.8 kg    SpO2 99%    BMI 36.61 kg/m    Physical Exam Constitutional:      Appearance: Normal appearance. She is obese.  HENT:     Head: Normocephalic and atraumatic.  Eyes:     Extraocular Movements: Extraocular movements intact.     Conjunctiva/sclera: Conjunctivae normal.  Cardiovascular:     Rate and Rhythm: Normal rate and regular rhythm.  Pulmonary:     Effort: Pulmonary effort is normal.     Breath sounds: Normal breath sounds.  Abdominal:     General: There is no distension.     Palpations: Abdomen is soft.     Tenderness: There is no abdominal tenderness.  Musculoskeletal:        General: No swelling. Normal range of motion.     Cervical back: Neck supple.     Right lower leg: No edema.     Left lower leg: No edema.  Skin:    General: Skin is warm and dry.     Findings: Rash present.     Comments: Patient has few, small plaque lesions on her hands and feet see attached images.  Neurological:     General: No focal deficit present.     Mental Status: She is alert and oriented to person, place, and time.     Coordination: Coordination normal.  Psychiatric:        Mood and Affect: Mood normal.             ED Results / Procedures / Treatments   Labs (all labs ordered are listed, but only abnormal results are displayed) Labs Reviewed - No data to display  EKG None  Radiology No results found.  Procedures Procedures (including critical care time)  Medications Ordered in ED Medications  amLODipine (NORVASC) tablet 10 mg (10 mg Oral Given 08/23/19 1146)  lisinopril (ZESTRIL) tablet 40 mg (40 mg Oral Given 08/23/19 1146)  cloNIDine (CATAPRES) tablet 0.1 mg (0.1 mg Oral Given 08/23/19 1146)    ED Course  I have reviewed the triage vital signs and the nursing notes.  Pertinent labs & imaging results that were available during my care of the patient were reviewed by me and considered in my medical decision making (see chart for details).    MDM Rules/Calculators/A&P                           Patient presents the chief complaint of a  rash for over 2 months.  Also requests refill on blood pressure medications.  At this time, unclear what the etiology of patient's rashes.  It is chronic and stable in appearance.  She reports is somewhat pruritic.  Many lesions are fairly flattened some with some scaling overlying hyperpigmented skin.  I believe she will need to see dermatologist for final answer on these lesions.  She has been applying hydrocortisone cream without any relief.  Lesions are stable and nonemergent in appearance.  Patient requests fill blood pressure medications.  Per intake blood pressure is noted to be significantly elevated.  She reports she has been out of medications for 2 weeks.  She denies any symptoms of endorgan damage.  Patient is counseled that there is very important to follow-up with family doctor for management of her blood pressure.  She is given 1 oral dose in the emergency department and given lisinopril 20 hydrochlorothiazide 25, amlodipine 10 mg. Final Clinical Impression(s) / ED Diagnoses Final diagnoses:  Rash  Uncontrolled hypertension    Rx / DC Orders ED Discharge Orders         Ordered    amLODipine (NORVASC) 10 MG tablet  Daily     Discontinue  Reprint     08/23/19 1205    lisinopril (ZESTRIL) 20 MG tablet  Daily     Discontinue  Reprint     08/23/19 1205    hydrochlorothiazide (HYDRODIURIL) 25 MG tablet  Daily     Discontinue  Reprint     08/23/19 1205           Charlesetta Shanks, MD 08/23/19 1209

## 2019-08-23 NOTE — Discharge Instructions (Signed)
1.  Is very important that you get established with a family doctor to monitor your blood pressure and prescribe your medications.  Uncontrolled blood pressure leads to strokes, kidney failure and heart attacks. 2.  Your rash has been present for several months and not responding to typical therapy.  Make an appointment with a dermatologist to be seen as soon as possible.  Discharge directions include a number to call to contact dermatology clinic. 3.  Return to the emergency department you have worsening or changing symptoms.

## 2019-08-23 NOTE — ED Triage Notes (Addendum)
Generalized itching rash x 2 months.  Pt states she was seen at [facility] and was told it was ringworm and was told to get cortisone cream.  Pt also needs refill on BP meds.

## 2020-01-27 ENCOUNTER — Encounter (HOSPITAL_BASED_OUTPATIENT_CLINIC_OR_DEPARTMENT_OTHER): Payer: Self-pay

## 2020-01-27 ENCOUNTER — Emergency Department (HOSPITAL_BASED_OUTPATIENT_CLINIC_OR_DEPARTMENT_OTHER)
Admission: EM | Admit: 2020-01-27 | Discharge: 2020-01-27 | Disposition: A | Discharge status: Home / Self Care | Payer: 59 | Attending: Emergency Medicine | Admitting: Emergency Medicine

## 2020-01-27 ENCOUNTER — Other Ambulatory Visit: Payer: Self-pay

## 2020-01-27 DIAGNOSIS — R519 Headache, unspecified: Secondary | ICD-10-CM | POA: Insufficient documentation

## 2020-01-27 DIAGNOSIS — I10 Essential (primary) hypertension: Secondary | ICD-10-CM | POA: Insufficient documentation

## 2020-01-27 DIAGNOSIS — U071 COVID-19: Secondary | ICD-10-CM | POA: Insufficient documentation

## 2020-01-27 DIAGNOSIS — Z5321 Procedure and treatment not carried out due to patient leaving prior to being seen by health care provider: Secondary | ICD-10-CM | POA: Diagnosis not present

## 2020-01-27 DIAGNOSIS — R1110 Vomiting, unspecified: Secondary | ICD-10-CM | POA: Diagnosis not present

## 2020-01-27 DIAGNOSIS — R509 Fever, unspecified: Secondary | ICD-10-CM | POA: Diagnosis present

## 2020-01-27 LAB — RESP PANEL BY RT-PCR (FLU A&B, COVID) ARPGX2
Influenza A by PCR: NEGATIVE
Influenza B by PCR: NEGATIVE
SARS Coronavirus 2 by RT PCR: POSITIVE — AB

## 2020-01-27 NOTE — ED Triage Notes (Signed)
Was @ [facility] ED & swabbed for covid but is unsure of results. Fever, chills, HA vomited 1x earlier to arrival. Reports having fever of 102. Hx HTN

## 2020-01-27 NOTE — ED Notes (Signed)
Lab called a positive covid. Dr. Tyrone Nine aware.

## 2020-01-27 NOTE — ED Notes (Addendum)
Attempt to call patient's covid results. Pt's phone number listed was not valid

## 2020-01-27 NOTE — ED Notes (Signed)
Pt A&Ox4, vitals WNL w exception of HTN. Pt had reported not taking scheduled HTN meds earlier today. Pt eloped before seeing provider.

## 2020-10-26 ENCOUNTER — Emergency Department (HOSPITAL_BASED_OUTPATIENT_CLINIC_OR_DEPARTMENT_OTHER)

## 2020-10-26 ENCOUNTER — Inpatient Hospital Stay (HOSPITAL_COMMUNITY)
Admission: EM | Disposition: A | Discharge status: Home / Self Care | Payer: Self-pay | Source: Home / Self Care | Attending: Interventional Cardiology

## 2020-10-26 ENCOUNTER — Other Ambulatory Visit: Payer: Self-pay | Admitting: Interventional Cardiology

## 2020-10-26 ENCOUNTER — Encounter: Payer: Self-pay | Admitting: Interventional Cardiology

## 2020-10-26 ENCOUNTER — Inpatient Hospital Stay (HOSPITAL_BASED_OUTPATIENT_CLINIC_OR_DEPARTMENT_OTHER)
Admission: EM | Admit: 2020-10-26 | Discharge: 2020-10-29 | DRG: 280 | Disposition: A | Discharge status: Home / Self Care | Attending: Interventional Cardiology | Admitting: Interventional Cardiology

## 2020-10-26 ENCOUNTER — Other Ambulatory Visit: Payer: Self-pay

## 2020-10-26 ENCOUNTER — Encounter (HOSPITAL_BASED_OUTPATIENT_CLINIC_OR_DEPARTMENT_OTHER): Payer: Self-pay | Admitting: Emergency Medicine

## 2020-10-26 ENCOUNTER — Telehealth (HOSPITAL_BASED_OUTPATIENT_CLINIC_OR_DEPARTMENT_OTHER): Payer: Self-pay | Admitting: Emergency Medicine

## 2020-10-26 DIAGNOSIS — F17210 Nicotine dependence, cigarettes, uncomplicated: Secondary | ICD-10-CM | POA: Diagnosis present

## 2020-10-26 DIAGNOSIS — I5033 Acute on chronic diastolic (congestive) heart failure: Secondary | ICD-10-CM

## 2020-10-26 DIAGNOSIS — I2119 ST elevation (STEMI) myocardial infarction involving other coronary artery of inferior wall: Principal | ICD-10-CM | POA: Diagnosis present

## 2020-10-26 DIAGNOSIS — I10 Essential (primary) hypertension: Secondary | ICD-10-CM | POA: Diagnosis not present

## 2020-10-26 DIAGNOSIS — Z20822 Contact with and (suspected) exposure to covid-19: Secondary | ICD-10-CM | POA: Diagnosis present

## 2020-10-26 DIAGNOSIS — Z885 Allergy status to narcotic agent status: Secondary | ICD-10-CM | POA: Diagnosis not present

## 2020-10-26 DIAGNOSIS — I248 Other forms of acute ischemic heart disease: Secondary | ICD-10-CM | POA: Diagnosis not present

## 2020-10-26 DIAGNOSIS — Z6841 Body Mass Index (BMI) 40.0 and over, adult: Secondary | ICD-10-CM

## 2020-10-26 DIAGNOSIS — I350 Nonrheumatic aortic (valve) stenosis: Secondary | ICD-10-CM | POA: Diagnosis not present

## 2020-10-26 DIAGNOSIS — I2111 ST elevation (STEMI) myocardial infarction involving right coronary artery: Secondary | ICD-10-CM

## 2020-10-26 DIAGNOSIS — E785 Hyperlipidemia, unspecified: Secondary | ICD-10-CM | POA: Diagnosis present

## 2020-10-26 DIAGNOSIS — I2510 Atherosclerotic heart disease of native coronary artery without angina pectoris: Secondary | ICD-10-CM | POA: Diagnosis present

## 2020-10-26 DIAGNOSIS — Z79899 Other long term (current) drug therapy: Secondary | ICD-10-CM | POA: Diagnosis not present

## 2020-10-26 DIAGNOSIS — Z8632 Personal history of gestational diabetes: Secondary | ICD-10-CM | POA: Diagnosis not present

## 2020-10-26 DIAGNOSIS — Z23 Encounter for immunization: Secondary | ICD-10-CM | POA: Diagnosis not present

## 2020-10-26 DIAGNOSIS — Z880 Allergy status to penicillin: Secondary | ICD-10-CM | POA: Diagnosis not present

## 2020-10-26 DIAGNOSIS — E876 Hypokalemia: Secondary | ICD-10-CM | POA: Diagnosis not present

## 2020-10-26 DIAGNOSIS — I110 Hypertensive heart disease with heart failure: Secondary | ICD-10-CM | POA: Diagnosis present

## 2020-10-26 DIAGNOSIS — R079 Chest pain, unspecified: Secondary | ICD-10-CM

## 2020-10-26 DIAGNOSIS — Z8249 Family history of ischemic heart disease and other diseases of the circulatory system: Secondary | ICD-10-CM | POA: Diagnosis not present

## 2020-10-26 DIAGNOSIS — E118 Type 2 diabetes mellitus with unspecified complications: Secondary | ICD-10-CM

## 2020-10-26 DIAGNOSIS — G4733 Obstructive sleep apnea (adult) (pediatric): Secondary | ICD-10-CM | POA: Diagnosis not present

## 2020-10-26 HISTORY — PX: CORONARY/GRAFT ACUTE MI REVASCULARIZATION: CATH118305

## 2020-10-26 HISTORY — PX: LEFT HEART CATH AND CORONARY ANGIOGRAPHY: CATH118249

## 2020-10-26 LAB — CBC WITH DIFFERENTIAL/PLATELET
Abs Immature Granulocytes: 0.02 10*3/uL (ref 0.00–0.07)
Abs Immature Granulocytes: 0.01 10*3/uL (ref 0.00–0.07)
Basophils Absolute: 0 10*3/uL (ref 0.0–0.1)
Basophils Absolute: 0 10*3/uL (ref 0.0–0.1)
Basophils Relative: 1 %
Basophils Relative: 1 %
Eosinophils Absolute: 0 10*3/uL (ref 0.0–0.5)
Eosinophils Absolute: 0 10*3/uL (ref 0.0–0.5)
Eosinophils Relative: 1 %
Eosinophils Relative: 1 %
HCT: 42.8 % (ref 36.0–46.0)
HCT: 42.6 % (ref 36.0–46.0)
Hemoglobin: 14.1 g/dL (ref 12.0–15.0)
Hemoglobin: 14.1 g/dL (ref 12.0–15.0)
Immature Granulocytes: 0 %
Immature Granulocytes: 0 %
Lymphocytes Relative: 34 %
Lymphocytes Relative: 45 %
Lymphs Abs: 2 10*3/uL (ref 0.7–4.0)
Lymphs Abs: 2.7 10*3/uL (ref 0.7–4.0)
MCH: 27 pg (ref 26.0–34.0)
MCH: 27.1 pg (ref 26.0–34.0)
MCHC: 32.9 g/dL (ref 30.0–36.0)
MCHC: 33.1 g/dL (ref 30.0–36.0)
MCV: 82 fL (ref 80.0–100.0)
MCV: 81.9 fL (ref 80.0–100.0)
Monocytes Absolute: 0.3 10*3/uL (ref 0.1–1.0)
Monocytes Absolute: 0.3 10*3/uL (ref 0.1–1.0)
Monocytes Relative: 6 %
Monocytes Relative: 6 %
Neutro Abs: 3.3 10*3/uL (ref 1.7–7.7)
Neutro Abs: 2.9 10*3/uL (ref 1.7–7.7)
Neutrophils Relative %: 58 %
Neutrophils Relative %: 47 %
Platelets: 263 10*3/uL (ref 150–400)
Platelets: 245 10*3/uL (ref 150–400)
RBC: 5.22 MIL/uL — ABNORMAL HIGH (ref 3.87–5.11)
RBC: 5.2 MIL/uL — ABNORMAL HIGH (ref 3.87–5.11)
RDW: 14.5 % (ref 11.5–15.5)
RDW: 14.5 % (ref 11.5–15.5)
WBC: 5.7 10*3/uL (ref 4.0–10.5)
WBC: 5.9 10*3/uL (ref 4.0–10.5)
nRBC: 0 % (ref 0.0–0.2)
nRBC: 0 % (ref 0.0–0.2)

## 2020-10-26 LAB — BASIC METABOLIC PANEL
Anion gap: 11 (ref 5–15)
BUN: 11 mg/dL (ref 6–20)
CO2: 21 mmol/L — ABNORMAL LOW (ref 22–32)
Calcium: 8.8 mg/dL — ABNORMAL LOW (ref 8.9–10.3)
Chloride: 105 mmol/L (ref 98–111)
Creatinine, Ser: 1.23 mg/dL — ABNORMAL HIGH (ref 0.44–1.00)
GFR, Estimated: 55 mL/min — ABNORMAL LOW (ref >60)
Glucose, Bld: 111 mg/dL — ABNORMAL HIGH (ref 70–99)
Potassium: 3.5 mmol/L (ref 3.5–5.1)
Sodium: 137 mmol/L (ref 135–145)

## 2020-10-26 LAB — COMPREHENSIVE METABOLIC PANEL
ALT: 15 U/L (ref 0–44)
AST: 15 U/L (ref 15–41)
Albumin: 3.7 g/dL (ref 3.5–5.0)
Alkaline Phosphatase: 88 U/L (ref 38–126)
Anion gap: 6 (ref 5–15)
BUN: 12 mg/dL (ref 6–20)
CO2: 28 mmol/L (ref 22–32)
Calcium: 9.1 mg/dL (ref 8.9–10.3)
Chloride: 103 mmol/L (ref 98–111)
Creatinine, Ser: 1.19 mg/dL — ABNORMAL HIGH (ref 0.44–1.00)
GFR, Estimated: 57 mL/min — ABNORMAL LOW (ref >60)
Glucose, Bld: 129 mg/dL — ABNORMAL HIGH (ref 70–99)
Potassium: 3.8 mmol/L (ref 3.5–5.1)
Sodium: 137 mmol/L (ref 135–145)
Total Bilirubin: 0.3 mg/dL (ref 0.3–1.2)
Total Protein: 7.7 g/dL (ref 6.5–8.1)

## 2020-10-26 LAB — RESP PANEL BY RT-PCR (FLU A&B, COVID) ARPGX2
Influenza A by PCR: NEGATIVE
Influenza B by PCR: NEGATIVE
SARS Coronavirus 2 by RT PCR: NEGATIVE

## 2020-10-26 LAB — LIPID PANEL
Cholesterol: 164 mg/dL (ref 0–200)
Cholesterol: 152 mg/dL (ref 0–200)
HDL: 37 mg/dL — ABNORMAL LOW (ref >40)
HDL: 32 mg/dL — ABNORMAL LOW (ref >40)
LDL Cholesterol: 111 mg/dL — ABNORMAL HIGH (ref 0–99)
LDL Cholesterol: 89 mg/dL (ref 0–99)
Total CHOL/HDL Ratio: 4.4 RATIO
Total CHOL/HDL Ratio: 4.8 RATIO
Triglycerides: 79 mg/dL (ref <150)
Triglycerides: 153 mg/dL — ABNORMAL HIGH (ref <150)
VLDL: 16 mg/dL (ref 0–40)
VLDL: 31 mg/dL (ref 0–40)

## 2020-10-26 LAB — HEMOGLOBIN A1C
Hgb A1c MFr Bld: 6.4 % — ABNORMAL HIGH (ref 4.8–5.6)
Mean Plasma Glucose: 136.98 mg/dL

## 2020-10-26 LAB — PROTIME-INR
INR: 1 (ref 0.8–1.2)
Prothrombin Time: 13 seconds (ref 11.4–15.2)

## 2020-10-26 LAB — TROPONIN I (HIGH SENSITIVITY)
Troponin I (High Sensitivity): 8 ng/L (ref <18)
Troponin I (High Sensitivity): 14 ng/L (ref <18)

## 2020-10-26 LAB — APTT: aPTT: 31 seconds (ref 24–36)

## 2020-10-26 LAB — MRSA NEXT GEN BY PCR, NASAL: MRSA by PCR Next Gen: NOT DETECTED

## 2020-10-26 SURGERY — CORONARY/GRAFT ACUTE MI REVASCULARIZATION
Anesthesia: LOCAL

## 2020-10-26 MED ORDER — CLOPIDOGREL BISULFATE 300 MG PO TABS
ORAL_TABLET | ORAL | Status: DC | PRN
  Administered 2020-10-26: 600 mg via ORAL

## 2020-10-26 MED ORDER — VERAPAMIL HCL 2.5 MG/ML IV SOLN
INTRAVENOUS | Status: AC
  Filled 2020-10-26: qty 2

## 2020-10-26 MED ORDER — AMLODIPINE BESYLATE 5 MG PO TABS
10.0000 mg | ORAL_TABLET | Freq: Every day | ORAL | Status: DC
  Administered 2020-10-26 – 2020-10-29 (×4): 10 mg via ORAL
  Filled 2020-10-26 (×4): qty 2

## 2020-10-26 MED ORDER — ATORVASTATIN CALCIUM 80 MG PO TABS
80.0000 mg | ORAL_TABLET | Freq: Every day | ORAL | Status: DC
  Administered 2020-10-26 – 2020-10-29 (×4): 80 mg via ORAL
  Filled 2020-10-26 (×4): qty 1

## 2020-10-26 MED ORDER — ONDANSETRON HCL 4 MG/2ML IJ SOLN: 4.0000 mg | Freq: Four times a day (QID) | INTRAMUSCULAR | Status: DC | PRN

## 2020-10-26 MED ORDER — CLOPIDOGREL BISULFATE 75 MG PO TABS
75.0000 mg | ORAL_TABLET | Freq: Every day | ORAL | Status: DC
  Administered 2020-10-27 – 2020-10-29 (×3): 75 mg via ORAL
  Filled 2020-10-26 (×3): qty 1

## 2020-10-26 MED ORDER — SODIUM CHLORIDE 0.9 % IV SOLN: 250.0000 mL | INTRAVENOUS | Status: DC | PRN

## 2020-10-26 MED ORDER — ASPIRIN 81 MG PO CHEW
324.0000 mg | CHEWABLE_TABLET | Freq: Once | ORAL | Status: AC
  Administered 2020-10-26: 324 mg via ORAL
  Filled 2020-10-26: qty 4

## 2020-10-26 MED ORDER — NITROGLYCERIN IN D5W 200-5 MCG/ML-% IV SOLN
INTRAVENOUS | Status: AC
  Filled 2020-10-26: qty 250

## 2020-10-26 MED ORDER — HEPARIN (PORCINE) IN NACL 1000-0.9 UT/500ML-% IV SOLN
INTRAVENOUS | Status: AC
  Filled 2020-10-26: qty 1000

## 2020-10-26 MED ORDER — SODIUM CHLORIDE 0.9 % IV SOLN: INTRAVENOUS | Status: DC

## 2020-10-26 MED ORDER — HYDROCHLOROTHIAZIDE 12.5 MG PO CAPS
12.5000 mg | ORAL_CAPSULE | Freq: Every day | ORAL | Status: DC
  Administered 2020-10-26 – 2020-10-29 (×4): 12.5 mg via ORAL
  Filled 2020-10-26 (×4): qty 1

## 2020-10-26 MED ORDER — LIDOCAINE HCL (PF) 1 % IJ SOLN
INTRAMUSCULAR | Status: AC
  Filled 2020-10-26: qty 30

## 2020-10-26 MED ORDER — SODIUM CHLORIDE 0.9% FLUSH: 3.0000 mL | INTRAVENOUS | Status: DC | PRN

## 2020-10-26 MED ORDER — LIDOCAINE HCL (PF) 1 % IJ SOLN
INTRAMUSCULAR | Status: DC | PRN
  Administered 2020-10-26: 2 mL

## 2020-10-26 MED ORDER — SODIUM CHLORIDE 0.9% FLUSH
3.0000 mL | Freq: Two times a day (BID) | INTRAVENOUS | Status: DC
  Administered 2020-10-26 – 2020-10-29 (×5): 3 mL via INTRAVENOUS

## 2020-10-26 MED ORDER — LABETALOL HCL 5 MG/ML IV SOLN
10.0000 mg | INTRAVENOUS | Status: AC | PRN
  Administered 2020-10-26: 10 mg via INTRAVENOUS
  Filled 2020-10-26: qty 4

## 2020-10-26 MED ORDER — LOSARTAN POTASSIUM 50 MG PO TABS
50.0000 mg | ORAL_TABLET | Freq: Every day | ORAL | Status: DC
  Administered 2020-10-26 – 2020-10-29 (×4): 50 mg via ORAL
  Filled 2020-10-26 (×4): qty 1

## 2020-10-26 MED ORDER — SODIUM CHLORIDE 0.9 % IV SOLN: INTRAVENOUS | Status: AC

## 2020-10-26 MED ORDER — NITROGLYCERIN 1 MG/10 ML FOR IR/CATH LAB
INTRA_ARTERIAL | Status: DC | PRN
  Administered 2020-10-26: 200 ug via INTRACORONARY

## 2020-10-26 MED ORDER — NITROGLYCERIN IN D5W 200-5 MCG/ML-% IV SOLN
40.0000 ug/min | INTRAVENOUS | Status: DC
  Administered 2020-10-26: 10 ug/min via INTRAVENOUS
  Administered 2020-10-26: 15 ug/min via INTRAVENOUS
  Administered 2020-10-26: 35 ug/min via INTRAVENOUS
  Filled 2020-10-26: qty 250

## 2020-10-26 MED ORDER — HEPARIN SODIUM (PORCINE) 5000 UNIT/ML IJ SOLN
4000.0000 [IU] | Freq: Once | INTRAMUSCULAR | Status: AC
  Administered 2020-10-26: 4000 [IU] via INTRAVENOUS
  Filled 2020-10-26: qty 1

## 2020-10-26 MED ORDER — HEPARIN SODIUM (PORCINE) 5000 UNIT/ML IJ SOLN: 60.0000 [IU]/kg | Freq: Once | INTRAMUSCULAR | Status: DC

## 2020-10-26 MED ORDER — NITROGLYCERIN 1 MG/10 ML FOR IR/CATH LAB
INTRA_ARTERIAL | Status: AC
  Filled 2020-10-26: qty 10

## 2020-10-26 MED ORDER — HEPARIN (PORCINE) 25000 UT/250ML-% IV SOLN: 1200.0000 [IU]/h | INTRAVENOUS | Status: DC

## 2020-10-26 MED ORDER — HYDRALAZINE HCL 20 MG/ML IJ SOLN
10.0000 mg | INTRAMUSCULAR | Status: AC | PRN
  Administered 2020-10-26: 10 mg via INTRAVENOUS
  Filled 2020-10-26 (×2): qty 1

## 2020-10-26 MED ORDER — ASPIRIN 81 MG PO CHEW
81.0000 mg | CHEWABLE_TABLET | Freq: Every day | ORAL | Status: DC
  Administered 2020-10-27 – 2020-10-29 (×3): 81 mg via ORAL
  Filled 2020-10-26 (×4): qty 1

## 2020-10-26 MED ORDER — ACETAMINOPHEN 325 MG PO TABS
650.0000 mg | ORAL_TABLET | ORAL | Status: DC | PRN
  Administered 2020-10-27 (×3): 650 mg via ORAL
  Filled 2020-10-26 (×3): qty 2

## 2020-10-26 MED ORDER — OXYCODONE HCL 5 MG PO TABS: 5.0000 mg | ORAL_TABLET | ORAL | Status: DC | PRN

## 2020-10-26 MED ORDER — NITROGLYCERIN 0.4 MG SL SUBL
0.4000 mg | SUBLINGUAL_TABLET | SUBLINGUAL | Status: DC | PRN
  Filled 2020-10-26: qty 1

## 2020-10-26 MED ORDER — METOPROLOL SUCCINATE ER 25 MG PO TB24
12.5000 mg | ORAL_TABLET | Freq: Every day | ORAL | Status: DC
  Administered 2020-10-26: 12.5 mg via ORAL
  Filled 2020-10-26: qty 1

## 2020-10-26 MED ORDER — IOHEXOL 350 MG/ML SOLN
INTRAVENOUS | Status: DC | PRN
  Administered 2020-10-26: 110 mL via INTRA_ARTERIAL

## 2020-10-26 MED ORDER — NITROGLYCERIN IN D5W 200-5 MCG/ML-% IV SOLN
INTRAVENOUS | Status: AC | PRN
  Administered 2020-10-26: 10 ug/min via INTRAVENOUS

## 2020-10-26 MED ORDER — HEPARIN SODIUM (PORCINE) 1000 UNIT/ML IJ SOLN
INTRAMUSCULAR | Status: DC | PRN
  Administered 2020-10-26: 5000 [IU] via INTRAVENOUS

## 2020-10-26 MED ORDER — HEPARIN (PORCINE) 25000 UT/250ML-% IV SOLN
1500.0000 [IU]/h | INTRAVENOUS | Status: AC
  Administered 2020-10-26: 1200 [IU]/h via INTRAVENOUS
  Administered 2020-10-27: 1500 [IU]/h via INTRAVENOUS
  Filled 2020-10-26 (×2): qty 250

## 2020-10-26 MED ORDER — VERAPAMIL HCL 2.5 MG/ML IV SOLN
INTRAVENOUS | Status: DC | PRN
  Administered 2020-10-26: 10 mL via INTRA_ARTERIAL

## 2020-10-26 MED ORDER — CLOPIDOGREL BISULFATE 300 MG PO TABS
ORAL_TABLET | ORAL | Status: AC
  Filled 2020-10-26: qty 2

## 2020-10-26 MED ORDER — HEPARIN SODIUM (PORCINE) 1000 UNIT/ML IJ SOLN
INTRAMUSCULAR | Status: AC
  Filled 2020-10-26: qty 1

## 2020-10-26 SURGICAL SUPPLY — 12 items
CATH 5FR JL3.5 JR4 ANG PIG MP (CATHETERS) ×2 IMPLANT
CATH VISTA GUIDE 6FR JR4 (CATHETERS) ×2 IMPLANT
DEVICE RAD COMP TR BAND LRG (VASCULAR PRODUCTS) ×2 IMPLANT
GLIDESHEATH SLEND A-KIT 6F 22G (SHEATH) ×2 IMPLANT
GUIDEWIRE INQWIRE 1.5J.035X260 (WIRE) ×1 IMPLANT
INQWIRE 1.5J .035X260CM (WIRE) ×2
KIT ENCORE 26 ADVANTAGE (KITS) ×2 IMPLANT
KIT HEART LEFT (KITS) ×2 IMPLANT
PACK CARDIAC CATHETERIZATION (CUSTOM PROCEDURE TRAY) ×2 IMPLANT
SHEATH PROBE COVER 6X72 (BAG) ×4 IMPLANT
TRANSDUCER W/STOPCOCK (MISCELLANEOUS) ×2 IMPLANT
TUBING CIL FLEX 10 FLL-RA (TUBING) ×2 IMPLANT

## 2020-10-26 NOTE — H&P (Signed)
Cardiology Admission History and Physical:    CRITICAL [facility] TIME: 45 minutes   Patient ID: [facility] MRN: [id number]; DOB: 10-14-1975   Admission date: (Not on file)  PCP:  Alwyn Ren, MD   [facility] Cardiologist: Illene Labrador, III, MD      Chief Complaint:   Inferior STEMI  Patient Profile:   Jodi [facility] is a 45 y.o. female with a several year h/o chest pain intermittently who is being seen 10/26/2020 for the evaluation of inferior STEMI.Marland Kitchen  History of Present Illness:   [facility] had the following complaints when she arrived at [facility]:  "intermittent chest pain for the last month. Called PCP yesterday who sent her to a different ED last night. Was not seen by this morning, so came here for evaluation. Pt reports CP radiates to back, neck and L arm (tingles with CP). Denies other symptoms. Pt reports when pain starts, drinking water helps. Last dose of BP medications was this am."    Initial ECG was normal at 9:02 AM with the exception of LVH. Subsequent ECG during more severe pain at 9:44 AM revealed 2 mm ST elevation II, II, and AVF.  The monitor that was placed in the emergency department also demonstrated ST elevation during the episode of chest pain.  After speaking with the emergency [facility] physician, Dr. Billy Fischer, STEMI was activated.   She arrived by ambulance from [facility], having 1 severe episode of chest discomfort relieved by sublingual nitroglycerin in route.  ST segment elevation was completely resolved upon arrival.  She endorsed 1/10 chest discomfort.  Over the past month she has had episodes lasting 5 to 10 minutes and then resolving.  Occasionally the discomfort was brought on by physical activity/occurred during physical activity but many more occurred at rest.  The patient specifically states that she has had multiple episodes of recurring chest discomfort lasting less than 10 minutes over  the past month.  Such was the occasion today when she initially went to an [facility] and was placed in the waiting [facility] and after a couple hours, nothing was done.  She left and came to the [facility].    Risk factors include obesity, gestational DM,  primary hypertension, tobacco use, and hyperlipidemia.  Her mother has a history of congestive heart failure.  She has 3 children.  Gestational diabetes accompanied 2 of her pregnancies.   Past Medical History:  Diagnosis Date   Gestational diabetes    Gestational diabetes    Hypertension     Past Surgical History:  Procedure Laterality Date   ECTOPIC PREGNANCY SURGERY       Medications Prior to Admission: Prior to Admission medications   Medication Sig Start Date End Date Taking? Authorizing Provider  amLODipine (NORVASC) 10 MG tablet Take 1 tablet (10 mg total) by mouth daily. 01/01/19 01/31/19  Long, Wonda Olds, MD  amLODipine (NORVASC) 10 MG tablet Take 1 tablet (10 mg total) by mouth daily. 08/23/19   Charlesetta Shanks, MD  aspirin 325 MG tablet Take 325 mg by mouth daily.    [provider]  hydrochlorothiazide (HYDRODIURIL) 25 MG tablet Take 1 tablet (25 mg total) by mouth daily. 08/23/19   Charlesetta Shanks, MD  HYDROcodone-acetaminophen (NORCO/VICODIN) 5-325 MG per tablet Take 1-2 tablets by mouth every 6 hours as needed for pain and/or cough. 04/26/14   Pisciotta, Elmyra Ricks, PA-C  lisinopril (PRINIVIL,ZESTRIL) 40 MG  tablet Take 40 mg by mouth daily.    [provider]  lisinopril (ZESTRIL) 20 MG tablet Take 1 tablet (20 mg total) by mouth daily. 08/23/19   Charlesetta Shanks, MD  lisinopril-hydrochlorothiazide (ZESTORETIC) 20-25 MG tablet Take 1 tablet by mouth daily. 01/01/19 01/31/19  Long, Wonda Olds, MD  losartan (COZAAR) 100 MG tablet Take 100 mg by mouth daily.    [provider]  methocarbamol (ROBAXIN) 750 MG tablet Take 1-2 tablets (750-1,500 mg total) by mouth 3 (three) times  daily as needed for muscle spasms. 01/29/18   Lorin Glass, PA-C  metoprolol (LOPRESSOR) 25 MG tablet Take 1 tablet (25 mg total) by mouth 2 (two) times daily. 04/26/14   Pisciotta, Elmyra Ricks, PA-C  Prenatal Vit-Fe Fumarate-FA (MULTIVITAMIN-PRENATAL) 27-0.8 MG TABS Take 1 tablet by mouth daily.    [provider]  triamterene-hydrochlorothiazide (DYAZIDE) 37.5-25 MG capsule Take 1 capsule by mouth daily.    [provider]     Allergies:    Allergies  Allergen Reactions   Penicillins Rash   Tramadol     Shake and get nauseated    Social History:   Social History   Socioeconomic History   Marital status: Single    Spouse name: Not on file   Number of children: Not on file   Years of education: Not on file   Highest education level: Not on file  Occupational History   Not on file  Tobacco Use   Smoking status: Every Day    Packs/day: 0.50    Types: Cigarettes   Smokeless tobacco: Never  Substance and Sexual Activity   Alcohol use: No   Drug use: No   Sexual activity: Yes    Birth control/protection: I.U.D.  Other Topics Concern   Not on file  Social History Narrative   Not on file   Social Determinants of Health   Financial Resource Strain: Not on file  Food Insecurity: Not on file  Transportation Needs: Not on file  Physical Activity: Not on file  Stress: Not on file  Social Connections: Not on file  Intimate Partner Violence: Not on file    Family History: Mother with history of heart failure The patient's family history is not on file.    ROS:  Please see the history of present illness.  Endorses compliance with medications for blood pressure but no therapy for hyperlipidemia.  All other ROS reviewed and negative.     Physical Exam/Data:  There were no vitals filed for this visit. @IOBRIEF @ Last 3 Weights 08/23/2019 01/01/2019 06/13/2018  Weight (lbs) 220 lb 225 lb 225 lb 9.6 oz  Weight (kg) 99.791 kg 102.059 kg 102.331 kg     There  is no height or weight on file to calculate BMI.  General:  Well nourished, well developed, in no acute distress.  Lying flat on the gurney after being dismounted from the ambulance.  She was screened for COVID-19 in the [facility]. HEENT: normal Neck: no adenopathy, bruits, or JVD Vascular: No carotid bruits; Distal pulses 2+ bilaterally   Cardiac:  normal S1, S2; RRR; no murmur.  An S4 gallop was audible. Lungs:  clear to auscultation bilaterally, no wheezing, rhonchi or rales  Abd: soft, nontender, no hepatomegaly  Ext: no edema Musculoskeletal:  No deformities, BUE and BLE strength normal and equal Skin: Lichenification of skin on the arms suggests a dermatologic problem. Neuro:  CNs 2-12 intact, no focal abnormalities noted Psych:  Normal affect  EKG:  The ECG's that was done 9:02 AM and 9:44 AM were personally reviewed and demonstrates 2 mm acute ST segment elevation in II, III, and aVF on the latter tracing compared with the initial tracing.  Relevant CV Studies: None  Laboratory Data:  High Sensitivity Troponin:  No results for input(s): TROPONINIHS in the last 720 hours.    ChemistryNo results for input(s): NA, K, CL, CO2, GLUCOSE, BUN, CREATININE, CALCIUM, MG, GFRNONAA, GFRAA, ANIONGAP in the last 168 hours.  No results for input(s): PROT, ALBUMIN, AST, ALT, ALKPHOS, BILITOT in the last 168 hours. Lipids No results for input(s): CHOL, TRIG, HDL, LABVLDL, LDLCALC, CHOLHDL in the last 168 hours. Hematology Recent Labs  Lab 10/26/20 0952  WBC 5.7  RBC 5.22*  HGB 14.1  HCT 42.8  MCV 82.0  MCH 27.0  MCHC 32.9  RDW 14.5  PLT 263   Thyroid No results for input(s): TSH, FREET4 in the last 168 hours. BNPNo results for input(s): BNP, PROBNP in the last 168 hours.  DDimer No results for input(s): DDIMER in the last 168 hours.   Radiology/Studies:  No results found.   Assessment and Plan:   Acute inferior ST elevation myocardial infarction:  Management strategy is direct mechanical revascularization if indicated.  The nature of the procedure, route, risks (including stroke, death, myocardial infarction, kidney injury, allergy, and bleeding), and potential need for stenting were discussed in detail and accepted by the patient under emergency circumstances. Accelerated hypertension: Acute endorgan injury to the heart.  Managed with beta-blocker, dihydropyridine calcium channel blocker, ARB, and diuretic therapy. Hyperlipidemia: She is a very high risk subset and quite young age.  Target LDL should be less than 55 even if PCSK9 therapy is required. Gestational diabetes: We will check hemoglobin A1c to rule out current unrecognized DM II.   Risk Assessment/Risk Scores:    TIMI Risk Score for ST  Elevation MI:   The patient's TIMI risk score is 2, which indicates a 2.2% risk of all cause mortality at 30 days.        Severity of Illness: The appropriate patient status for this patient is INPATIENT. Inpatient status is judged to be reasonable and necessary in order to provide the required intensity of service to ensure the patient's safety. The patient's presenting symptoms, physical exam findings, and initial radiographic and laboratory data in the context of their chronic comorbidities is felt to place them at high risk for further clinical deterioration. Furthermore, it is not anticipated that the patient will be medically stable for discharge from the hospital within 2 midnights of admission. The following factors support the patient status of inpatient.   " The patient's presenting symptoms include chest pain. " The worrisome physical exam findings include ST segment elevation. " The initial radiographic and laboratory data are worrisome because of none. " The chronic co-morbidities include diabetes/hypertension/hyperlipidemia..   * I certify that at the point of admission it is my clinical judgment that the patient will require  inpatient hospital [facility] spanning beyond 2 midnights from the point of admission due to high intensity of service, high risk for further deterioration and high frequency of surveillance required.*   For questions or updates, please contact [place] Please consult [email or web address] for contact info under     Signed, Sinclair Grooms, MD  10/26/2020 10:03 AM

## 2020-10-26 NOTE — ED Notes (Signed)
Called and updated husband Alonna Buckler per patient request [phone]. Informed that patient transferring to [facility] Cath lab

## 2020-10-26 NOTE — Progress Notes (Signed)
ANTICOAGULATION CONSULT NOTE - Consult  Pharmacy Consult for Heparin Indication:  ACS/STEMI  Allergies  Allergen Reactions   Penicillins Rash   Tramadol     Shake and get nauseated    Patient Measurements: Height: 5\' 2"  (157.5 cm) Weight: 102.1 kg (225 lb) IBW/kg (Calculated) : 50.1 Heparin Dosing Weight: 100 kg (estimated)  Vital Signs: Temp: 98.6 F (37 C) (09/25 1544) Temp Source: Oral (09/25 1544) BP: 153/73 (09/25 1515) Pulse Rate: 65 (09/25 1525)  Labs: Recent Labs    10/26/20 0952  HGB 14.1  HCT 42.8  PLT 263  APTT 31  LABPROT 13.0  INR 1.0  CREATININE 1.19*  TROPONINIHS 8     Estimated Creatinine Clearance: 66.8 mL/min (A) (by C-G formula based on SCr of 1.19 mg/dL (H)).   Medical History: Past Medical History:  Diagnosis Date   Gestational diabetes    Gestational diabetes    Hypertension     Medications:  Medications Prior to Admission  Medication Sig Dispense Refill Last Dose   amLODipine (NORVASC) 10 MG tablet Take 1 tablet (10 mg total) by mouth daily. 30 tablet 0 10/26/2020   cloNIDine (CATAPRES) 0.1 MG tablet Take 0.1 mg by mouth 2 (two) times daily.   10/26/2020   amLODipine (NORVASC) 10 MG tablet Take 1 tablet (10 mg total) by mouth daily. 30 tablet 0    hydrochlorothiazide (HYDRODIURIL) 25 MG tablet Take 1 tablet (25 mg total) by mouth daily. (Patient not taking: No sig reported) 30 tablet 0 Not Taking   HYDROcodone-acetaminophen (NORCO/VICODIN) 5-325 MG per tablet Take 1-2 tablets by mouth every 6 hours as needed for pain and/or cough. (Patient not taking: No sig reported) 7 tablet 0 Not Taking   lisinopril (ZESTRIL) 20 MG tablet Take 1 tablet (20 mg total) by mouth daily. (Patient not taking: Reported on 10/26/2020) 30 tablet 0 Not Taking   lisinopril-hydrochlorothiazide (ZESTORETIC) 20-25 MG tablet Take 1 tablet by mouth daily. 30 tablet 0    methocarbamol (ROBAXIN) 750 MG tablet Take 1-2 tablets (750-1,500 mg total) by mouth 3 (three)  times daily as needed for muscle spasms. (Patient not taking: Reported on 10/26/2020) 18 tablet 0 Not Taking   metoprolol (LOPRESSOR) 25 MG tablet Take 1 tablet (25 mg total) by mouth 2 (two) times daily. (Patient not taking: Reported on 10/26/2020) 60 tablet 0 Not Taking    Scheduled:   amLODipine  10 mg Oral Daily   [START ON 10/27/2020] aspirin  81 mg Oral Daily   atorvastatin  80 mg Oral Daily   [START ON 10/27/2020] clopidogrel  75 mg Oral Q breakfast   hydrochlorothiazide  12.5 mg Oral Daily   losartan  50 mg Oral Daily   metoprolol succinate  12.5 mg Oral Daily   sodium chloride flush  3 mL Intravenous Q12H   Infusions:   sodium chloride 125 mL/hr at 10/26/20 1400   sodium chloride     heparin     nitroGLYCERIN 15 mcg/min (10/26/20 1400)   PRN:   Assessment: 86 yof with a history of HTN and intermittent chest pain. Patient is presenting with Inferior STEMI. Heparin per pharmacy consult placed for  ACS/STEMI . Patient is on not on anticoagulation prior to arrival. 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 CPP, BCPS Clinical Pharmacist [phone] 10/26/2020 3:57 PM

## 2020-10-26 NOTE — Progress Notes (Signed)
ANTICOAGULATION CONSULT NOTE - Initial Consult  Pharmacy Consult for Heparin Indication:  ACS/STEMI  Allergies  Allergen Reactions   Penicillins Rash   Tramadol     Shake and get nauseated    Patient Measurements:   Heparin Dosing Weight: 100 kg (estimated)  Vital Signs: Temp: 98.7 F (37.1 C) (09/25 0902) Temp Source: Oral (09/25 0902) BP: 193/97 (09/25 0954) Pulse Rate: 115 (09/25 0954)  Labs: Recent Labs    10/26/20 0952  HGB 14.1  HCT 42.8  PLT 263  APTT 31  LABPROT 13.0  INR 1.0    CrCl cannot be calculated (Patient's most recent lab result is older than the maximum 21 days allowed.).   Medical History: Past Medical History:  Diagnosis Date   Gestational diabetes    Gestational diabetes    Hypertension     Medications:  (Not in a hospital admission)  Scheduled:  Infusions:   sodium chloride 20 mL/hr at 10/26/20 1014   PRN:   Assessment: 43 yof with a history of HTN and intermittent chest pain. Patient is presenting with Inferior STEMI. Heparin per pharmacy consult placed for  ACS/STEMI .  Patient is on not on anticoagulation prior to arrival.  Hgb14.1;plt 263  Goal of Therapy:  Heparin level 0.3-0.7 units/ml Monitor platelets by anticoagulation protocol: Yes   Plan:  4000 units IV bolus x 1 already given Start heparin infusion at 1200 units/hr Check anti-Xa level in 6 hours and daily while on heparin Continue to monitor H&H and platelets  Lorelei Pont, PharmD, BCPS 10/26/2020 10:15 AM ED Clinical Pharmacist -  [phone]

## 2020-10-26 NOTE — Plan of Care (Signed)
  Problem: Education: Goal: Understanding of CV disease, CV risk reduction, and recovery process will improve Outcome: Not Progressing   Problem: Activity: Goal: Ability to return to baseline activity level will improve Outcome: Progressing   Problem: Cardiovascular: Goal: Vascular access site(s) Level 0-1 will be maintained Outcome: Progressing

## 2020-10-26 NOTE — ED Notes (Signed)
[facility] at bedside 

## 2020-10-26 NOTE — CV Procedure (Signed)
50 to 60% mid RCA stenosis.  Possible ruptured plaque.  50% distal RCA, possible ruptured plaque.  Second left ventricular branch with pruning of the very distal segment raising question of SCAD. Normal LV function, EF 65%.  LVEDP 22 mmHg consistent with acute on chronic diastolic heart failure. Normal left main LAD with diffuse moderate narrowing throughout the mid segment. Circumflex with mild luminal narrowing and 40 to 50% stenosis in the ostium and in the mid to distal vessel.  Suspect ruptured plaque in the mid or distal RCA with transient thrombosis and reperfusion versus coronary artery spasm.  Plan: IV nitroglycerin.  Dual antiplatelet therapy.  Aggressive blood pressure control.  Risk factor modification with LDL less than 55, smoking cessation, and target blood pressure 130/80 mmHg.

## 2020-10-26 NOTE — Progress Notes (Signed)
.  .  .        xx

## 2020-10-26 NOTE — ED Notes (Signed)
Pt called out c/o CP, repeat EKG performed, pt ekg showed stemi. [facility] in route

## 2020-10-26 NOTE — ED Notes (Signed)
[facility] arrived, preparing patient for transport.

## 2020-10-26 NOTE — ED Notes (Signed)
Performed 2 12 Lead EKGs per EDP verbal order

## 2020-10-26 NOTE — ED Triage Notes (Addendum)
Pt reports intermittent chest pain for the last month. Called PCP yesterday who sent her to a different ED last night. Was not seen by this morning, so came here for evaluation. Pt reports CP radiates to back, neck and L arm (tingles with CP). Denies other symptoms. Pt reports when pain starts, drinking water helps. Last dose of BP medications was this am.

## 2020-10-26 NOTE — ED Provider Notes (Signed)
[facility] EMERGENCY DEPARTMENT Provider Note   CSN: [id number] Arrival date & time: 10/26/20  0848     History Chief Complaint  Patient presents with   Chest Pain    Jodi Porter is a 45 y.o. female.  HPI     45 year old female with a history of gestational diabetes, hypertension, hyperlipidemia, smoking,  presents with concern for chest pain.  Reports that she has been having intermittent chest pain over the last month, and it has worsened over the last week.  Reports it has been coming and going more frequently over the last week.  She talked to her primary care doctor who recommended she go to the emergency department for evaluation.  [facility] had labs completed in triage, and eloped prior to evaluation being complete.  Reports she had worsening of her symptoms earlier this morning, prompting her to come to the emergency department, however on arrival she reported her pain was about 2 out of 10.  Denies the pain being brought on by exertion.  Describes it as stabbing pressure like pain, radiating to the neck and the left arm with left arm tingling. Nothing seems to make it better or worse. Has associated dyspnea, nausea.  Family hx of CHF.  Pain 10/10 at time of my evaluation, increased from 2/10 on arrival to the ED.  Past Medical History:  Diagnosis Date   Gestational diabetes    Gestational diabetes    Hypertension     Patient Active Problem List   Diagnosis Date Noted   Acute ST elevation myocardial infarction (STEMI) of inferior wall ([place]) 10/26/2020   Acute ST elevation myocardial infarction (STEMI) involving other coronary artery of inferior wall ([place]) 10/26/2020   Chest pain 06/13/2018   Essential hypertension 06/13/2018   Morbid obesity ([place]) 06/13/2018   Hyperlipidemia, acquired 06/13/2018    Past Surgical History:  Procedure Laterality Date   ECTOPIC PREGNANCY SURGERY       OB History     Gravida   2   Para      Term      Preterm      AB      Living         SAB      IAB      Ectopic      Multiple      Live Births              History reviewed. No pertinent family history.  Social History   Tobacco Use   Smoking status: Every Day    Packs/day: 0.50    Types: Cigarettes   Smokeless tobacco: Never  Substance Use Topics   Alcohol use: No   Drug use: No    Home Medications Prior to Admission medications   Medication Sig Start Date End Date Taking? Authorizing Provider  amLODipine (NORVASC) 10 MG tablet Take 1 tablet (10 mg total) by mouth daily. 08/23/19  Yes Charlesetta Shanks, MD  cloNIDine (CATAPRES) 0.1 MG tablet Take 0.1 mg by mouth 2 (two) times daily. 06/11/20  Yes [provider]  amLODipine (NORVASC) 10 MG tablet Take 1 tablet (10 mg total) by mouth daily. 01/01/19 01/31/19  Long, Wonda Olds, MD  hydrochlorothiazide (HYDRODIURIL) 25 MG tablet Take 1 tablet (25 mg total) by mouth daily. Patient not taking: No sig reported 08/23/19   Charlesetta Shanks, MD  HYDROcodone-acetaminophen (NORCO/VICODIN) 5-325 MG per tablet Take 1-2 tablets by mouth every 6  hours as needed for pain and/or cough. Patient not taking: No sig reported 04/26/14   Pisciotta, Elmyra Ricks, PA-C  lisinopril (ZESTRIL) 20 MG tablet Take 1 tablet (20 mg total) by mouth daily. Patient not taking: Reported on 10/26/2020 08/23/19   Charlesetta Shanks, MD  lisinopril-hydrochlorothiazide (ZESTORETIC) 20-25 MG tablet Take 1 tablet by mouth daily. 01/01/19 01/31/19  Long, Wonda Olds, MD  methocarbamol (ROBAXIN) 750 MG tablet Take 1-2 tablets (750-1,500 mg total) by mouth 3 (three) times daily as needed for muscle spasms. Patient not taking: Reported on 10/26/2020 01/29/18   Lorin Glass, PA-C  metoprolol (LOPRESSOR) 25 MG tablet Take 1 tablet (25 mg total) by mouth 2 (two) times daily. Patient not taking: Reported on 10/26/2020 04/26/14   Pisciotta, Elmyra Ricks, PA-C    Allergies    Penicillins and  Tramadol  Review of Systems   Review of Systems  Unable to perform ROS: Acuity of condition  Constitutional:  Negative for fever.  Respiratory:  Positive for shortness of breath. Negative for cough.   Cardiovascular:  Positive for chest pain.  Gastrointestinal:  Negative for abdominal pain, nausea and vomiting.  Musculoskeletal:  Positive for arthralgias.   Physical Exam Updated Vital Signs BP (!) 143/79 (BP Location: Left Arm)   Pulse 67   Temp 98.7 F (37.1 C) (Oral)   Resp 18   Ht 5\' 2"  (1.575 m)   Wt 102.1 kg   SpO2 97%   BMI 41.15 kg/m   Physical Exam Vitals and nursing note reviewed.  Constitutional:      General: She is not in acute distress.    Appearance: She is well-developed. She is not diaphoretic.  HENT:     Head: Normocephalic and atraumatic.  Eyes:     Conjunctiva/sclera: Conjunctivae normal.  Cardiovascular:     Rate and Rhythm: Normal rate and regular rhythm.     Heart sounds: Normal heart sounds. No murmur heard.   No friction rub. No gallop.  Pulmonary:     Effort: Pulmonary effort is normal. No respiratory distress.     Breath sounds: Normal breath sounds. No wheezing or rales.  Abdominal:     General: There is no distension.     Palpations: Abdomen is soft.     Tenderness: There is no abdominal tenderness. There is no guarding.  Musculoskeletal:        General: No tenderness.     Cervical back: Normal range of motion.  Skin:    General: Skin is warm and dry.     Findings: No erythema or rash.  Neurological:     Mental Status: She is alert and oriented to person, place, and time.    ED Results / Procedures / Treatments   Labs (all labs ordered are listed, but only abnormal results are displayed) Labs Reviewed  CBC WITH DIFFERENTIAL/PLATELET - Abnormal; Notable for the following components:      Result Value   RBC 5.22 (*)    All other components within normal limits  COMPREHENSIVE METABOLIC PANEL - Abnormal; Notable for the following  components:   Glucose, Bld 129 (*)    Creatinine, Ser 1.19 (*)    GFR, Estimated 57 (*)    All other components within normal limits  HEMOGLOBIN A1C - Abnormal; Notable for the following components:   Hgb A1c MFr Bld 6.4 (*)    All other components within normal limits  LIPID PANEL - Abnormal; Notable for the following components:   HDL 37 (*)  LDL Cholesterol 111 (*)    All other components within normal limits  LIPID PANEL - Abnormal; Notable for the following components:   Triglycerides 153 (*)    HDL 32 (*)    All other components within normal limits  CBC WITH DIFFERENTIAL/PLATELET - Abnormal; Notable for the following components:   RBC 5.20 (*)    All other components within normal limits  RESP PANEL BY RT-PCR (FLU A&B, COVID) ARPGX2  MRSA NEXT GEN BY PCR, NASAL  PROTIME-INR  APTT  LIPOPROTEIN A (LPA)  HIGH SENSITIVITY CRP  BASIC METABOLIC PANEL  CBC  HEPARIN LEVEL (UNFRACTIONATED)  BASIC METABOLIC PANEL  TROPONIN I (HIGH SENSITIVITY)    EKG EKG Interpretation  Date/Time:  Sunday October 26 2020 09:44:08 EDT Ventricular Rate:  66 PR Interval:  153 QRS Duration: 109 QT Interval:  410 QTC Calculation: 430 R Axis:   24 Text Interpretation: Sinus rhythm Inferior infarct, acute (RCA) Borderline ST elevation, anterior leads Lateral leads are also involved Probable RV involvement, suggest recording right precordial leads >>> Acute MI <<< Since prior ECG today, now has inferior STE Confirmed by Gareth Morgan [phone]) on 10/26/2020 9:18:17 PM  Radiology CARDIAC CATHETERIZATION  Result Date: 10/26/2020 CONCLUSIONS Mid and distal eccentric 50 to 60% RCA stenoses.  Diffuse disease and or scad in the second left ventricular branch of the right coronary.  Suspect transient thrombotic occlusion of the mid or distal RCA lesion.  Remote possibility is coronary artery spasm. Widely patent LAD with diffuse nonobstructive atherosclerosis. Widely patent circumflex with mild  proximal and mid plaque. Widely patent left main Normal left ventricular systolic function with EF 65%.  Severe elevation in LVEDP 25 mmHg, compatible with acute on chronic diastolic heart failure. RECOMMENDATIONS: Acute coronary syndrome with demonstrated inferior ST elevation related to transient thrombosis with reperfusion or coronary spasm in a patient with risk factors for either process. Loaded with dual antiplatelet therapy (aspirin and Plavix); IV nitroglycerin for least 24 hours; resume IV heparin when appropriate after hemostasis; aggressive risk factor modification with high intensity statin therapy (target LDL less than 55 given high risk and young age) and multi prolonged antihypertensive regimen (to include calcium channel blocker therapy/beta-blocker therapy/diuretic therapy/ARB). Check hemoglobin A1c, LP(a), hs-CRP.   DG Chest Port 1 View  Result Date: 10/26/2020 CLINICAL DATA:  cp chest pain EXAM: PORTABLE CHEST 1 VIEW COMPARISON:  October 21, 2020, March twelfth 2020 FINDINGS: The cardiomediastinal silhouette is unchanged in contour. No pleural effusion. No pneumothorax. No acute pleuroparenchymal abnormality. Visualized abdomen is unremarkable. Lucency of the RIGHT humeral head, likely degenerative in etiology. IMPRESSION: No acute cardiopulmonary abnormality. Electronically Signed   By: Valentino Saxon M.D.   On: 10/26/2020 10:09    Procedures .Critical Care Performed by: Gareth Morgan, MD Authorized by: Gareth Morgan, MD   Critical care provider statement:    Critical care time (minutes):  45   Critical care was time spent personally by me on the following activities:  Discussions with consultants, examination of patient, ordering and performing treatments and interventions, ordering and review of laboratory studies, ordering and review of radiographic studies, pulse oximetry, re-evaluation of patient's condition, obtaining history from patient or surrogate and review of  old charts   Medications Ordered in ED Medications  labetalol (NORMODYNE) injection 10 mg (10 mg Intravenous Given 10/26/20 1348)  hydrALAZINE (APRESOLINE) injection 10 mg (10 mg Intravenous Given 10/26/20 1331)  acetaminophen (TYLENOL) tablet 650 mg (has no administration in time range)  ondansetron (ZOFRAN) injection 4 mg (  has no administration in time range)  0.9 %  sodium chloride infusion (0 mLs Intravenous Stopped 10/26/20 1726)  sodium chloride flush (NS) 0.9 % injection 3 mL (has no administration in time range)  sodium chloride flush (NS) 0.9 % injection 3 mL (has no administration in time range)  0.9 %  sodium chloride infusion (has no administration in time range)  oxyCODONE (Oxy IR/ROXICODONE) immediate release tablet 5-10 mg (has no administration in time range)  atorvastatin (LIPITOR) tablet 80 mg (80 mg Oral Given 10/26/20 1233)  aspirin chewable tablet 81 mg (0 mg Oral Duplicate 4/62/70 3500)  clopidogrel (PLAVIX) tablet 75 mg (has no administration in time range)  metoprolol succinate (TOPROL-XL) 24 hr tablet 12.5 mg (12.5 mg Oral Given 10/26/20 1437)  amLODipine (NORVASC) tablet 10 mg (10 mg Oral Given 10/26/20 1233)  losartan (COZAAR) tablet 50 mg (50 mg Oral Given 10/26/20 1233)  hydrochlorothiazide (MICROZIDE) capsule 12.5 mg (12.5 mg Oral Given 10/26/20 1233)  nitroGLYCERIN 50 mg in dextrose 5 % 250 mL (0.2 mg/mL) infusion (35 mcg/min Intravenous Infusion Verify 10/26/20 2055)  heparin ADULT infusion 100 units/mL (25000 units/226mL) (1,200 Units/hr Intravenous Infusion Verify 10/26/20 2055)  aspirin chewable tablet 324 mg (324 mg Oral Given 10/26/20 1002)  heparin injection 4,000 Units (4,000 Units Intravenous Given 10/26/20 1003)  nitroGLYCERIN 50 mg in dextrose 5 % 250 mL (0.2 mg/mL) infusion (0 mcg/min  Stopping Infusion hung by another clincian 10/26/20 1944)    ED Course  I have reviewed the triage vital signs and the nursing notes.  Pertinent labs & imaging results that  were available during my care of the patient were reviewed by me and considered in my medical decision making (see chart for details).    MDM Rules/Calculators/A&P                            45 year old female with a history of gestational diabetes, hypertension, hyperlipidemia, smoking,  presents with concern for chest pain, intermittent and worsening.  On arrival to the ED, triage ECG without significant findings. Orders placed. At time of my evaluation, she reports pain just began again just prior to my evaluation and is severe, STE noted on monitor and ECG obtained showing inferior STEMI.  Called Code STEMI.  During evalution pain improved again to 2/10 and monitor showing less elevation.  Do not feel hx and exam are consistent with aortic dissection, PE, normal pulses bilateral upper and lower extremities.  Given heparin bolus, aspirin, discussed with Dr. Tamala Julian and taken with carelink to the cath lab.   Final Clinical Impression(s) / ED Diagnoses Final diagnoses:  ST elevation myocardial infarction involving right coronary artery (HCC)  Chest pain, unspecified type    Rx / DC Orders ED Discharge Orders     None        Gareth Morgan, MD 10/26/20 2141

## 2020-10-27 ENCOUNTER — Encounter (HOSPITAL_COMMUNITY): Payer: Self-pay | Admitting: Interventional Cardiology

## 2020-10-27 ENCOUNTER — Other Ambulatory Visit (HOSPITAL_COMMUNITY)

## 2020-10-27 DIAGNOSIS — I2119 ST elevation (STEMI) myocardial infarction involving other coronary artery of inferior wall: Secondary | ICD-10-CM | POA: Diagnosis not present

## 2020-10-27 LAB — CBC
HCT: 40 % (ref 36.0–46.0)
Hemoglobin: 13.3 g/dL (ref 12.0–15.0)
MCH: 27 pg (ref 26.0–34.0)
MCHC: 33.3 g/dL (ref 30.0–36.0)
MCV: 81.3 fL (ref 80.0–100.0)
Platelets: 240 10*3/uL (ref 150–400)
RBC: 4.92 MIL/uL (ref 3.87–5.11)
RDW: 14.4 % (ref 11.5–15.5)
WBC: 7.7 10*3/uL (ref 4.0–10.5)
nRBC: 0 % (ref 0.0–0.2)

## 2020-10-27 LAB — TROPONIN I (HIGH SENSITIVITY)
Troponin I (High Sensitivity): 15 ng/L (ref <18)
Troponin I (High Sensitivity): 16 ng/L (ref <18)

## 2020-10-27 LAB — BASIC METABOLIC PANEL
Anion gap: 7 (ref 5–15)
BUN: 9 mg/dL (ref 6–20)
CO2: 22 mmol/L (ref 22–32)
Calcium: 8.7 mg/dL — ABNORMAL LOW (ref 8.9–10.3)
Chloride: 104 mmol/L (ref 98–111)
Creatinine, Ser: 1.32 mg/dL — ABNORMAL HIGH (ref 0.44–1.00)
GFR, Estimated: 51 mL/min — ABNORMAL LOW (ref >60)
Glucose, Bld: 133 mg/dL — ABNORMAL HIGH (ref 70–99)
Potassium: 4.9 mmol/L (ref 3.5–5.1)
Sodium: 133 mmol/L — ABNORMAL LOW (ref 135–145)

## 2020-10-27 LAB — HEPARIN LEVEL (UNFRACTIONATED): Heparin Unfractionated: <0.1 IU/mL — ABNORMAL LOW (ref 0.30–0.70)

## 2020-10-27 MED ORDER — HYDRALAZINE HCL 20 MG/ML IJ SOLN
5.0000 mg | INTRAMUSCULAR | Status: DC | PRN
  Administered 2020-10-27 – 2020-10-28 (×3): 5 mg via INTRAVENOUS
  Filled 2020-10-27 (×3): qty 1

## 2020-10-27 MED ORDER — HEPARIN SODIUM (PORCINE) 5000 UNIT/ML IJ SOLN
5000.0000 [IU] | Freq: Three times a day (TID) | INTRAMUSCULAR | Status: DC
  Administered 2020-10-27 – 2020-10-29 (×5): 5000 [IU] via SUBCUTANEOUS
  Filled 2020-10-27 (×5): qty 1

## 2020-10-27 MED ORDER — ISOSORBIDE MONONITRATE ER 30 MG PO TB24: 30.0000 mg | ORAL_TABLET | Freq: Every day | ORAL | Status: DC

## 2020-10-27 MED ORDER — INFLUENZA VAC SPLIT QUAD 0.5 ML IM SUSY
0.5000 mL | PREFILLED_SYRINGE | INTRAMUSCULAR | Status: AC
  Administered 2020-10-28: 0.5 mL via INTRAMUSCULAR
  Filled 2020-10-27: qty 0.5

## 2020-10-27 MED ORDER — METOPROLOL SUCCINATE ER 25 MG PO TB24
25.0000 mg | ORAL_TABLET | Freq: Every day | ORAL | Status: DC
  Administered 2020-10-27 – 2020-10-28 (×2): 25 mg via ORAL
  Filled 2020-10-27 (×2): qty 1

## 2020-10-27 MED ORDER — CHLORHEXIDINE GLUCONATE CLOTH 2 % EX PADS
6.0000 | MEDICATED_PAD | Freq: Every day | CUTANEOUS | Status: DC
  Administered 2020-10-27 – 2020-10-29 (×3): 6 via TOPICAL

## 2020-10-27 MED ORDER — ISOSORBIDE MONONITRATE ER 60 MG PO TB24
60.0000 mg | ORAL_TABLET | Freq: Every day | ORAL | Status: DC
  Administered 2020-10-27 – 2020-10-29 (×3): 60 mg via ORAL
  Filled 2020-10-27 (×3): qty 1

## 2020-10-27 MED ORDER — HEPARIN BOLUS VIA INFUSION
3000.0000 [IU] | Freq: Once | INTRAVENOUS | Status: AC
  Administered 2020-10-27: 3000 [IU] via INTRAVENOUS
  Filled 2020-10-27: qty 3000

## 2020-10-27 MED FILL — Heparin Sod (Porcine)-NaCl IV Soln 1000 Unit/500ML-0.9%: INTRAVENOUS | Qty: 1000 | Status: AC

## 2020-10-27 NOTE — Progress Notes (Signed)
CARDIAC REHAB PHASE I   PRE:  Rate/Rhythm: 60 SR    BP: sitting 150/82    SaO2:   MODE:  Ambulation: 410 ft   POST:  Rate/Rhythm: 86 SR    BP: sitting 155/99     SaO2:   Tolerated well, no CP. She does st she had CP earlier with making the bed and getting cleaned up. Resolved with rest (? If NTG was on, pt says no).   Discussed plaque, restrictions for wrist, and smoking cessation. Pt receptive and wanting to quit smoking, determined. Left heart healthy diet. Will f/u tomorrow for more education.  [place], ACSM 10/27/2020 1:47 PM

## 2020-10-27 NOTE — Plan of Care (Signed)
  Problem: Education: Goal: Understanding of CV disease, CV risk reduction, and recovery process will improve Outcome: Progressing   Problem: Activity: Goal: Ability to return to baseline activity level will improve Outcome: Progressing   Problem: Cardiovascular: Goal: Ability to achieve and maintain adequate cardiovascular perfusion will improve Outcome: Progressing Goal: Vascular access site(s) Level 0-1 will be maintained Outcome: Progressing   Problem: Education: Goal: Knowledge of General Education information will improve Description: Including pain rating scale, medication(s)/side effects and non-pharmacologic comfort measures Outcome: Progressing   Problem: Clinical Measurements: Goal: Diagnostic test results will improve Outcome: Progressing Goal: Respiratory complications will improve Outcome: Progressing Goal: Cardiovascular complication will be avoided Outcome: Progressing   Problem: Activity: Goal: Risk for activity intolerance will decrease Outcome: Progressing   Problem: Nutrition: Goal: Adequate nutrition will be maintained Outcome: Progressing   Problem: Coping: Goal: Level of anxiety will decrease Outcome: Progressing   Problem: Elimination: Goal: Will not experience complications related to bowel motility Outcome: Progressing Goal: Will not experience complications related to urinary retention Outcome: Progressing   Problem: Pain Managment: Goal: General experience of comfort will improve Outcome: Progressing

## 2020-10-27 NOTE — Progress Notes (Addendum)
Progress Note  Patient Name: Jodi Porter Date of Encounter: 10/27/2020  Primary Cardiologist: Dr. Linard Millers  Subjective   No recurrent chest pain.  Apparently had oxygen desaturation during evening while sleeping.  Inpatient Medications    Scheduled Meds:  amLODipine  10 mg Oral Daily   aspirin  81 mg Oral Daily   atorvastatin  80 mg Oral Daily   Chlorhexidine Gluconate Cloth  6 each Topical Daily   clopidogrel  75 mg Oral Q breakfast   hydrochlorothiazide  12.5 mg Oral Daily   [START ON 10/28/2020] influenza vac split quadrivalent PF  0.5 mL Intramuscular Tomorrow-1000   isosorbide mononitrate  30 mg Oral Daily   losartan  50 mg Oral Daily   metoprolol succinate  25 mg Oral Daily   sodium chloride flush  3 mL Intravenous Q12H   Continuous Infusions:  sodium chloride     heparin 1,200 Units/hr (10/27/20 0516)   nitroGLYCERIN 25 mcg/min (10/27/20 0516)   PRN Meds: sodium chloride, acetaminophen, ondansetron (ZOFRAN) IV, oxyCODONE, sodium chloride flush   Vital Signs    Vitals:   10/27/20 0830 10/27/20 0845 10/27/20 0900 10/27/20 0915  BP: (!) 166/88 (!) 156/77 (!) 147/69 133/69  Pulse: (!) 58 64 (!) 57 (!) 57  Resp:      Temp:      TempSrc:      SpO2: 98% 100% 99% 96%  Weight:      Height:        Intake/Output Summary (Last 24 hours) at 10/27/2020 0944 Last data filed at 10/27/2020 0516 Gross per 24 hour  Intake 715.86 ml  Output 1900 ml  Net -1184.14 ml    I/O since admission: - 1184  Filed Weights   10/26/20 1012  Weight: 102.1 kg    Telemetry    Sinus bradycardic at 57- Personally Reviewed  ECG    ECG (independently read by me): Sinus bradycardia  at 57; LVH; lateral STT changes  Physical Exam    BP 133/69   Pulse (!) 57   Temp 98.5 F (36.9 C) (Oral)   Resp 18   Ht 5\' 2"  (1.575 m)   Wt 102.1 kg   SpO2 96%   BMI 41.15 kg/m  General: Alert, oriented, no distress.  Morbid obesity Skin: normal turgor, no rashes, warm and  dry HEENT: Normocephalic, atraumatic. Pupils equal round and reactive to light; sclera anicteric; extraocular muscles intact;  Nose without nasal septal hypertrophy Mouth/Parynx benign; Mallinpatti scale 4 Neck: No JVD, no carotid bruits; normal carotid upstroke Lungs: clear to ausculatation and percussion; no wheezing or rales Chest wall: without tenderness to palpitation Heart: PMI not displaced, RRR, s1 s2 normal, 1-8/2 systolic murmur, no diastolic murmur, no rubs, gallops, thrills, or heaves Abdomen: soft, nontender; no hepatosplenomehaly, BS+; abdominal aorta nontender and not dilated by palpation. Back: no CVA tenderness Pulses 2+ right radial site Musculoskeletal: full range of motion, normal strength, no joint deformities Extremities: no clubbing cyanosis or edema, Homan's sign negative  Neurologic: grossly nonfocal; Cranial nerves grossly wnl Psychologic: Normal mood and affect   Labs    Chemistry Recent Labs  Lab 10/26/20 0952 10/26/20 1540 10/27/20 0158  NA 137 137 133*  K 3.8 3.5 4.9  CL 103 105 104  CO2 28 21* 22  GLUCOSE 129* 111* 133*  BUN 12 11 9   CREATININE 1.19* 1.23* 1.32*  CALCIUM 9.1 8.8* 8.7*  PROT 7.7  --   --   ALBUMIN 3.7  --   --  AST 15  --   --   ALT 15  --   --   ALKPHOS 88  --   --   BILITOT 0.3  --   --   GFRNONAA 57* 55* 51*  ANIONGAP 6 11 7      Hematology Recent Labs  Lab 10/26/20 0952 10/26/20 1540 10/27/20 0158  WBC 5.7 5.9 7.7  RBC 5.22* 5.20* 4.92  HGB 14.1 14.1 13.3  HCT 42.8 42.6 40.0  MCV 82.0 81.9 81.3  MCH 27.0 27.1 27.0  MCHC 32.9 33.1 33.3  RDW 14.5 14.5 14.4  PLT 263 245 240   HS Trop: 8 > 14  Cardiac EnzymesNo results for input(s): TROPONINI in the last 168 hours. No results for input(s): TROPIPOC in the last 168 hours.   BNPNo results for input(s): BNP, PROBNP in the last 168 hours.   DDimer No results for input(s): DDIMER in the last 168 hours.   Lipid Panel     Component Value Date/Time   CHOL 152  10/26/2020 1540   TRIG 153 (H) 10/26/2020 1540   HDL 32 (L) 10/26/2020 1540   CHOLHDL 4.8 10/26/2020 1540   VLDL 31 10/26/2020 1540   LDLCALC 89 10/26/2020 1540     Radiology    CARDIAC CATHETERIZATION  Result Date: 10/26/2020 CONCLUSIONS Mid and distal eccentric 50 to 60% RCA stenoses.  Diffuse disease and or scad in the second left ventricular branch of the right coronary.  Suspect transient thrombotic occlusion of the mid or distal RCA lesion.  Remote possibility is coronary artery spasm. Widely patent LAD with diffuse nonobstructive atherosclerosis. Widely patent circumflex with mild proximal and mid plaque. Widely patent left main Normal left ventricular systolic function with EF 65%.  Severe elevation in LVEDP 25 mmHg, compatible with acute on chronic diastolic heart failure. RECOMMENDATIONS: Acute coronary syndrome with demonstrated inferior ST elevation related to transient thrombosis with reperfusion or coronary spasm in a patient with risk factors for either process. Loaded with dual antiplatelet therapy (aspirin and Plavix); IV nitroglycerin for least 24 hours; resume IV heparin when appropriate after hemostasis; aggressive risk factor modification with high intensity statin therapy (target LDL less than 55 given high risk and young age) and multi prolonged antihypertensive regimen (to include calcium channel blocker therapy/beta-blocker therapy/diuretic therapy/ARB). Check hemoglobin A1c, LP(a), hs-CRP.   DG Chest Port 1 View  Result Date: 10/26/2020 CLINICAL DATA:  cp chest pain EXAM: PORTABLE CHEST 1 VIEW COMPARISON:  October 21, 2020, March twelfth 2020 FINDINGS: The cardiomediastinal silhouette is unchanged in contour. No pleural effusion. No pneumothorax. No acute pleuroparenchymal abnormality. Visualized abdomen is unremarkable. Lucency of the RIGHT humeral head, likely degenerative in etiology. IMPRESSION: No acute cardiopulmonary abnormality. Electronically Signed   By:  Valentino Saxon M.D.   On: 10/26/2020 10:09    Cardiac Studies   CONCLUSIONS Mid and distal eccentric 50 to 60% RCA stenoses.  Diffuse disease and or scad in the second left ventricular branch of the right coronary.  Suspect transient thrombotic occlusion of the mid or distal RCA lesion.  Remote possibility is coronary artery spasm. Widely patent LAD with diffuse nonobstructive atherosclerosis. Widely patent circumflex with mild proximal and mid plaque. Widely patent left main Normal left ventricular systolic function with EF 65%.  Severe elevation in LVEDP 25 mmHg, compatible with acute on chronic diastolic heart failure.   RECOMMENDATIONS:   Acute coronary syndrome with demonstrated inferior ST elevation related to transient thrombosis with reperfusion or coronary spasm in a patient with  risk factors for either process. Loaded with dual antiplatelet therapy (aspirin and Plavix); IV nitroglycerin for least 24 hours; resume IV heparin when appropriate after hemostasis; aggressive risk factor modification with high intensity statin therapy (target LDL less than 55 given high risk and young age) and multi prolonged antihypertensive regimen (to include calcium channel blocker therapy/beta-blocker therapy/diuretic therapy/ARB). Check hemoglobin A1c, LP(a), hs-CRP.   I Patient Profile     45 y.o. female who has a history of hypertension,    gestational diabetes, hyperlipidemia, and has been experiencing intermittent chest pain over the past month.  She presented yesterday to have acute coronary syndrome and underwent emergent cardiac catheterization..  Assessment & Plan    Day 1 s/p ACS: Catheterization findings reviewed with patient.  Initial plan for medical therapy.  Currently on amlodipine 10 mg, low-dose metoprolol succinate 25 mg daily, losartan 50 mg, HCTZ 12.5 mg.  ECG shows LVH with lateral ST-T abnormality.  2D echo Doppler study will be done today.  Now on aspirin/Plavix.  She has  been pain-free.  We will try to DC IV heparin.  We will increase isosorbide to 60 mg and wean and DC IV nitroglycerin. Hypertension: Long history of hypertension commencing at age 41.  Aim for target blood pressure ideally less than 120/80. High likelihood for obstructive sleep apnea: Upon further questioning, the patient wakes up almost every hour at night, has frequent nocturia, nonrestorative sleep, and residual daytime sleepiness.  Last evening she had oxygen desaturation to 84% while sleeping.  Mallampati scale is 4 and she is morbidly obese.  We will need comprehensive sleep evaluation post hospital discharge, with probable need for CPAP therapy History of tobacco use: Patient states she started smoking in her early 27s.  She states that she did not have hardly any cigarettes the week leading up to her presentation. Morbid obesity: BMI 41.2.  Weight loss and increased exercise will be strongly encouraged Hyperlipidemia: Target LDL less than 70, but aim for less than 55 for more aggressive treatment; now on atorvastatin 80 mg History of gestational diabetes.  Check hemoglobin A1c.  Plan for 2D echo Doppler study today.  Medication adjustment as above.  Will monitor in ICU today with probable transfer to floor tomorrow a.m.  Signed, Troy Sine, MD, [facility] 10/27/2020, 9:44 AM

## 2020-10-27 NOTE — Progress Notes (Signed)
ANTICOAGULATION CONSULT NOTE - Consult  Pharmacy Consult for Heparin Indication:  ACS/STEMI  Allergies  Allergen Reactions   Penicillins Rash   Tramadol     Shake and get nauseated    Patient Measurements: Height: 5\' 2"  (157.5 cm) Weight: 102.1 kg (225 lb) IBW/kg (Calculated) : 50.1 Heparin Dosing Weight: 100 kg (estimated)  Vital Signs: Temp: 98.7 F (37.1 C) (09/25 1900) Temp Source: Oral (09/25 1900) BP: 168/101 (09/26 0500) Pulse Rate: 51 (09/26 0500)  Labs: Recent Labs    10/26/20 0952 10/26/20 1540 10/27/20 0158  HGB 14.1 14.1 13.3  HCT 42.8 42.6 40.0  PLT 263 245 240  APTT 31  --   --   LABPROT 13.0  --   --   INR 1.0  --   --   HEPARINUNFRC  --   --  <0.10*  CREATININE 1.19* 1.23* 1.32*  TROPONINIHS 8  --   --      Estimated Creatinine Clearance: 60.2 mL/min (A) (by C-G formula based on SCr of 1.32 mg/dL (H)).   Medical History: Past Medical History:  Diagnosis Date   Gestational diabetes    Gestational diabetes    Hypertension     Medications:  Medications Prior to Admission  Medication Sig Dispense Refill Last Dose   amLODipine (NORVASC) 10 MG tablet Take 1 tablet (10 mg total) by mouth daily. 30 tablet 0 10/26/2020   cloNIDine (CATAPRES) 0.1 MG tablet Take 0.1 mg by mouth 2 (two) times daily.   10/26/2020   amLODipine (NORVASC) 10 MG tablet Take 1 tablet (10 mg total) by mouth daily. 30 tablet 0    hydrochlorothiazide (HYDRODIURIL) 25 MG tablet Take 1 tablet (25 mg total) by mouth daily. (Patient not taking: No sig reported) 30 tablet 0 Not Taking   HYDROcodone-acetaminophen (NORCO/VICODIN) 5-325 MG per tablet Take 1-2 tablets by mouth every 6 hours as needed for pain and/or cough. (Patient not taking: No sig reported) 7 tablet 0 Not Taking   lisinopril (ZESTRIL) 20 MG tablet Take 1 tablet (20 mg total) by mouth daily. (Patient not taking: Reported on 10/26/2020) 30 tablet 0 Not Taking   lisinopril-hydrochlorothiazide (ZESTORETIC) 20-25 MG tablet  Take 1 tablet by mouth daily. 30 tablet 0    methocarbamol (ROBAXIN) 750 MG tablet Take 1-2 tablets (750-1,500 mg total) by mouth 3 (three) times daily as needed for muscle spasms. (Patient not taking: Reported on 10/26/2020) 18 tablet 0 Not Taking   metoprolol (LOPRESSOR) 25 MG tablet Take 1 tablet (25 mg total) by mouth 2 (two) times daily. (Patient not taking: Reported on 10/26/2020) 60 tablet 0 Not Taking    Scheduled:   amLODipine  10 mg Oral Daily   aspirin  81 mg Oral Daily   atorvastatin  80 mg Oral Daily   Chlorhexidine Gluconate Cloth  6 each Topical Daily   clopidogrel  75 mg Oral Q breakfast   hydrochlorothiazide  12.5 mg Oral Daily   [START ON 10/28/2020] influenza vac split quadrivalent PF  0.5 mL Intramuscular Tomorrow-1000   losartan  50 mg Oral Daily   metoprolol succinate  12.5 mg Oral Daily   sodium chloride flush  3 mL Intravenous Q12H   Infusions:   sodium chloride     heparin 1,200 Units/hr (10/27/20 0516)   nitroGLYCERIN 25 mcg/min (10/27/20 0516)   PRN:   Assessment: 51 yof with a history of HTN and intermittent chest pain. Patient is presenting with Inferior STEMI. Heparin per pharmacy consult placed for  ACS/STEMI .  Patient is on not on anticoagulation prior to arrival. H/h stable S/p cath non-obstructive CAD, possible vasospasm  Treat HTN Heparin level subtherapeutic today at <0.1. No reported signs/symptoms of bleeding. CBC stable.  Goal of Therapy:  Heparin level 0.3-0.7 units/ml Monitor platelets by anticoagulation protocol: Yes   Plan:  Bolus with 3000 units of heparin Increase heparin drip to 1500 units/hr  Will stop heparin after 24 hours per MD  Thank you for allowing pharmacy to participate in this patient's care.  Reatha Harps, PharmD PGY1 Pharmacy Resident 10/27/2020 6:22 AM Check [email or web address] for unit specific pharmacy number

## 2020-10-28 ENCOUNTER — Inpatient Hospital Stay (HOSPITAL_COMMUNITY)

## 2020-10-28 DIAGNOSIS — I248 Other forms of acute ischemic heart disease: Secondary | ICD-10-CM | POA: Diagnosis not present

## 2020-10-28 DIAGNOSIS — I2119 ST elevation (STEMI) myocardial infarction involving other coronary artery of inferior wall: Secondary | ICD-10-CM | POA: Diagnosis not present

## 2020-10-28 LAB — ECHOCARDIOGRAM COMPLETE
AR max vel: 1.73 cm2
AV Area VTI: 2.03 cm2
AV Area mean vel: 1.73 cm2
AV Mean grad: 18.3 mmHg
AV Peak grad: 36.8 mmHg
Ao pk vel: 3.03 m/s
Area-P 1/2: 2.21 cm2
Height: 62 in
S' Lateral: 2.6 cm
Weight: 3600 oz

## 2020-10-28 LAB — CBC
HCT: 41.6 % (ref 36.0–46.0)
Hemoglobin: 13.9 g/dL (ref 12.0–15.0)
MCH: 27.1 pg (ref 26.0–34.0)
MCHC: 33.4 g/dL (ref 30.0–36.0)
MCV: 81.1 fL (ref 80.0–100.0)
Platelets: 214 10*3/uL (ref 150–400)
RBC: 5.13 MIL/uL — ABNORMAL HIGH (ref 3.87–5.11)
RDW: 14.2 % (ref 11.5–15.5)
WBC: 8.2 10*3/uL (ref 4.0–10.5)
nRBC: 0 % (ref 0.0–0.2)

## 2020-10-28 LAB — BASIC METABOLIC PANEL
Anion gap: 10 (ref 5–15)
BUN: 14 mg/dL (ref 6–20)
CO2: 20 mmol/L — ABNORMAL LOW (ref 22–32)
Calcium: 9 mg/dL (ref 8.9–10.3)
Chloride: 103 mmol/L (ref 98–111)
Creatinine, Ser: 1.14 mg/dL — ABNORMAL HIGH (ref 0.44–1.00)
GFR, Estimated: >60 mL/min (ref >60)
Glucose, Bld: 126 mg/dL — ABNORMAL HIGH (ref 70–99)
Potassium: 3.5 mmol/L (ref 3.5–5.1)
Sodium: 133 mmol/L — ABNORMAL LOW (ref 135–145)

## 2020-10-28 LAB — HIGH SENSITIVITY CRP: CRP, High Sensitivity: 8.4 mg/L — ABNORMAL HIGH (ref 0.00–3.00)

## 2020-10-28 LAB — LIPOPROTEIN A (LPA): Lipoprotein (a): 95.4 nmol/L — ABNORMAL HIGH (ref <75.0)

## 2020-10-28 MED ORDER — POTASSIUM CHLORIDE CRYS ER 20 MEQ PO TBCR
40.0000 meq | EXTENDED_RELEASE_TABLET | Freq: Once | ORAL | Status: AC
  Administered 2020-10-28: 40 meq via ORAL
  Filled 2020-10-28: qty 2

## 2020-10-28 MED ORDER — METOPROLOL SUCCINATE ER 50 MG PO TB24
50.0000 mg | ORAL_TABLET | Freq: Every day | ORAL | Status: DC
  Administered 2020-10-29: 50 mg via ORAL
  Filled 2020-10-28: qty 1

## 2020-10-28 MED ORDER — METOPROLOL SUCCINATE ER 25 MG PO TB24
25.0000 mg | ORAL_TABLET | Freq: Once | ORAL | Status: AC
  Administered 2020-10-28: 25 mg via ORAL
  Filled 2020-10-28: qty 1

## 2020-10-28 MED ORDER — PERFLUTREN LIPID MICROSPHERE
1.0000 mL | INTRAVENOUS | Status: AC | PRN
  Administered 2020-10-28: 3 mL via INTRAVENOUS
  Filled 2020-10-28: qty 10

## 2020-10-28 NOTE — Progress Notes (Addendum)
CARDIAC REHAB PHASE I   PRE:  Rate/Rhythm: 70 SR    BP: sitting 149/75    SaO2: 93 RA  MODE:  Ambulation: 580 ft   POST:  Rate/Rhythm: 81 SR    BP: sitting 145/88     SaO2: 92 RA  Tolerated well, no CP. SaO2 on low side. Discussed diet, A1C, exercise, NTG, and CRPII. Pt receptive, wanting change. Reviewing notes, pt now not deemed MI, therefore n/a for CRPII. Will not refer. [place], ACSM 10/28/2020 1:46 PM

## 2020-10-28 NOTE — Progress Notes (Signed)
Progress Note  Patient Name: Jodi Porter Date of Encounter: 10/28/2020  Primary Cardiologist: Dr. Linard Millers  Subjective   No recurrent chest pain.  Apparently had oxygen desaturation during evening while sleeping.  Inpatient Medications    Scheduled Meds:  amLODipine  10 mg Oral Daily   aspirin  81 mg Oral Daily   atorvastatin  80 mg Oral Daily   Chlorhexidine Gluconate Cloth  6 each Topical Daily   clopidogrel  75 mg Oral Q breakfast   heparin injection (subcutaneous)  5,000 Units Subcutaneous Q8H   hydrochlorothiazide  12.5 mg Oral Daily   isosorbide mononitrate  60 mg Oral Daily   losartan  50 mg Oral Daily   metoprolol succinate  25 mg Oral Daily   sodium chloride flush  3 mL Intravenous Q12H   Continuous Infusions:  sodium chloride     nitroGLYCERIN Stopped (10/27/20 1445)   PRN Meds: sodium chloride, acetaminophen, hydrALAZINE, ondansetron (ZOFRAN) IV, oxyCODONE, perflutren lipid microspheres (DEFINITY) IV suspension, sodium chloride flush   Vital Signs    Vitals:   10/28/20 0715 10/28/20 0730 10/28/20 0800 10/28/20 0815  BP:      Pulse: 62 65  74  Resp:   14   Temp:      TempSrc:      SpO2: 97% 97% 98% 98%  Weight:      Height:        Intake/Output Summary (Last 24 hours) at 10/28/2020 1025 Last data filed at 10/28/2020 0800 Gross per 24 hour  Intake 539.64 ml  Output 2000 ml  Net -1460.36 ml    I/O since admission: - 2311  Filed Weights   10/26/20 1012  Weight: 102.1 kg    Telemetry    Sinus bradycardic at 57- Personally Reviewed  ECG    10/28/2020 ECG (independently read by me): NSR at 66, LVH, nonspecific T changes  10/27/2020 ECG (independently read by me): Sinus bradycardia  at 57; LVH; lateral STT changes  Physical Exam   BP (!) 151/95   Pulse 74   Temp 98.5 F (36.9 C) (Oral)   Resp 14   Ht 5\' 2"  (1.575 m)   Wt 102.1 kg   SpO2 98%   BMI 41.15 kg/m  General: Alert, oriented, no distress.  Skin: normal turgor, no  rashes, warm and dry HEENT: Normocephalic, atraumatic. Pupils equal round and reactive to light; sclera anicteric; extraocular muscles intact;  Nose without nasal septal hypertrophy Mouth/Parynx benign; Mallinpatti scale 4 Neck: Thick neck; No JVD, no carotid bruits; normal carotid upstroke Lungs: clear to ausculatation and percussion; no wheezing or rales Chest wall: without tenderness to palpitation Heart: PMI not displaced, RRR, s1 s2 normal, 1/6 systolic murmur, no diastolic murmur, no rubs, gallops, thrills, or heaves Abdomen: central adiposity; soft, nontender; no hepatosplenomehaly, BS+; abdominal aorta nontender and not dilated by palpation. Back: no CVA tenderness Pulses 2+ Musculoskeletal: full range of motion, normal strength, no joint deformities Extremities: no clubbing cyanosis or edema, Homan's sign negative  Neurologic: grossly nonfocal; Cranial nerves grossly wnl Psychologic: Normal mood and affect    Labs    Chemistry Recent Labs  Lab 10/26/20 0952 10/26/20 1540 10/27/20 0158 10/28/20 0052  NA 137 137 133* 133*  K 3.8 3.5 4.9 3.5  CL 103 105 104 103  CO2 28 21* 22 20*  GLUCOSE 129* 111* 133* 126*  BUN 12 11 9 14   CREATININE 1.19* 1.23* 1.32* 1.14*  CALCIUM 9.1 8.8* 8.7* 9.0  PROT 7.7  --   --   --  ALBUMIN 3.7  --   --   --   AST 15  --   --   --   ALT 15  --   --   --   ALKPHOS 88  --   --   --   BILITOT 0.3  --   --   --   GFRNONAA 57* 55* 51* >60  ANIONGAP 6 11 7 10      Hematology Recent Labs  Lab 10/26/20 1540 10/27/20 0158 10/28/20 0052  WBC 5.9 7.7 8.2  RBC 5.20* 4.92 5.13*  HGB 14.1 13.3 13.9  HCT 42.6 40.0 41.6  MCV 81.9 81.3 81.1  MCH 27.1 27.0 27.1  MCHC 33.1 33.3 33.4  RDW 14.5 14.4 14.2  PLT 245 240 214   HS Trop: 8 > 14  Cardiac EnzymesNo results for input(s): TROPONINI in the last 168 hours. No results for input(s): TROPIPOC in the last 168 hours.   BNPNo results for input(s): BNP, PROBNP in the last 168 hours.    DDimer No results for input(s): DDIMER in the last 168 hours.   Lipid Panel     Component Value Date/Time   CHOL 152 10/26/2020 1540   TRIG 153 (H) 10/26/2020 1540   HDL 32 (L) 10/26/2020 1540   CHOLHDL 4.8 10/26/2020 1540   VLDL 31 10/26/2020 1540   LDLCALC 89 10/26/2020 1540     Radiology    CARDIAC CATHETERIZATION  Result Date: 10/26/2020 CONCLUSIONS Mid and distal eccentric 50 to 60% RCA stenoses.  Diffuse disease and or scad in the second left ventricular branch of the right coronary.  Suspect transient thrombotic occlusion of the mid or distal RCA lesion.  Remote possibility is coronary artery spasm. Widely patent LAD with diffuse nonobstructive atherosclerosis. Widely patent circumflex with mild proximal and mid plaque. Widely patent left main Normal left ventricular systolic function with EF 65%.  Severe elevation in LVEDP 25 mmHg, compatible with acute on chronic diastolic heart failure. RECOMMENDATIONS: Acute coronary syndrome with demonstrated inferior ST elevation related to transient thrombosis with reperfusion or coronary spasm in a patient with risk factors for either process. Loaded with dual antiplatelet therapy (aspirin and Plavix); IV nitroglycerin for least 24 hours; resume IV heparin when appropriate after hemostasis; aggressive risk factor modification with high intensity statin therapy (target LDL less than 55 given high risk and young age) and multi prolonged antihypertensive regimen (to include calcium channel blocker therapy/beta-blocker therapy/diuretic therapy/ARB). Check hemoglobin A1c, LP(a), hs-CRP.    Cardiac Studies   CONCLUSIONS Mid and distal eccentric 50 to 60% RCA stenoses.  Diffuse disease and or scad in the second left ventricular branch of the right coronary.  Suspect transient thrombotic occlusion of the mid or distal RCA lesion.  Remote possibility is coronary artery spasm. Widely patent LAD with diffuse nonobstructive atherosclerosis. Widely  patent circumflex with mild proximal and mid plaque. Widely patent left main Normal left ventricular systolic function with EF 65%.  Severe elevation in LVEDP 25 mmHg, compatible with acute on chronic diastolic heart failure.   RECOMMENDATIONS:   Acute coronary syndrome with demonstrated inferior ST elevation related to transient thrombosis with reperfusion or coronary spasm in a patient with risk factors for either process. Loaded with dual antiplatelet therapy (aspirin and Plavix); IV nitroglycerin for least 24 hours; resume IV heparin when appropriate after hemostasis; aggressive risk factor modification with high intensity statin therapy (target LDL less than 55 given high risk and young age) and multi prolonged antihypertensive regimen (to include calcium  channel blocker therapy/beta-blocker therapy/diuretic therapy/ARB). Check hemoglobin A1c, LP(a), hs-CRP.   I Patient Profile     45 y.o. female who has a history of hypertension,    gestational diabetes, hyperlipidemia, and has been experiencing intermittent chest pain over the past month.  She presented yesterday to have acute coronary syndrome and underwent emergent cardiac catheterization..  Assessment & Plan    Day 2 s/p ACS: Catheterization findings reviewed with patient.  Initial plan for medical therapy.  Currently on amlodipine 10 mg, low-dose metoprolol succinate 25 mg daily, losartan 50 mg, HCTZ 12.5 mg.  ECG shows LVH with lateral ST-T abnormality.  2D echo Doppler study was done, not yet interpreted. Now on aspirin/Plavix.  She is now off iv NTG and on isosorbide 60 mg.  Resting pulse in the 80s will increase metoprolol succinate to 50 mg daily.  Hypertension: Long history of hypertension commencing at age 66.  Aim for target blood pressure ideally less than 120/80.  High likelihood for obstructive sleep apnea: Upon further questioning, the patient wakes up almost every hour at night, has frequent nocturia, nonrestorative  sleep, and residual daytime sleepiness.  She had oxygen desaturation to 84% while sleeping.  Mallampati scale is 4 and she is morbidly obese.  We will need comprehensive sleep evaluation post hospital discharge, with probable need for CPAP therapy.  4.  Hpokalemia: K 3.5; replete  5.  History of tobacco use: Patient states she started smoking in her early 40s.        She did not have cigarettes the week prior to her admission due to pain.  6.   Morbid obesity: BMI 41.2.  Weight loss and increased exercise will be strongly encouraged  7.  Hyperlipidemia: Target LDL less than 70, but aim for less than 55 for more aggressive    treatment; now on atorvastatin 80 mg  8.  History of gestational diabetes.  Check hemoglobin A1c.  Will transfer to cardiac telemetry today; await echo results, tentatively plan DC tomorrow.    Signed, Troy Sine, MD, [facility] 10/28/2020, 10:25 AM

## 2020-10-28 NOTE — Progress Notes (Signed)
  Echocardiogram 2D Echocardiogram has been performed.  Jodi Porter 10/28/2020, 9:19 AM

## 2020-10-28 NOTE — Plan of Care (Signed)
  Problem: Education: Goal: Understanding of CV disease, CV risk reduction, and recovery process will improve Outcome: Progressing Goal: Individualized Educational Video(s) Outcome: Progressing   Problem: Activity: Goal: Ability to return to baseline activity level will improve Outcome: Progressing   Problem: Cardiovascular: Goal: Ability to achieve and maintain adequate cardiovascular perfusion will improve Outcome: Progressing Goal: Vascular access site(s) Level 0-1 will be maintained Outcome: Progressing   Problem: Health Behavior/Discharge Planning: Goal: Ability to safely manage health-related needs after discharge will improve Outcome: Progressing   Problem: Education: Goal: Knowledge of General Education information will improve Description: Including pain rating scale, medication(s)/side effects and non-pharmacologic comfort measures Outcome: Progressing   Problem: Health Behavior/Discharge Planning: Goal: Ability to manage health-related needs will improve Outcome: Progressing   Problem: Clinical Measurements: Goal: Ability to maintain clinical measurements within normal limits will improve Outcome: Progressing Goal: Will remain free from infection Outcome: Progressing Goal: Diagnostic test results will improve Outcome: Progressing Goal: Respiratory complications will improve Outcome: Progressing Goal: Cardiovascular complication will be avoided Outcome: Progressing   Problem: Activity: Goal: Risk for activity intolerance will decrease Outcome: Progressing   Problem: Nutrition: Goal: Adequate nutrition will be maintained Outcome: Progressing   Problem: Pain Managment: Goal: General experience of comfort will improve Outcome: Progressing   Problem: Safety: Goal: Ability to remain free from injury will improve Outcome: Progressing

## 2020-10-29 DIAGNOSIS — G4733 Obstructive sleep apnea (adult) (pediatric): Secondary | ICD-10-CM

## 2020-10-29 DIAGNOSIS — E118 Type 2 diabetes mellitus with unspecified complications: Secondary | ICD-10-CM

## 2020-10-29 LAB — CBC
HCT: 43.9 % (ref 36.0–46.0)
Hemoglobin: 14.1 g/dL (ref 12.0–15.0)
MCH: 26.4 pg (ref 26.0–34.0)
MCHC: 32.1 g/dL (ref 30.0–36.0)
MCV: 82.2 fL (ref 80.0–100.0)
Platelets: 241 10*3/uL (ref 150–400)
RBC: 5.34 MIL/uL — ABNORMAL HIGH (ref 3.87–5.11)
RDW: 14.4 % (ref 11.5–15.5)
WBC: 7.6 10*3/uL (ref 4.0–10.5)
nRBC: 0 % (ref 0.0–0.2)

## 2020-10-29 LAB — BASIC METABOLIC PANEL
Anion gap: 9 (ref 5–15)
BUN: 13 mg/dL (ref 6–20)
CO2: 19 mmol/L — ABNORMAL LOW (ref 22–32)
Calcium: 9.2 mg/dL (ref 8.9–10.3)
Chloride: 104 mmol/L (ref 98–111)
Creatinine, Ser: 1.16 mg/dL — ABNORMAL HIGH (ref 0.44–1.00)
GFR, Estimated: 59 mL/min — ABNORMAL LOW (ref >60)
Glucose, Bld: 113 mg/dL — ABNORMAL HIGH (ref 70–99)
Potassium: 4.1 mmol/L (ref 3.5–5.1)
Sodium: 132 mmol/L — ABNORMAL LOW (ref 135–145)

## 2020-10-29 MED ORDER — NITROGLYCERIN 0.4 MG SL SUBL: 0.4000 mg | SUBLINGUAL_TABLET | SUBLINGUAL | 2 refills | Status: DC | PRN

## 2020-10-29 MED ORDER — LOSARTAN POTASSIUM 50 MG PO TABS: 50.0000 mg | ORAL_TABLET | Freq: Every day | ORAL | 1 refills | Status: DC

## 2020-10-29 MED ORDER — CLOPIDOGREL BISULFATE 75 MG PO TABS: 75.0000 mg | ORAL_TABLET | Freq: Every day | ORAL | 1 refills | Status: DC

## 2020-10-29 MED ORDER — ATORVASTATIN CALCIUM 80 MG PO TABS: 80.0000 mg | ORAL_TABLET | Freq: Every day | ORAL | 1 refills | Status: DC

## 2020-10-29 MED ORDER — ASPIRIN 81 MG PO CHEW: 81.0000 mg | CHEWABLE_TABLET | Freq: Every day | ORAL | 1 refills | Status: DC

## 2020-10-29 MED ORDER — HYDROCHLOROTHIAZIDE 12.5 MG PO CAPS: 12.5000 mg | ORAL_CAPSULE | Freq: Every day | ORAL | 1 refills | Status: DC

## 2020-10-29 MED ORDER — ISOSORBIDE MONONITRATE ER 60 MG PO TB24: 60.0000 mg | ORAL_TABLET | Freq: Every day | ORAL | 1 refills | Status: DC

## 2020-10-29 MED ORDER — AMLODIPINE BESYLATE 10 MG PO TABS: 10.0000 mg | ORAL_TABLET | Freq: Every day | ORAL | 1 refills | Status: DC

## 2020-10-29 MED ORDER — METOPROLOL SUCCINATE ER 50 MG PO TB24: 50.0000 mg | ORAL_TABLET | Freq: Every day | ORAL | 1 refills | Status: DC

## 2020-10-29 NOTE — Progress Notes (Signed)
Progress Note  Patient Name: Jodi Porter Date of Encounter: 10/29/2020  Primary Cardiologist: Dr. Linard Millers  Subjective   No recurrent chest pain.  Apparently had oxygen desaturation during evening while sleeping.  Inpatient Medications    Scheduled Meds:  amLODipine  10 mg Oral Daily   aspirin  81 mg Oral Daily   atorvastatin  80 mg Oral Daily   Chlorhexidine Gluconate Cloth  6 each Topical Daily   clopidogrel  75 mg Oral Q breakfast   heparin injection (subcutaneous)  5,000 Units Subcutaneous Q8H   hydrochlorothiazide  12.5 mg Oral Daily   isosorbide mononitrate  60 mg Oral Daily   losartan  50 mg Oral Daily   metoprolol succinate  50 mg Oral Daily   sodium chloride flush  3 mL Intravenous Q12H   Continuous Infusions:  sodium chloride     PRN Meds: sodium chloride, acetaminophen, hydrALAZINE, ondansetron (ZOFRAN) IV, oxyCODONE, sodium chloride flush   Vital Signs    Vitals:   10/29/20 0600 10/29/20 0700 10/29/20 0800 10/29/20 0857  BP:   (!) 139/93 (!) 141/84  Pulse: 63 61 (!) 58   Resp:      Temp:   98.8 F (37.1 C)   TempSrc:   Oral   SpO2: 98% 98% 100%   Weight:      Height:        Intake/Output Summary (Last 24 hours) at 10/29/2020 0926 Last data filed at 10/28/2020 1200 Gross per 24 hour  Intake 400 ml  Output --  Net 400 ml    I/O since admission: - 1911  Filed Weights   10/26/20 1012  Weight: 102.1 kg    Telemetry    Sinus rhythm at 63- Personally Reviewed  ECG    10/29/2020 ECG (independently read by me):  SB at 56; LVH, T wave wave I,aVL  10/28/2020 ECG (independently read by me): NSR at 66, LVH, nonspecific T changes  10/27/2020 ECG (independently read by me): Sinus bradycardia  at 57; LVH; lateral STT changes  Physical Exam   BP (!) 141/84   Pulse (!) 58   Temp 98.8 F (37.1 C) (Oral)   Resp 14   Ht 5\' 2"  (1.575 m)   Wt 102.1 kg   SpO2 100%   BMI 41.15 kg/m  General: Alert, oriented, no distress.  Skin: normal  turgor, no rashes, warm and dry HEENT: Normocephalic, atraumatic. Pupils equal round and reactive to light; sclera anicteric; extraocular muscles intact;  Nose without nasal septal hypertrophy Mouth/Parynx benign; Mallinpatti scale 4 Neck: Thick neck; No JVD, no carotid bruits; normal carotid upstroke Lungs: clear to ausculatation and percussion; no wheezing or rales Chest wall: without tenderness to palpitation Heart: PMI not displaced, RRR, s1 s2 normal, 1/6 systolic murmur, no diastolic murmur, no rubs, gallops, thrills, or heaves Abdomen: central adiposity; soft, nontender; no hepatosplenomehaly, BS+; abdominal aorta nontender and not dilated by palpation. Back: no CVA tenderness Pulses 2+ Musculoskeletal: full range of motion, normal strength, no joint deformities Extremities: no clubbing cyanosis or edema, Homan's sign negative  Neurologic: grossly nonfocal; Cranial nerves grossly wnl Psychologic: Normal mood and affect   Labs    Chemistry Recent Labs  Lab 10/26/20 0952 10/26/20 1540 10/27/20 0158 10/28/20 0052 10/29/20 0102  NA 137   < > 133* 133* 132*  K 3.8   < > 4.9 3.5 4.1  CL 103   < > 104 103 104  CO2 28   < > 22 20* 19*  GLUCOSE 129*   < > 133* 126* 113*  BUN 12   < > 9 14 13   CREATININE 1.19*   < > 1.32* 1.14* 1.16*  CALCIUM 9.1   < > 8.7* 9.0 9.2  PROT 7.7  --   --   --   --   ALBUMIN 3.7  --   --   --   --   AST 15  --   --   --   --   ALT 15  --   --   --   --   ALKPHOS 88  --   --   --   --   BILITOT 0.3  --   --   --   --   GFRNONAA 57*   < > 51* >60 59*  ANIONGAP 6   < > 7 10 9    < > = values in this interval not displayed.     Hematology Recent Labs  Lab 10/27/20 0158 10/28/20 0052 10/29/20 0102  WBC 7.7 8.2 7.6  RBC 4.92 5.13* 5.34*  HGB 13.3 13.9 14.1  HCT 40.0 41.6 43.9  MCV 81.3 81.1 82.2  MCH 27.0 27.1 26.4  MCHC 33.3 33.4 32.1  RDW 14.4 14.2 14.4  PLT 240 214 241   HS Trop: 8 > 14  Cardiac EnzymesNo results for input(s):  TROPONINI in the last 168 hours. No results for input(s): TROPIPOC in the last 168 hours.   BNPNo results for input(s): BNP, PROBNP in the last 168 hours.   DDimer No results for input(s): DDIMER in the last 168 hours.   Lipid Panel     Component Value Date/Time   CHOL 152 10/26/2020 1540   TRIG 153 (H) 10/26/2020 1540   HDL 32 (L) 10/26/2020 1540   CHOLHDL 4.8 10/26/2020 1540   VLDL 31 10/26/2020 1540   LDLCALC 89 10/26/2020 1540     Radiology    ECHOCARDIOGRAM COMPLETE  Result Date: 10/28/2020    ECHOCARDIOGRAM REPORT   Patient Name:   Jodi Porter Date of Exam: 10/28/2020 Medical Rec #:  [id number]         Height:       62.0 in Accession #:    [id number]        Weight:       225.0 lb Date of Birth:  09-23-1975         BSA:          2.010 m Patient Age:    45 years          BP:           151/95 mmHg Patient Gender: F                 HR:           63 bpm. Exam Location:  Inpatient Procedure: 2D Echo, Cardiac Doppler, Color Doppler and Intracardiac            Opacification Agent Indications:    Acute ischemic heart disease  History:        Patient has no prior history of Echocardiogram examinations.                 Signs/Symptoms:Chest Pain; Risk Factors:Hypertension,                 Dyslipidemia and Current Smoker.  Sonographer:    Clayton Lefort RDCS (AE) Referring Phys: [place]  Sonographer Comments: Suboptimal subcostal window. IMPRESSIONS  1. Left ventricular ejection fraction, by estimation, is 70 to 75%. The left ventricle has hyperdynamic function. The left ventricle has no regional wall motion abnormalities. Left ventricular diastolic parameters are consistent with Grade I diastolic dysfunction (impaired relaxation).  2. Right ventricular systolic function is normal. The right ventricular size is normal.  3. The mitral valve is normal in structure. No evidence of mitral valve regurgitation. No evidence of mitral stenosis.  4. Aortic valve is not well visualized. The increased  transaortic velocity may be secondary in part from hyperdynamic contraction. The aortic valve was not well visualized. Aortic valve regurgitation is not visualized. Moderate aortic valve stenosis. Aortic valve mean gradient measures 18.3 mmHg. Aortic valve Vmax measures 3.03 m/s.  5. The inferior vena cava is normal in size with greater than 50% respiratory variability, suggesting right atrial pressure of 3 mmHg. FINDINGS  Left Ventricle: Left ventricular ejection fraction, by estimation, is 70 to 75%. The left ventricle has hyperdynamic function. The left ventricle has no regional wall motion abnormalities. Definity contrast agent was given IV to delineate the left ventricular endocardial borders. The left ventricular internal cavity size was normal in size. There is no left ventricular hypertrophy. Left ventricular diastolic parameters are consistent with Grade I diastolic dysfunction (impaired relaxation). Right Ventricle: The right ventricular size is normal. No increase in right ventricular wall thickness. Right ventricular systolic function is normal. Left Atrium: Left atrial size was normal in size. Right Atrium: Right atrial size was normal in size. Pericardium: There is no evidence of pericardial effusion. Mitral Valve: The mitral valve is normal in structure. No evidence of mitral valve regurgitation. No evidence of mitral valve stenosis. Tricuspid Valve: The tricuspid valve is normal in structure. Tricuspid valve regurgitation is not demonstrated. No evidence of tricuspid stenosis. Aortic Valve: Aortic valve is not well visualized. The increased transaortic velocity may be secondary in part from hyperdynamic contraction. The aortic valve was not well visualized. Aortic valve regurgitation is not visualized. Moderate aortic stenosis  is present. Aortic valve mean gradient measures 18.3 mmHg. Aortic valve peak gradient measures 36.8 mmHg. Aortic valve area, by VTI measures 2.03 cm. Pulmonic Valve: The  pulmonic valve was normal in structure. Pulmonic valve regurgitation is not visualized. No evidence of pulmonic stenosis. Aorta: The aortic root is normal in size and structure. Venous: The inferior vena cava is normal in size with greater than 50% respiratory variability, suggesting right atrial pressure of 3 mmHg. IAS/Shunts: No atrial level shunt detected by color flow Doppler.  LEFT VENTRICLE PLAX 2D LVIDd:         4.60 cm  Diastology LVIDs:         2.60 cm  LV e' medial:    5.87 cm/s LV PW:         1.70 cm  LV E/e' medial:  16.3 LV IVS:        1.50 cm  LV e' lateral:   8.05 cm/s LVOT diam:     1.90 cm  LV E/e' lateral: 11.9 LV SV:         106 LV SV Index:   53 LVOT Area:     2.84 cm  RIGHT VENTRICLE RV Basal diam:  3.80 cm RV Mid diam:    2.60 cm RV S prime:     14.10 cm/s TAPSE (M-mode): 2.5 cm LEFT ATRIUM             Index       RIGHT ATRIUM  Index LA diam:        2.90 cm 1.44 cm/m  RA Area:     16.50 cm LA Vol (A2C):   48.1 ml 23.93 ml/m RA Volume:   46.70 ml  23.24 ml/m LA Vol (A4C):   33.7 ml 16.77 ml/m LA Biplane Vol: 40.2 ml 20.00 ml/m  AORTIC VALVE AV Area (Vmax):    1.73 cm AV Area (Vmean):   1.73 cm AV Area (VTI):     2.03 cm AV Vmax:           303.33 cm/s AV Vmean:          198.000 cm/s AV VTI:            0.524 m AV Peak Grad:      36.8 mmHg AV Mean Grad:      18.3 mmHg LVOT Vmax:         185.00 cm/s LVOT Vmean:        121.000 cm/s LVOT VTI:          0.375 m LVOT/AV VTI ratio: 0.72  AORTA Ao Root diam: 2.50 cm Ao Asc diam:  2.60 cm MITRAL VALVE MV Area (PHT): 2.21 cm    SHUNTS MV Decel Time: 343 msec    Systemic VTI:  0.38 m MV E velocity: 95.60 cm/s  Systemic Diam: 1.90 cm MV A velocity: 93.60 cm/s MV E/A ratio:  1.02 Candee Furbish MD Electronically signed by Candee Furbish MD Signature Date/Time: 10/28/2020/11:33:32 AM    Final     Cardiac Studies    ECHO 10/29/20 IMPRESSIONS   1. Left ventricular ejection fraction, by estimation, is 70 to 75%. The  left ventricle has  hyperdynamic function. The left ventricle has no  regional wall motion abnormalities. Left ventricular diastolic parameters  are consistent with Grade I diastolic  dysfunction (impaired relaxation).   2. Right ventricular systolic function is normal. The right ventricular  size is normal.   3. The mitral valve is normal in structure. No evidence of mitral valve  regurgitation. No evidence of mitral stenosis.   4. Aortic valve is not well visualized. The increased transaortic  velocity may be secondary in part from hyperdynamic contraction. The  aortic valve was not well visualized. Aortic valve regurgitation is not  visualized. Moderate aortic valve stenosis.  Aortic valve mean gradient measures 18.3 mmHg. Aortic valve Vmax measures  3.03 m/s.   5. The inferior vena cava is normal in size with greater than 50%  respiratory variability, suggesting right atrial pressure of 3 mmHg.    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  Diffuse disease and or scad in the second left ventricular branch of the right coronary.  Suspect transient thrombotic occlusion of the mid or distal RCA lesion.  Remote possibility is coronary artery spasm. Widely patent LAD with diffuse nonobstructive atherosclerosis. Widely patent circumflex with mild proximal and mid plaque. Widely patent left main Normal left ventricular systolic function with EF 65%.  Severe elevation in LVEDP 25 mmHg, compatible with acute on chronic diastolic heart failure.   RECOMMENDATIONS:   Acute coronary syndrome with demonstrated inferior ST elevation related to transient thrombosis with reperfusion or coronary spasm in a patient with risk factors for either process. Loaded with dual antiplatelet therapy (aspirin and Plavix); IV nitroglycerin for least 24 hours; resume IV heparin when appropriate after hemostasis; aggressive risk factor modification with high intensity statin therapy (target LDL less than  55 given high risk  and young age) and multi prolonged antihypertensive regimen (to include calcium channel blocker therapy/beta-blocker therapy/diuretic therapy/ARB). Check hemoglobin A1c, LP(a), hs-CRP.     I Patient Profile     45 y.o. female who has a history of hypertension,    gestational diabetes, hyperlipidemia, and has been experiencing intermittent chest pain over the past month.  She presented yesterday to have acute coronary syndrome and underwent emergent cardiac catheterization..  Assessment & Plan    Day 3 s/p ACS: Catheterization findings reviewed with patient.  Initial plan for medical therapy.  Currently on amlodipine 10 mg, low-dose metoprolol succinate 25 mg daily, losartan 50 mg, HCTZ 12.5 mg.  ECG shows LVH with lateral ST-T abnormality.  2D echo Doppler study was done, not yet interpreted. Now on aspirin/Plavix.  She is now off iv NTG and on isosorbide 60 mg.  Resting pulse in the 80s yesterday metoprolol succinate increased to 50 mg daily, pulse now in 70s.  Hypertension: Long history of hypertension commencing at age 65.  Aim for target blood pressure ideally less than 120/80.  High likelihood for obstructive sleep apnea: Upon further questioning, the patient wakes up almost every hour at night, has frequent nocturia, nonrestorative sleep, and residual daytime sleepiness.  She had oxygen desaturation to 84% while sleeping.  Mallampati scale is 4 and she is morbidly obese.  We will need comprehensive sleep evaluation post hospital discharge, with probable need for CPAP therapy.  4.  Hpokalemia: K 3.5; repleted; 4.1 today  5.  History of tobacco use: Patient states she started smoking in her early 15s.        She did not have cigarettes the week prior to her admission due to pain.  6.   Morbid obesity: BMI 41.2.  Weight loss and increased exercise will be strongly encouraged  7.  Hyperlipidemia: Target LDL less than 70, but aim for less than 55 for more aggressive  treatment; now on atorvastatin 80 mg; LPa 95.4; CRP 8.4  8.  History of gestational diabetes.   Hemoglobin A1c 6.4  9. Aortic stenosis: on echo, 2/6 systolic murmur; mean gradient 18.3 mmHg.   Plan DC today with F/U with Dr. Tamala Julian. . Will schedule for out patient sleep study; I have called Mariann Laster our sleep coordinator to arrange.  Signed, Troy Sine, MD, [facility] 10/29/2020, 9:26 AM

## 2020-10-29 NOTE — Progress Notes (Signed)
D/C instructions reviewed and all questions answered at this time. Pt belongings sent home with husband prior to D/C per pt. Telemetry & PIV removed. Pt transferred to discharge lounge at this time.

## 2020-10-29 NOTE — Discharge Summary (Addendum)
Discharge Summary    Patient ID: Jodi Porter MRN: [id number]; DOB: 1975/10/02  Admit date: 10/26/2020 Discharge date: 10/29/2020  PCP:  Chesley Noon, MD   [facility] Providers Cardiologist:  Sinclair Grooms, MD   Discharge Diagnoses    Principal Problem:   Acute ST elevation myocardial infarction (STEMI) of inferior wall [facility]) Active Problems:   Essential hypertension   Morbid obesity ([place])   Hyperlipidemia, acquired   Acute ST elevation myocardial infarction (STEMI) involving other coronary artery of inferior wall (HCC)   OSA (obstructive sleep apnea)   Type 2 diabetes mellitus with complication, without long-term current use of insulin ([place])  Diagnostic Studies/Procedures    Cath: 10/26/20  CONCLUSIONS Mid and distal eccentric 50 to 60% RCA stenoses.  Diffuse disease and or scad in the second left ventricular branch of the right coronary.  Suspect transient thrombotic occlusion of the mid or distal RCA lesion.  Remote possibility is coronary artery spasm. Widely patent LAD with diffuse nonobstructive atherosclerosis. Widely patent circumflex with mild proximal and mid plaque. Widely patent left main Normal left ventricular systolic function with EF 65%.  Severe elevation in LVEDP 25 mmHg, compatible with acute on chronic diastolic heart failure.   RECOMMENDATIONS:   Acute coronary syndrome with demonstrated inferior ST elevation related to transient thrombosis with reperfusion or coronary spasm in a patient with risk factors for either process. Loaded with dual antiplatelet therapy (aspirin and Plavix); IV nitroglycerin for least 24 hours; resume IV heparin when appropriate after hemostasis; aggressive risk factor modification with high intensity statin therapy (target LDL less than 55 given high risk and young age) and multi prolonged antihypertensive regimen (to include calcium channel blocker therapy/beta-blocker therapy/diuretic therapy/ARB). Check  hemoglobin A1c, LP(a), hs-CRP.  Diagnostic Dominance: Right   Echo: 10/28/20  IMPRESSIONS     1. Left ventricular ejection fraction, by estimation, is 70 to 75%. The  left ventricle has hyperdynamic function. The left ventricle has no  regional wall motion abnormalities. Left ventricular diastolic parameters  are consistent with Grade I diastolic  dysfunction (impaired relaxation).   2. Right ventricular systolic function is normal. The right ventricular  size is normal.   3. The mitral valve is normal in structure. No evidence of mitral valve  regurgitation. No evidence of mitral stenosis.   4. Aortic valve is not well visualized. The increased transaortic  velocity may be secondary in part from hyperdynamic contraction. The  aortic valve was not well visualized. Aortic valve regurgitation is not  visualized. Moderate aortic valve stenosis.  Aortic valve mean gradient measures 18.3 mmHg. Aortic valve Vmax measures  3.03 m/s.   5. The inferior vena cava is normal in size with greater than 50%  respiratory variability, suggesting right atrial pressure of 3 mmHg.   FINDINGS   Left Ventricle: Left ventricular ejection fraction, by estimation, is 70  to 75%. The left ventricle has hyperdynamic function. The left ventricle  has no regional wall motion abnormalities. Definity contrast agent was  given IV to delineate the left  ventricular endocardial borders. The left ventricular internal cavity size  was normal in size. There is no left ventricular hypertrophy. Left  ventricular diastolic parameters are consistent with Grade I diastolic  dysfunction (impaired relaxation).   Right Ventricle: The right ventricular size is normal. No increase in  right ventricular wall thickness. Right ventricular systolic function is  normal.   Left Atrium: Left atrial size was normal in size.   Right Atrium: Right atrial  size was normal in size.   Pericardium: There is no evidence of  pericardial effusion.   Mitral Valve: The mitral valve is normal in structure. No evidence of  mitral valve regurgitation. No evidence of mitral valve stenosis.   Tricuspid Valve: The tricuspid valve is normal in structure. Tricuspid  valve regurgitation is not demonstrated. No evidence of tricuspid  stenosis.   Aortic Valve: Aortic valve is not well visualized. The increased  transaortic velocity may be secondary in part from hyperdynamic  contraction. The aortic valve was not well visualized. Aortic valve  regurgitation is not visualized. Moderate aortic stenosis   is present. Aortic valve mean gradient measures 18.3 mmHg. Aortic valve  peak gradient measures 36.8 mmHg. Aortic valve area, by VTI measures 2.03  cm.   Pulmonic Valve: The pulmonic valve was normal in structure. Pulmonic valve  regurgitation is not visualized. No evidence of pulmonic stenosis.   Aorta: The aortic root is normal in size and structure.   Venous: The inferior vena cava is normal in size with greater than 50%  respiratory variability, suggesting right atrial pressure of 3 mmHg.   IAS/Shunts: No atrial level shunt detected by color flow Doppler.  _____________   History of Present Illness     Jodi Porter is a 45 y.o. female with PMH of HTN, HLD, gestational diabetes who presented to [facility] with chest pain.   Initial ECG was normal at 9:02 AM with the exception of LVH. Subsequent ECG during more severe pain at 9:44 AM revealed 2 mm ST elevation II, II, and AVF.  The monitor that was placed in the emergency department also demonstrated ST elevation during the episode of chest pain.  After speaking with the emergency room physician, Dr. Billy Fischer, STEMI was activated.     She arrived by ambulance from [facility], having 1 severe episode of chest discomfort relieved by sublingual nitroglycerin in route.  ST segment elevation was completely resolved upon arrival.  She reported 1/10 chest  discomfort.  Over the past month she had had episodes lasting 5 to 10 minutes and then resolving.  Occasionally the discomfort was brought on by physical activity/occurred during physical activity but many more occurred at rest.   The patient specifically stated that she had had multiple episodes of recurring chest discomfort lasting less than 10 minutes over the past month.  Such was the occasion when she initially went to an [place] emergency room and was placed in the waiting room and after a couple hours, nothing was done.  She left and went to the [place].    Hospital Course     NSTEMI: cardiac cath noted above with ST felt to be 2/2 transient thrombosis with reperfusion vs coronary spasm. Placed on DAPT with ASA/plavix.  Follow up ECG showed LVH with lateral ST-T abnormality.  Weaned from IV nitro to Imdur 60mg  daily. Seen by CR.  -- Echo showed EF of 70-75% with no rWMA.   Hypertension: Long history of hypertension commencing at age 59. Improved during admission.  -- continue on amlodipine 10 mg, low-dose metoprolol succinate 25 mg daily, losartan 50 mg, HCTZ 12.5 mg at discharge    Concern for OSA: Upon further questioning, the patient wakes up almost every hour at night, has frequent nocturia, nonrestorative sleep, and residual daytime sleepiness.  She had oxygen desaturation to 84% while sleeping during admission.  -- plan for outpatient sleep study, Dr. Claiborne Billings sent message to office stage prior  to discharge    Hypokalemia: Improved to 4.1 prior to discharge    Hx of tobacco use: Patient states she started smoking in her early 11s. Cessation advised   Morbid obesity: BMI 41.2.   -- Weight loss and increased exercise will be strongly encouraged   Hyperlipidemia: Target LDL less than 70, but aim for less than 55 for more aggressive treatment -- started on atorvastatin 80 mg -- FLP/LFTs in 8 weeks    DM:  Hemoglobin A1c 6.4 -- diet and exercise encouraged  --  follow up with PCP regarding ongoing management   Aortic stenosis: on echo, 2/6 systolic murmur; mean gradient 18.3 mmHg.  Patient was seen by Dr. Claiborne Billings and deemed stable for discharge home. Follow up in the office has been arranged. Educated by PharmD prior to discharge.   Did the patient have an acute coronary syndrome (MI, NSTEMI, STEMI, etc) this admission?:  Yes                               AHA/ACC Clinical Performance & Quality Measures: Aspirin prescribed? - Yes ADP Receptor Inhibitor (Plavix/Clopidogrel, Brilinta/Ticagrelor or Effient/Prasugrel) prescribed (includes medically managed patients)? - Yes Beta Blocker prescribed? - Yes High Intensity Statin (Lipitor 40-80mg  or Crestor 20-40mg ) prescribed? - Yes EF assessed during THIS hospitalization? - Yes For EF <40%, was ACEI/ARB prescribed? - Not Applicable (EF >/= 34%) For EF <40%, Aldosterone Antagonist (Spironolactone or Eplerenone) prescribed? - Not Applicable (EF >/= 19%) Cardiac Rehab Phase II ordered (including medically managed patients)? - Yes      _____________  Discharge Vitals Blood pressure 129/80, pulse 62, temperature 98.8 F (37.1 C), temperature source Oral, resp. rate 15, height 5\' 2"  (1.575 m), weight 102.1 kg, SpO2 96 %.  Filed Weights   10/26/20 1012  Weight: 102.1 kg    Labs & Radiologic Studies    CBC Recent Labs    10/26/20 1540 10/27/20 0158 10/28/20 0052 10/29/20 0102  WBC 5.9   < > 8.2 7.6  NEUTROABS 2.9  --   --   --   HGB 14.1   < > 13.9 14.1  HCT 42.6   < > 41.6 43.9  MCV 81.9   < > 81.1 82.2  PLT 245   < > 214 241   < > = values in this interval not displayed.   Basic Metabolic Panel Recent Labs    10/28/20 0052 10/29/20 0102  NA 133* 132*  K 3.5 4.1  CL 103 104  CO2 20* 19*  GLUCOSE 126* 113*  BUN 14 13  CREATININE 1.14* 1.16*  CALCIUM 9.0 9.2   Liver Function Tests No results for input(s): AST, ALT, ALKPHOS, BILITOT, PROT, ALBUMIN in the last 72 hours. No results  for input(s): LIPASE, AMYLASE in the last 72 hours. High Sensitivity Troponin:   Recent Labs  Lab 10/26/20 0952 10/26/20 1540 10/27/20 0949 10/27/20 1154  TROPONINIHS 8 14 15 16     BNP Invalid input(s): POCBNP D-Dimer No results for input(s): DDIMER in the last 72 hours. Hemoglobin A1C No results for input(s): HGBA1C in the last 72 hours. Fasting Lipid Panel Recent Labs    10/26/20 1540  CHOL 152  HDL 32*  LDLCALC 89  TRIG 153*  CHOLHDL 4.8   Thyroid Function Tests No results for input(s): TSH, T4TOTAL, T3FREE, THYROIDAB in the last 72 hours.  Invalid input(s): FREET3 _____________  CARDIAC CATHETERIZATION  Result Date: 10/26/2020 CONCLUSIONS Mid  and distal eccentric 50 to 60% RCA stenoses.  Diffuse disease and or scad in the second left ventricular branch of the right coronary.  Suspect transient thrombotic occlusion of the mid or distal RCA lesion.  Remote possibility is coronary artery spasm. Widely patent LAD with diffuse nonobstructive atherosclerosis. Widely patent circumflex with mild proximal and mid plaque. Widely patent left main Normal left ventricular systolic function with EF 65%.  Severe elevation in LVEDP 25 mmHg, compatible with acute on chronic diastolic heart failure. RECOMMENDATIONS: Acute coronary syndrome with demonstrated inferior ST elevation related to transient thrombosis with reperfusion or coronary spasm in a patient with risk factors for either process. Loaded with dual antiplatelet therapy (aspirin and Plavix); IV nitroglycerin for least 24 hours; resume IV heparin when appropriate after hemostasis; aggressive risk factor modification with high intensity statin therapy (target LDL less than 55 given high risk and young age) and multi prolonged antihypertensive regimen (to include calcium channel blocker therapy/beta-blocker therapy/diuretic therapy/ARB). Check hemoglobin A1c, LP(a), hs-CRP.   DG Chest Port 1 View  Result Date: 10/26/2020 CLINICAL  DATA:  cp chest pain EXAM: PORTABLE CHEST 1 VIEW COMPARISON:  October 21, 2020, March twelfth 2020 FINDINGS: The cardiomediastinal silhouette is unchanged in contour. No pleural effusion. No pneumothorax. No acute pleuroparenchymal abnormality. Visualized abdomen is unremarkable. Lucency of the RIGHT humeral head, likely degenerative in etiology. IMPRESSION: No acute cardiopulmonary abnormality. Electronically Signed   By: Valentino Saxon M.D.   On: 10/26/2020 10:09   ECHOCARDIOGRAM COMPLETE  Result Date: 10/28/2020    ECHOCARDIOGRAM REPORT   Patient Name:   Jodi Porter Correia Date of Exam: 10/28/2020 Medical Rec #:  [id number]         Height:       62.0 in Accession #:    [id number]        Weight:       225.0 lb Date of Birth:  03/16/1975         BSA:          2.010 m Patient Age:    25 years          BP:           151/95 mmHg Patient Gender: F                 HR:           63 bpm. Exam Location:  Inpatient Procedure: 2D Echo, Cardiac Doppler, Color Doppler and Intracardiac            Opacification Agent Indications:    Acute ischemic heart disease  History:        Patient has no prior history of Echocardiogram examinations.                 Signs/Symptoms:Chest Pain; Risk Factors:Hypertension,                 Dyslipidemia and Current Smoker.  Sonographer:    Clayton Lefort RDCS (AE) Referring Phys: [place]  Sonographer Comments: Suboptimal subcostal window. IMPRESSIONS  1. Left ventricular ejection fraction, by estimation, is 70 to 75%. The left ventricle has hyperdynamic function. The left ventricle has no regional wall motion abnormalities. Left ventricular diastolic parameters are consistent with Grade I diastolic dysfunction (impaired relaxation).  2. Right ventricular systolic function is normal. The right ventricular size is normal.  3. The mitral valve is normal in structure. No evidence of mitral valve regurgitation. No evidence of mitral stenosis.  4. Aortic valve  is not well visualized. The  increased transaortic velocity may be secondary in part from hyperdynamic contraction. The aortic valve was not well visualized. Aortic valve regurgitation is not visualized. Moderate aortic valve stenosis. Aortic valve mean gradient measures 18.3 mmHg. Aortic valve Vmax measures 3.03 m/s.  5. The inferior vena cava is normal in size with greater than 50% respiratory variability, suggesting right atrial pressure of 3 mmHg. FINDINGS  Left Ventricle: Left ventricular ejection fraction, by estimation, is 70 to 75%. The left ventricle has hyperdynamic function. The left ventricle has no regional wall motion abnormalities. Definity contrast agent was given IV to delineate the left ventricular endocardial borders. The left ventricular internal cavity size was normal in size. There is no left ventricular hypertrophy. Left ventricular diastolic parameters are consistent with Grade I diastolic dysfunction (impaired relaxation). Right Ventricle: The right ventricular size is normal. No increase in right ventricular wall thickness. Right ventricular systolic function is normal. Left Atrium: Left atrial size was normal in size. Right Atrium: Right atrial size was normal in size. Pericardium: There is no evidence of pericardial effusion. Mitral Valve: The mitral valve is normal in structure. No evidence of mitral valve regurgitation. No evidence of mitral valve stenosis. Tricuspid Valve: The tricuspid valve is normal in structure. Tricuspid valve regurgitation is not demonstrated. No evidence of tricuspid stenosis. Aortic Valve: Aortic valve is not well visualized. The increased transaortic velocity may be secondary in part from hyperdynamic contraction. The aortic valve was not well visualized. Aortic valve regurgitation is not visualized. Moderate aortic stenosis  is present. Aortic valve mean gradient measures 18.3 mmHg. Aortic valve peak gradient measures 36.8 mmHg. Aortic valve area, by VTI measures 2.03 cm. Pulmonic  Valve: The pulmonic valve was normal in structure. Pulmonic valve regurgitation is not visualized. No evidence of pulmonic stenosis. Aorta: The aortic root is normal in size and structure. Venous: The inferior vena cava is normal in size with greater than 50% respiratory variability, suggesting right atrial pressure of 3 mmHg. IAS/Shunts: No atrial level shunt detected by color flow Doppler.  LEFT VENTRICLE PLAX 2D LVIDd:         4.60 cm  Diastology LVIDs:         2.60 cm  LV e' medial:    5.87 cm/s LV PW:         1.70 cm  LV E/e' medial:  16.3 LV IVS:        1.50 cm  LV e' lateral:   8.05 cm/s LVOT diam:     1.90 cm  LV E/e' lateral: 11.9 LV SV:         106 LV SV Index:   53 LVOT Area:     2.84 cm  RIGHT VENTRICLE RV Basal diam:  3.80 cm RV Mid diam:    2.60 cm RV S prime:     14.10 cm/s TAPSE (M-mode): 2.5 cm LEFT ATRIUM             Index       RIGHT ATRIUM           Index LA diam:        2.90 cm 1.44 cm/m  RA Area:     16.50 cm LA Vol (A2C):   48.1 ml 23.93 ml/m RA Volume:   46.70 ml  23.24 ml/m LA Vol (A4C):   33.7 ml 16.77 ml/m LA Biplane Vol: 40.2 ml 20.00 ml/m  AORTIC VALVE AV Area (Vmax):    1.73 cm AV Area (Vmean):  1.73 cm AV Area (VTI):     2.03 cm AV Vmax:           303.33 cm/s AV Vmean:          198.000 cm/s AV VTI:            0.524 m AV Peak Grad:      36.8 mmHg AV Mean Grad:      18.3 mmHg LVOT Vmax:         185.00 cm/s LVOT Vmean:        121.000 cm/s LVOT VTI:          0.375 m LVOT/AV VTI ratio: 0.72  AORTA Ao Root diam: 2.50 cm Ao Asc diam:  2.60 cm MITRAL VALVE MV Area (PHT): 2.21 cm    SHUNTS MV Decel Time: 343 msec    Systemic VTI:  0.38 m MV E velocity: 95.60 cm/s  Systemic Diam: 1.90 cm MV A velocity: 93.60 cm/s MV E/A ratio:  1.02 Candee Furbish MD Electronically signed by Candee Furbish MD Signature Date/Time: 10/28/2020/11:33:32 AM    Final    Disposition   Pt is being discharged home today in good condition.  Follow-up Plans & Appointments     Follow-up Information      Isaiah Serge, NP Follow up on 11/21/2020.   Specialties: Cardiology, Radiology Why: at 2:15pm for your follow up appt with Dr. Thompson Caul NP Contact information: [place] [address] [phone]         Chesley Noon, MD Follow up.   Specialty: Family Medicine Why: Please follow up with your PCP within 2-3 weeks regarding diabetes management. Contact information: [place] [address] [phone]                Discharge Instructions     Call MD for:  difficulty breathing, headache or visual disturbances   Complete by: As directed    Call MD for:  persistant dizziness or light-headedness   Complete by: As directed    Call MD for:  redness, tenderness, or signs of infection (pain, swelling, redness, odor or green/yellow discharge around incision site)   Complete by: As directed    Diet - low sodium heart healthy   Complete by: As directed    Discharge instructions   Complete by: As directed    Radial Site Care Refer to this sheet in the next few weeks. These instructions provide you with information on caring for yourself after your procedure. Your caregiver may also give you more specific instructions. Your treatment has been planned according to current medical practices, but problems sometimes occur. Call your caregiver if you have any problems or questions after your procedure. HOME CARE INSTRUCTIONS You may shower the day after the procedure. Remove the bandage (dressing) and gently wash the site with plain soap and water. Gently pat the site dry.  Do not apply powder or lotion to the site.  Do not submerge the affected site in water for 3 to 5 days.  Inspect the site at least twice daily.  Do not flex or bend the affected arm for 24 hours.  No lifting over 5 pounds (2.3 kg) for 5 days after your procedure.  Do not drive home if you are discharged the same day of the procedure. Have someone else drive you.  You may  drive 24 hours after the procedure unless otherwise instructed by your caregiver.  What to expect: Any  bruising will usually fade within 1 to 2 weeks.  Blood that collects in the tissue (hematoma) may be painful to the touch. It should usually decrease in size and tenderness within 1 to 2 weeks.  SEEK IMMEDIATE MEDICAL CARE IF: You have unusual pain at the radial site.  You have redness, warmth, swelling, or pain at the radial site.  You have drainage (other than a small amount of blood on the dressing).  You have chills.  You have a fever or persistent symptoms for more than 72 hours.  You have a fever and your symptoms suddenly get worse.  Your arm becomes pale, cool, tingly, or numb.  You have heavy bleeding from the site. Hold pressure on the site.   Increase activity slowly   Complete by: As directed        Discharge Medications   Allergies as of 10/29/2020       Reactions   Penicillins Rash   Tramadol    Shake and get nauseated        Medication List     STOP taking these medications    cloNIDine 0.1 MG tablet Commonly known as: CATAPRES   hydrochlorothiazide 25 MG tablet Commonly known as: HYDRODIURIL Replaced by: hydrochlorothiazide 12.5 MG capsule   HYDROcodone-acetaminophen 5-325 MG tablet Commonly known as: NORCO/VICODIN   lisinopril 20 MG tablet Commonly known as: ZESTRIL   lisinopril-hydrochlorothiazide 20-25 MG tablet Commonly known as: ZESTORETIC   methocarbamol 750 MG tablet Commonly known as: ROBAXIN   metoprolol tartrate 25 MG tablet Commonly known as: LOPRESSOR       TAKE these medications    amLODipine 10 MG tablet Commonly known as: NORVASC Take 1 tablet (10 mg total) by mouth daily. Start taking on: October 30, 2020 What changed: Another medication with the same name was removed. Continue taking this medication, and follow the directions you see here.   aspirin 81 MG chewable tablet Chew 1 tablet (81 mg total) by mouth  daily. Start taking on: October 30, 2020   atorvastatin 80 MG tablet Commonly known as: LIPITOR Take 1 tablet (80 mg total) by mouth daily. Start taking on: October 30, 2020   clopidogrel 75 MG tablet Commonly known as: PLAVIX Take 1 tablet (75 mg total) by mouth daily with breakfast. Start taking on: October 30, 2020   hydrochlorothiazide 12.5 MG capsule Commonly known as: MICROZIDE Take 1 capsule (12.5 mg total) by mouth daily. Start taking on: October 30, 2020 Replaces: hydrochlorothiazide 25 MG tablet   isosorbide mononitrate 60 MG 24 hr tablet Commonly known as: IMDUR Take 1 tablet (60 mg total) by mouth daily. Start taking on: October 30, 2020   losartan 50 MG tablet Commonly known as: COZAAR Take 1 tablet (50 mg total) by mouth daily. Start taking on: October 30, 2020   metoprolol succinate 50 MG 24 hr tablet Commonly known as: TOPROL-XL Take 1 tablet (50 mg total) by mouth daily. Take with or immediately following a meal. Start taking on: October 30, 2020   nitroGLYCERIN 0.4 MG SL tablet Commonly known as: Nitrostat Place 1 tablet (0.4 mg total) under the tongue every 5 (five) minutes as needed.       Outstanding Labs/Studies   FLP/LFTs in 8 weeks   Duration of Discharge Encounter   Greater than 30 minutes including physician time.  Signed, Reino Bellis, NP 10/29/2020, 1:11 PM

## 2020-10-31 ENCOUNTER — Telehealth: Payer: Self-pay | Admitting: *Deleted

## 2020-10-31 NOTE — Telephone Encounter (Signed)
Attempted to speak with patient about potential enrollment into [facility] clinical trial.  Patient was busy at the moment.  Patient asked RN to call back after 930.  Return call placed to patient at [facility] with no answer and no way to leave voicemail  Jasmine Pang, [place]: [phone]

## 2020-11-04 ENCOUNTER — Encounter: Payer: Self-pay | Admitting: *Deleted

## 2020-11-04 DIAGNOSIS — Z006 Encounter for examination for normal comparison and control in clinical research program: Secondary | ICD-10-CM

## 2020-11-04 NOTE — Research (Signed)
Spoke with patient about [facility]-[employer] research study. She is very interested but hasn't had time to look over the consent. Told her no problem when she had time to review to let us know and we would get her scheduled to come in that she is very interested in participating.

## 2020-11-14 ENCOUNTER — Telehealth: Payer: Self-pay | Admitting: *Deleted

## 2020-11-14 DIAGNOSIS — Z006 Encounter for examination for normal comparison and control in clinical research program: Secondary | ICD-10-CM

## 2020-11-14 NOTE — Telephone Encounter (Signed)
Spoke with patient on Monday 10/10 and she stated that she was starting a new job this week and would be in orientation all week to follow up on Friday.    Patient called 10/14 patient still hasnt received her schedule yet, patient instructed to call once she gets her new schedule and if I have not heard anything by Friday 10/21 to call her back

## 2020-11-17 ENCOUNTER — Inpatient Hospital Stay (HOSPITAL_COMMUNITY)
Admission: EM | Admit: 2020-11-17 | Discharge: 2020-11-18 | DRG: 280 | Disposition: A | Discharge status: Home / Self Care | Source: Ambulatory Visit | Attending: Interventional Cardiology | Admitting: Interventional Cardiology

## 2020-11-17 ENCOUNTER — Telehealth: Payer: Self-pay | Admitting: Cardiology

## 2020-11-17 ENCOUNTER — Encounter: Payer: Self-pay | Admitting: Interventional Cardiology

## 2020-11-17 ENCOUNTER — Other Ambulatory Visit: Payer: Self-pay

## 2020-11-17 ENCOUNTER — Telehealth: Payer: Self-pay | Admitting: Interventional Cardiology

## 2020-11-17 ENCOUNTER — Ambulatory Visit (INDEPENDENT_AMBULATORY_CARE_PROVIDER_SITE_OTHER): Admitting: Interventional Cardiology

## 2020-11-17 VITALS — BP 160/108 | HR 57 | Wt 225.8 lb

## 2020-11-17 DIAGNOSIS — I110 Hypertensive heart disease with heart failure: Secondary | ICD-10-CM | POA: Diagnosis present

## 2020-11-17 DIAGNOSIS — Z7982 Long term (current) use of aspirin: Secondary | ICD-10-CM | POA: Diagnosis not present

## 2020-11-17 DIAGNOSIS — I2119 ST elevation (STEMI) myocardial infarction involving other coronary artery of inferior wall: Secondary | ICD-10-CM

## 2020-11-17 DIAGNOSIS — I25111 Atherosclerotic heart disease of native coronary artery with angina pectoris with documented spasm: Principal | ICD-10-CM | POA: Diagnosis present

## 2020-11-17 DIAGNOSIS — E118 Type 2 diabetes mellitus with unspecified complications: Secondary | ICD-10-CM | POA: Diagnosis present

## 2020-11-17 DIAGNOSIS — I252 Old myocardial infarction: Secondary | ICD-10-CM | POA: Diagnosis not present

## 2020-11-17 DIAGNOSIS — I10 Essential (primary) hypertension: Secondary | ICD-10-CM | POA: Diagnosis not present

## 2020-11-17 DIAGNOSIS — E876 Hypokalemia: Secondary | ICD-10-CM | POA: Diagnosis present

## 2020-11-17 DIAGNOSIS — Z8632 Personal history of gestational diabetes: Secondary | ICD-10-CM | POA: Diagnosis not present

## 2020-11-17 DIAGNOSIS — E785 Hyperlipidemia, unspecified: Secondary | ICD-10-CM | POA: Diagnosis present

## 2020-11-17 DIAGNOSIS — Z885 Allergy status to narcotic agent status: Secondary | ICD-10-CM

## 2020-11-17 DIAGNOSIS — Z79899 Other long term (current) drug therapy: Secondary | ICD-10-CM | POA: Diagnosis not present

## 2020-11-17 DIAGNOSIS — I25110 Atherosclerotic heart disease of native coronary artery with unstable angina pectoris: Secondary | ICD-10-CM | POA: Diagnosis present

## 2020-11-17 DIAGNOSIS — F17210 Nicotine dependence, cigarettes, uncomplicated: Secondary | ICD-10-CM | POA: Diagnosis present

## 2020-11-17 DIAGNOSIS — G4733 Obstructive sleep apnea (adult) (pediatric): Secondary | ICD-10-CM | POA: Diagnosis present

## 2020-11-17 DIAGNOSIS — R079 Chest pain, unspecified: Secondary | ICD-10-CM

## 2020-11-17 DIAGNOSIS — U071 COVID-19: Secondary | ICD-10-CM | POA: Diagnosis present

## 2020-11-17 DIAGNOSIS — I201 Angina pectoris with documented spasm: Secondary | ICD-10-CM | POA: Diagnosis not present

## 2020-11-17 DIAGNOSIS — I200 Unstable angina: Secondary | ICD-10-CM | POA: Diagnosis present

## 2020-11-17 DIAGNOSIS — Z7902 Long term (current) use of antithrombotics/antiplatelets: Secondary | ICD-10-CM | POA: Diagnosis not present

## 2020-11-17 DIAGNOSIS — I5033 Acute on chronic diastolic (congestive) heart failure: Secondary | ICD-10-CM | POA: Diagnosis present

## 2020-11-17 DIAGNOSIS — Z880 Allergy status to penicillin: Secondary | ICD-10-CM | POA: Diagnosis not present

## 2020-11-17 DIAGNOSIS — Z6841 Body Mass Index (BMI) 40.0 and over, adult: Secondary | ICD-10-CM | POA: Diagnosis not present

## 2020-11-17 LAB — BASIC METABOLIC PANEL
Anion gap: 9 (ref 5–15)
BUN: 8 mg/dL (ref 6–20)
CO2: 28 mmol/L (ref 22–32)
Calcium: 9.4 mg/dL (ref 8.9–10.3)
Chloride: 101 mmol/L (ref 98–111)
Creatinine, Ser: 1.23 mg/dL — ABNORMAL HIGH (ref 0.44–1.00)
GFR, Estimated: 55 mL/min — ABNORMAL LOW (ref >60)
Glucose, Bld: 97 mg/dL (ref 70–99)
Potassium: 3.5 mmol/L (ref 3.5–5.1)
Sodium: 138 mmol/L (ref 135–145)

## 2020-11-17 LAB — CBC
HCT: 41.7 % (ref 36.0–46.0)
Hemoglobin: 13.5 g/dL (ref 12.0–15.0)
MCH: 26.7 pg (ref 26.0–34.0)
MCHC: 32.4 g/dL (ref 30.0–36.0)
MCV: 82.6 fL (ref 80.0–100.0)
Platelets: 255 10*3/uL (ref 150–400)
RBC: 5.05 MIL/uL (ref 3.87–5.11)
RDW: 14.4 % (ref 11.5–15.5)
WBC: 7.4 10*3/uL (ref 4.0–10.5)
nRBC: 0 % (ref 0.0–0.2)

## 2020-11-17 LAB — CBG MONITORING, ED: Glucose-Capillary: 95 mg/dL (ref 70–99)

## 2020-11-17 LAB — RESP PANEL BY RT-PCR (FLU A&B, COVID) ARPGX2
Influenza A by PCR: NEGATIVE
Influenza B by PCR: NEGATIVE
SARS Coronavirus 2 by RT PCR: POSITIVE — AB

## 2020-11-17 LAB — TROPONIN I (HIGH SENSITIVITY): Troponin I (High Sensitivity): 8 ng/L (ref <18)

## 2020-11-17 LAB — APTT: aPTT: 30 seconds (ref 24–36)

## 2020-11-17 LAB — PROTIME-INR
INR: 1 (ref 0.8–1.2)
Prothrombin Time: 13 seconds (ref 11.4–15.2)

## 2020-11-17 LAB — I-STAT BETA HCG BLOOD, ED (MC, WL, AP ONLY): I-stat hCG, quantitative: <5 m[IU]/mL (ref <5)

## 2020-11-17 MED ORDER — METOPROLOL SUCCINATE ER 25 MG PO TB24: 50.0000 mg | ORAL_TABLET | Freq: Every day | ORAL | Status: DC

## 2020-11-17 MED ORDER — CLOPIDOGREL BISULFATE 75 MG PO TABS
75.0000 mg | ORAL_TABLET | Freq: Every day | ORAL | Status: DC
  Administered 2020-11-18: 75 mg via ORAL
  Filled 2020-11-17: qty 1

## 2020-11-17 MED ORDER — ISOSORBIDE MONONITRATE ER 30 MG PO TB24: 60.0000 mg | ORAL_TABLET | Freq: Every day | ORAL | Status: DC

## 2020-11-17 MED ORDER — SODIUM CHLORIDE 0.9% FLUSH
3.0000 mL | Freq: Two times a day (BID) | INTRAVENOUS | Status: DC
  Administered 2020-11-17: 3 mL via INTRAVENOUS

## 2020-11-17 MED ORDER — SODIUM CHLORIDE 0.9 % IV SOLN: 250.0000 mL | INTRAVENOUS | Status: DC | PRN

## 2020-11-17 MED ORDER — SODIUM CHLORIDE 0.9 % WEIGHT BASED INFUSION: 3.0000 mL/kg/h | INTRAVENOUS | Status: AC

## 2020-11-17 MED ORDER — METOPROLOL SUCCINATE ER 50 MG PO TB24
50.0000 mg | ORAL_TABLET | Freq: Every day | ORAL | Status: DC
  Administered 2020-11-17 – 2020-11-18 (×2): 50 mg via ORAL
  Filled 2020-11-17: qty 1
  Filled 2020-11-17: qty 2

## 2020-11-17 MED ORDER — SODIUM CHLORIDE 0.9% FLUSH: 3.0000 mL | INTRAVENOUS | Status: DC | PRN

## 2020-11-17 MED ORDER — ACETAMINOPHEN 325 MG PO TABS: 650.0000 mg | ORAL_TABLET | ORAL | Status: DC | PRN

## 2020-11-17 MED ORDER — INSULIN ASPART 100 UNIT/ML IJ SOLN: 0.0000 [IU] | Freq: Three times a day (TID) | INTRAMUSCULAR | Status: DC

## 2020-11-17 MED ORDER — HYDRALAZINE HCL 25 MG PO TABS
25.0000 mg | ORAL_TABLET | Freq: Three times a day (TID) | ORAL | Status: DC
  Administered 2020-11-17 – 2020-11-18 (×3): 25 mg via ORAL
  Filled 2020-11-17 (×4): qty 1

## 2020-11-17 MED ORDER — AMLODIPINE BESYLATE 5 MG PO TABS: 10.0000 mg | ORAL_TABLET | Freq: Every day | ORAL | Status: DC

## 2020-11-17 MED ORDER — LOSARTAN POTASSIUM 50 MG PO TABS: 50.0000 mg | ORAL_TABLET | Freq: Every day | ORAL | Status: DC

## 2020-11-17 MED ORDER — ATORVASTATIN CALCIUM 40 MG PO TABS: 80.0000 mg | ORAL_TABLET | Freq: Every day | ORAL | Status: DC

## 2020-11-17 MED ORDER — AMLODIPINE BESYLATE 10 MG PO TABS
10.0000 mg | ORAL_TABLET | Freq: Every day | ORAL | Status: DC
  Administered 2020-11-17 – 2020-11-18 (×2): 10 mg via ORAL
  Filled 2020-11-17: qty 2
  Filled 2020-11-17: qty 1

## 2020-11-17 MED ORDER — ATORVASTATIN CALCIUM 80 MG PO TABS
80.0000 mg | ORAL_TABLET | Freq: Every day | ORAL | Status: DC
  Administered 2020-11-18: 80 mg via ORAL
  Filled 2020-11-17 (×2): qty 1

## 2020-11-17 MED ORDER — LOSARTAN POTASSIUM 50 MG PO TABS
50.0000 mg | ORAL_TABLET | Freq: Every day | ORAL | Status: DC
  Filled 2020-11-17: qty 1

## 2020-11-17 MED ORDER — ASPIRIN EC 81 MG PO TBEC
81.0000 mg | DELAYED_RELEASE_TABLET | Freq: Every day | ORAL | Status: DC
  Administered 2020-11-18: 81 mg via ORAL
  Filled 2020-11-17: qty 1

## 2020-11-17 MED ORDER — ISOSORBIDE MONONITRATE ER 60 MG PO TB24
60.0000 mg | ORAL_TABLET | Freq: Every day | ORAL | Status: DC
  Administered 2020-11-18: 60 mg via ORAL
  Filled 2020-11-17: qty 1

## 2020-11-17 MED ORDER — SODIUM CHLORIDE 0.9 % WEIGHT BASED INFUSION: 1.0000 mL/kg/h | INTRAVENOUS | Status: DC

## 2020-11-17 MED ORDER — ASPIRIN 81 MG PO CHEW: 81.0000 mg | CHEWABLE_TABLET | ORAL | Status: DC

## 2020-11-17 MED ORDER — ONDANSETRON HCL 4 MG/2ML IJ SOLN: 4.0000 mg | Freq: Four times a day (QID) | INTRAMUSCULAR | Status: DC | PRN

## 2020-11-17 MED ORDER — HEPARIN BOLUS VIA INFUSION
4000.0000 [IU] | Freq: Once | INTRAVENOUS | Status: AC
  Administered 2020-11-17: 4000 [IU] via INTRAVENOUS
  Filled 2020-11-17: qty 4000

## 2020-11-17 MED ORDER — HEPARIN (PORCINE) 25000 UT/250ML-% IV SOLN
1300.0000 [IU]/h | INTRAVENOUS | Status: DC
  Administered 2020-11-17: 1000 [IU]/h via INTRAVENOUS
  Administered 2020-11-18: 1300 [IU]/h via INTRAVENOUS
  Filled 2020-11-17 (×2): qty 250

## 2020-11-17 MED ORDER — NITROGLYCERIN 0.4 MG SL SUBL
0.4000 mg | SUBLINGUAL_TABLET | SUBLINGUAL | Status: DC | PRN
  Administered 2020-11-18: 0.4 mg via SUBLINGUAL
  Filled 2020-11-17 (×3): qty 1

## 2020-11-17 NOTE — Patient Instructions (Signed)
Medication Instructions:  Your physician recommends that you continue on your current medications as directed. Please refer to the Current Medication list given to you today.  *If you need a refill on your cardiac medications before your next appointment, please call your pharmacy*   Lab Work: None If you have labs (blood work) drawn today and your tests are completely normal, you will receive your results only by: [place] (if you have MyChart) OR A paper copy in the mail If you have any lab test that is abnormal or we need to change your treatment, we will call you to review the results.   Testing/Procedures: None   Follow-Up:  Will be based on hospital course   Other Instructions

## 2020-11-17 NOTE — H&P (Addendum)
The patient has been seen in conjunction with Fabian Sharp, PAC. All aspects of care have been considered and discussed. The patient has been personally interviewed, examined, and all clinical data has been reviewed.  The patient was seen in the office today with a history disturbing for accelerating angina pectoris.  She is known to me from a presentation approximately 3 weeks ago with ST elevation in inferior leads that quickly responded to sublingual nitroglycerin and coronary angiography demonstrating a 50% mid RCA stenosis and 60% distal stenosis.  Over the past 10 days she has used greater than 20 nitroglycerin tablets for recurring angina.  She has no other complaints.  She denies shortness of breath, orthopnea, cough, chills, fever, palpitations, and syncope. Cardiac exam does not reveal diastolic murmur.  There is a soft 1/6 to 2/6 systolic murmur right upper sternal border.  Lungs are clear.  Extremities reveal no edema.  Radial pulses are 2+ and symmetric. EKG reveals prominent voltage and no acute ST-T change. The patient was transported by EMS to [facility] with accelerating angina with episodes occurring at rest and responsive to sublingual nitroglycerin.  She is on near maximal medical therapy including a long-acting nitrate, beta-blocker, amlodipine, along with preventive therapy in the form of aspirin and Plavix as well as high intensity statin therapy. Patient will be admitted to the hospital.  She should undergo repeat coronary angiography.  Hemodynamic assessment of the right coronary lesion should be performed.  Stenting should occur if appropriate.  Addendum: Patient tested positive for COVID-19.       Cardiology Admission History and Physical:   Patient ID: Jodi Porter MRN: [id number]; DOB: 27-Aug-1975   Admission date: 11/17/2020  PCP:  Chesley Noon, MD   [facility] Providers Cardiologist:  Sinclair Grooms, MD   Chief Complaint:  unstable  angina  Patient Profile:   Jodi Porter is a 45 y.o. female with CAD, hx of gestational DM, DM2, OSA, tobacco use, HTN, and HLD who is being seen 11/17/2020 for the evaluation of unstable angina.  History of Present Illness:   Jodi Porter was recently seen in the for inferior STEMI 10/2020. Heart cath 10/26/20 with nonobstructive CAD without intervention. STEMI felt secondary to transient thrombosis with reperfusion vs coronary spasm.  Medical treatment was recommended and included DAPT with ASA and plavix, amlodipine, and BB. She was seen in the clinic with Dr. Tamala Julian this morning and reported ongoing chest pain responsive to nitro. Medical therapy toprol, imdur, and norvasc, as well as lipitor. Suspected OSA - will need a sleep study. Given her ongoing nitro-responsive chest pain, she was transported from the clinic via EMS for repeat heart cathterization.   During my exam, she describes taking nitro at about 9:30 this morning along with 324 mg ASA. She currently is not in chest pain. She took all morning medications this mornign.    Past Medical History:  Diagnosis Date   Gestational diabetes    Gestational diabetes    Hypertension     Past Surgical History:  Procedure Laterality Date   CORONARY/GRAFT ACUTE MI REVASCULARIZATION N/A 10/26/2020   Procedure: Coronary/Graft Acute MI Revascularization;  Surgeon: Belva Crome, MD;  Location: [place] [facility];  Service: Cardiovascular;  Laterality: N/A;   ECTOPIC PREGNANCY SURGERY     LEFT HEART CATH AND CORONARY ANGIOGRAPHY N/A 10/26/2020   Procedure: LEFT HEART CATH AND CORONARY ANGIOGRAPHY;  Surgeon: Belva Crome, MD;  Location: [place] [facility];  Service: Cardiovascular;  Laterality: N/A;     Medications Prior to Admission: Prior to Admission medications   Medication Sig Start Date End Date Taking? Authorizing Provider  amLODipine (NORVASC) 10 MG tablet Take 1 tablet (10 mg total) by mouth daily. 10/30/20   Cheryln Manly,  NP  aspirin 81 MG chewable tablet Chew 1 tablet (81 mg total) by mouth daily. 10/30/20   Cheryln Manly, NP  atorvastatin (LIPITOR) 80 MG tablet Take 1 tablet (80 mg total) by mouth daily. 10/30/20   Cheryln Manly, NP  clopidogrel (PLAVIX) 75 MG tablet Take 1 tablet (75 mg total) by mouth daily with breakfast. 10/30/20   Cheryln Manly, NP  hydrochlorothiazide (MICROZIDE) 12.5 MG capsule Take 1 capsule (12.5 mg total) by mouth daily. 10/30/20   Cheryln Manly, NP  isosorbide mononitrate (IMDUR) 60 MG 24 hr tablet Take 1 tablet (60 mg total) by mouth daily. 10/30/20   Cheryln Manly, NP  losartan (COZAAR) 50 MG tablet Take 1 tablet (50 mg total) by mouth daily. 10/30/20   Cheryln Manly, NP  metoprolol succinate (TOPROL-XL) 50 MG 24 hr tablet Take 1 tablet (50 mg total) by mouth daily. Take with or immediately following a meal. 10/30/20   Reino Bellis B, NP  nitroGLYCERIN (NITROSTAT) 0.4 MG SL tablet Place 1 tablet (0.4 mg total) under the tongue every 5 (five) minutes as needed. 10/29/20   Cheryln Manly, NP     Allergies:    Allergies  Allergen Reactions   Penicillins Rash   Tramadol     Shake and get nauseated    Social History:   Social History   Socioeconomic History   Marital status: Single    Spouse name: Not on file   Number of children: Not on file   Years of education: Not on file   Highest education level: Not on file  Occupational History   Not on file  Tobacco Use   Smoking status: Every Day    Packs/day: 0.50    Types: Cigarettes   Smokeless tobacco: Never  Substance and Sexual Activity   Alcohol use: No   Drug use: No   Sexual activity: Yes    Birth control/protection: I.U.D.  Other Topics Concern   Not on file  Social History Narrative   Not on file   Social Determinants of Health   Financial Resource Strain: Not on file  Food Insecurity: Not on file  Transportation Needs: Not on file  Physical Activity: Not on file  Stress:  Not on file  Social Connections: Not on file  Intimate Partner Violence: Not on file    Family History:   The patient's family history is not on file.    ROS:  Please see the history of present illness.  All other ROS reviewed and negative.     Physical Exam/Data:   Vitals:   11/17/20 1301 11/17/20 1306 11/17/20 1309  BP:   (!) 163/74  Pulse:   60  Resp:   18  Temp:   99.5 F (37.5 C)  TempSrc:   Oral  SpO2: 99%  100%  Weight:  102.4 kg   Height:  5\' 2"  (1.575 m)    No intake or output data in the 24 hours ending 11/17/20 1355 Last 3 Weights 11/17/2020 11/17/2020 10/26/2020  Weight (lbs) 225 lb 12.8 oz 225 lb 12.8 oz 225 lb  Weight (kg) 102.422 kg 102.422 kg 102.059 kg  Body mass index is 41.3 kg/m.  General:  Well nourished, well developed, in no acute distress HEENT: normal Neck: no JVD Vascular: No carotid bruits; Distal pulses 2+ bilaterally   Cardiac:  normal S1, S2; RRR; soft systolic murmur Lungs:  clear to auscultation bilaterally, no wheezing, rhonchi or rales  Abd: soft, nontender, no hepatomegaly  Ext: no edema Musculoskeletal:  No deformities, BUE and BLE strength normal and equal Skin: warm and dry  Neuro:  CNs 2-12 intact, no focal abnormalities noted Psych:  Normal affect    EKG:  The ECG that was done was personally reviewed and demonstrates sinus  bradycardia with HR 56   Relevant CV Studies:  Echo 10/28/20:  1. Left ventricular ejection fraction, by estimation, is 70 to 75%. The  left ventricle has hyperdynamic function. The left ventricle has no  regional wall motion abnormalities. Left ventricular diastolic parameters  are consistent with Grade I diastolic  dysfunction (impaired relaxation).   2. Right ventricular systolic function is normal. The right ventricular  size is normal.   3. The mitral valve is normal in structure. No evidence of mitral valve  regurgitation. No evidence of mitral stenosis.   4. Aortic valve is not well  visualized. The increased transaortic  velocity may be secondary in part from hyperdynamic contraction. The  aortic valve was not well visualized. Aortic valve regurgitation is not  visualized. Moderate aortic valve stenosis.  Aortic valve mean gradient measures 18.3 mmHg. Aortic valve Vmax measures  3.03 m/s.   5. The inferior vena cava is normal in size with greater than 50%  respiratory variability, suggesting right atrial pressure of 3 mmHg.    Left heart cath 10/26/20: CONCLUSIONS Mid and distal eccentric 50 to 60% RCA stenoses.  Diffuse disease and or scad in the second left ventricular branch of the right coronary.  Suspect transient thrombotic occlusion of the mid or distal RCA lesion.  Remote possibility is coronary artery spasm. Widely patent LAD with diffuse nonobstructive atherosclerosis. Widely patent circumflex with mild proximal and mid plaque. Widely patent left main Normal left ventricular systolic function with EF 65%.  Severe elevation in LVEDP 25 mmHg, compatible with acute on chronic diastolic heart failure.   RECOMMENDATIONS:   Acute coronary syndrome with demonstrated inferior ST elevation related to transient thrombosis with reperfusion or coronary spasm in a patient with risk factors for either process. Loaded with dual antiplatelet therapy (aspirin and Plavix); IV nitroglycerin for least 24 hours; resume IV heparin when appropriate after hemostasis; aggressive risk factor modification with high intensity statin therapy (target LDL less than 55 given high risk and young age) and multi prolonged antihypertensive regimen (to include calcium channel blocker therapy/beta-blocker therapy/diuretic therapy/ARB). Check hemoglobin A1c, LP(a), hs-CRP.  Laboratory Data:  High Sensitivity Troponin:   Recent Labs  Lab 10/26/20 0952 10/26/20 1540 10/27/20 0949 10/27/20 1154  TROPONINIHS 8 14 15 16       ChemistryNo results for input(s): NA, K, CL, CO2, GLUCOSE, BUN,  CREATININE, CALCIUM, MG, GFRNONAA, GFRAA, ANIONGAP in the last 168 hours.  No results for input(s): PROT, ALBUMIN, AST, ALT, ALKPHOS, BILITOT in the last 168 hours. Lipids No results for input(s): CHOL, TRIG, HDL, LABVLDL, LDLCALC, CHOLHDL in the last 168 hours. HematologyNo results for input(s): WBC, RBC, HGB, HCT, MCV, MCH, MCHC, RDW, PLT in the last 168 hours. Thyroid No results for input(s): TSH, FREET4 in the last 168 hours. BNPNo results for input(s): BNP, PROBNP in the last 168 hours.  DDimer No results  for input(s): DDIMER in the last 168 hours.   Radiology/Studies:  No results found.   Assessment and Plan:   Unstable angina Nonobstructive coronary artery disease - pt continues to require nitro for chest pain - failed current medical therapy of BB, norvasc, and imdur - seen in the office today and recommended heart catheterization today.  - start heparin gtt - she is currently chest pain free after nitro at 9:30 am - she took 324 mg ASA prior to arrival   Hypertension - initial BP was 163/74 - has taken home medications - continue to monitor prior to angiography   DM2 - A1c 6.4% - SSI while here   Hyperlipidemia with LDL goal < 70 10/26/2020: Cholesterol 152; HDL 32; LDL Cholesterol 89; Triglycerides 153; VLDL 31 - continue statin   Suspected OSA - will need OP sleep study    Risk Assessment/Risk Scores:    HEAR Score (for undifferentiated chest pain):  HEAR Score: 3      Severity of Illness: The appropriate patient status for this patient is INPATIENT. Inpatient status is judged to be reasonable and necessary in order to provide the required intensity of service to ensure the patient's safety. The patient's presenting symptoms, physical exam findings, and initial radiographic and laboratory data in the context of their chronic comorbidities is felt to place them at high risk for further clinical deterioration. Furthermore, it is not anticipated that the  patient will be medically stable for discharge from the hospital within 2 midnights of admission.   * I certify that at the point of admission it is my clinical judgment that the patient will require inpatient hospital care spanning beyond 2 midnights from the point of admission due to high intensity of service, high risk for further deterioration and high frequency of surveillance required.*   For questions or updates, please contact [place] Please consult [email or web address] for contact info under     Signed, Ledora Bottcher, PA  11/17/2020 1:55 PM

## 2020-11-17 NOTE — Telephone Encounter (Signed)
New Message:   Pt was admitted to [facility] on 10-26-20 for chest pains, this past Saturday(11-15-20) she had to go to [facility] again.- She is still having chest pains. And wants to be seen today,she also needs a note before she can return to work.    Pt c/o of Chest Pain: STAT if CP now or developed within 24 hours  1. Are you having CP right now? yes  2. Are you experiencing any other symptoms (ex. SOB, nausea, vomiting, sweating)? yes  3. How long have you been experiencing CP?   4. Is your CP continuous or coming and going?  Comes and goes  5. Have you taken Nitroglycerin? yes ?

## 2020-11-17 NOTE — ED Provider Notes (Signed)
[facility] EMERGENCY DEPARTMENT Provider Note   CSN: [id number] Arrival date & time: 11/17/20  1255     History Chief Complaint  Patient presents with   Cardiology Consult    Jodi Porter is a 45 y.o. female.  45 year old female with prior medical history as detailed presents for evaluation.  Patient is sent by EMS from her cardiologist office.  Patient's primary cardiology team is concerned about symptoms suggestive of unstable angina.  Patient is comfortable on arrival.  She denies any current chest pain to this provider.  Patient with plan for likely cardiac catheterization this afternoon.  The history is provided by the patient.  Chest Pain Pain location:  Substernal area Pain quality: aching   Pain severity:  No pain Onset quality:  Gradual Timing:  Sporadic   Past Medical History:  Diagnosis Date   Gestational diabetes    Gestational diabetes    Hypertension     Patient Active Problem List   Diagnosis Date Noted   Unstable angina ([place]) 11/17/2020   OSA (obstructive sleep apnea) 10/29/2020   Type 2 diabetes mellitus with complication, without long-term current use of insulin ([place]) 10/29/2020   Acute ST elevation myocardial infarction (STEMI) of inferior wall ([place]) 10/26/2020   Acute ST elevation myocardial infarction (STEMI) involving other coronary artery of inferior wall ([place]) 10/26/2020   Chest pain 06/13/2018   Essential hypertension 06/13/2018   Morbid obesity ([place]) 06/13/2018   Hyperlipidemia, acquired 06/13/2018    Past Surgical History:  Procedure Laterality Date   CORONARY/GRAFT ACUTE MI REVASCULARIZATION N/A 10/26/2020   Procedure: Coronary/Graft Acute MI Revascularization;  Surgeon: Belva Crome, MD;  Location: [place] [facility];  Service: Cardiovascular;  Laterality: N/A;   ECTOPIC PREGNANCY SURGERY     LEFT HEART CATH AND CORONARY ANGIOGRAPHY N/A 10/26/2020   Procedure: LEFT HEART CATH AND CORONARY ANGIOGRAPHY;   Surgeon: Belva Crome, MD;  Location: [place] [facility];  Service: Cardiovascular;  Laterality: N/A;     OB History     Gravida  2   Para      Term      Preterm      AB      Living         SAB      IAB      Ectopic      Multiple      Live Births              No family history on file.  Social History   Tobacco Use   Smoking status: Every Day    Packs/day: 0.50    Types: Cigarettes   Smokeless tobacco: Never  Substance Use Topics   Alcohol use: No   Drug use: No    Home Medications Prior to Admission medications   Medication Sig Start Date End Date Taking? Authorizing Provider  amLODipine (NORVASC) 10 MG tablet Take 1 tablet (10 mg total) by mouth daily. 10/30/20   Cheryln Manly, NP  aspirin 81 MG chewable tablet Chew 1 tablet (81 mg total) by mouth daily. 10/30/20   Cheryln Manly, NP  atorvastatin (LIPITOR) 80 MG tablet Take 1 tablet (80 mg total) by mouth daily. 10/30/20   Cheryln Manly, NP  clopidogrel (PLAVIX) 75 MG tablet Take 1 tablet (75 mg total) by mouth daily with breakfast. 10/30/20   Cheryln Manly, NP  hydrochlorothiazide (MICROZIDE) 12.5 MG capsule Take 1 capsule (12.5 mg total) by mouth daily.  10/30/20   Cheryln Manly, NP  isosorbide mononitrate (IMDUR) 60 MG 24 hr tablet Take 1 tablet (60 mg total) by mouth daily. 10/30/20   Cheryln Manly, NP  losartan (COZAAR) 50 MG tablet Take 1 tablet (50 mg total) by mouth daily. 10/30/20   Cheryln Manly, NP  metoprolol succinate (TOPROL-XL) 50 MG 24 hr tablet Take 1 tablet (50 mg total) by mouth daily. Take with or immediately following a meal. 10/30/20   Reino Bellis B, NP  nitroGLYCERIN (NITROSTAT) 0.4 MG SL tablet Place 1 tablet (0.4 mg total) under the tongue every 5 (five) minutes as needed. 10/29/20   Cheryln Manly, NP    Allergies    Penicillins and Tramadol  Review of Systems   Review of Systems  Cardiovascular:  Positive for chest pain.  All other  systems reviewed and are negative.  Physical Exam Updated Vital Signs BP (!) 163/74 (BP Location: Right Arm)   Pulse 60   Temp 99.5 F (37.5 C) (Oral)   Resp 18   Ht 5\' 2"  (1.575 m)   Wt 102.4 kg   SpO2 100%   BMI 41.30 kg/m   Physical Exam Vitals and nursing note reviewed.  Constitutional:      General: She is not in acute distress.    Appearance: Normal appearance. She is well-developed.  HENT:     Head: Normocephalic and atraumatic.  Eyes:     Conjunctiva/sclera: Conjunctivae normal.     Pupils: Pupils are equal, round, and reactive to light.  Cardiovascular:     Rate and Rhythm: Normal rate and regular rhythm.     Heart sounds: Normal heart sounds.  Pulmonary:     Effort: Pulmonary effort is normal. No respiratory distress.     Breath sounds: Normal breath sounds.  Abdominal:     General: There is no distension.     Palpations: Abdomen is soft.     Tenderness: There is no abdominal tenderness.  Musculoskeletal:        General: No deformity. Normal range of motion.     Cervical back: Normal range of motion and neck supple.  Skin:    General: Skin is warm and dry.  Neurological:     General: No focal deficit present.     Mental Status: She is alert and oriented to person, place, and time.    ED Results / Procedures / Treatments   Labs (all labs ordered are listed, but only abnormal results are displayed) Labs Reviewed  RESP PANEL BY RT-PCR (FLU A&B, COVID) ARPGX2  BASIC METABOLIC PANEL  CBC  APTT  PROTIME-INR  HEPARIN LEVEL (UNFRACTIONATED)  I-STAT BETA HCG BLOOD, ED ([facility], [facility], [facility] ONLY)  TROPONIN I (HIGH SENSITIVITY)    EKG None  Radiology No results found.  Procedures Procedures   Medications Ordered in ED Medications  sodium chloride flush (NS) 0.9 % injection 3 mL (3 mLs Intravenous Given 11/17/20 1346)  sodium chloride flush (NS) 0.9 % injection 3 mL (has no administration in time range)  0.9 %  sodium chloride infusion (has no  administration in time range)  aspirin chewable tablet 81 mg (has no administration in time range)  0.9% sodium chloride infusion (has no administration in time range)    Followed by  0.9% sodium chloride infusion (has no administration in time range)  heparin bolus via infusion 4,000 Units (has no administration in time range)  heparin ADULT infusion 100 units/mL (25000 units/263mL) (has no administration in time  range)    ED Course  I have reviewed the triage vital signs and the nursing notes.  Pertinent labs & imaging results that were available during my care of the patient were reviewed by me and considered in my medical decision making (see chart for details).    MDM Rules/Calculators/A&P    CRITICAL CARE Performed by: Valarie Merino   Total critical care time: 40 minutes  Critical care time was exclusive of separately billable procedures and treating other patients.  Critical care was necessary to treat or prevent imminent or life-threatening deterioration.  Critical care was time spent personally by me on the following activities: development of treatment plan with patient and/or surrogate as well as nursing, discussions with consultants, evaluation of patient's response to treatment, examination of patient, obtaining history from patient or surrogate, ordering and performing treatments and interventions, ordering and review of laboratory studies, ordering and review of radiographic studies, pulse oximetry and re-evaluation of patient's condition.   MDM  MSE complete  FLORESTINE CARMICAL was evaluated in Emergency Department on 11/17/2020 for the symptoms described in the history of present illness. She was evaluated in the context of the global COVID-19 pandemic, which necessitated consideration that the patient might be at risk for infection with the SARS-CoV-2 virus that causes COVID-19. Institutional protocols and algorithms that pertain to the evaluation of patients at  risk for COVID-19 are in a state of rapid change based on information released by regulatory bodies including the CDC and federal and state organizations. These policies and algorithms were followed during the patient's care in the ED.   Patient sent from cardiology office.  Patient with plan for cardiac catheterization this afternoon.  Patiently is currently comfortable.  Cardiology team is aware of case and has plan for catheterization this afternoon.    Final Clinical Impression(s) / ED Diagnoses Final diagnoses:  Chest pain, unspecified type    Rx / DC Orders ED Discharge Orders     None        Valarie Merino, MD 11/17/20 1400

## 2020-11-17 NOTE — Progress Notes (Signed)
ANTICOAGULATION CONSULT NOTE - Initial Consult  Pharmacy Consult for heparin Indication: chest pain/ACS  Allergies  Allergen Reactions   Penicillins Rash   Tramadol     Shake and get nauseated    Patient Measurements: Height: 5\' 2"  (157.5 cm) Weight: 102.4 kg (225 lb 12.8 oz) IBW/kg (Calculated) : 50.1 Heparin Dosing Weight: 74.6 kg  Vital Signs: Temp: 99.5 F (37.5 C) (10/17 1309) Temp Source: Oral (10/17 1309) BP: 163/74 (10/17 1309) Pulse Rate: 60 (10/17 1309)  Labs: No results for input(s): HGB, HCT, PLT, APTT, LABPROT, INR, HEPARINUNFRC, HEPRLOWMOCWT, CREATININE, CKTOTAL, CKMB, TROPONINIHS in the last 72 hours.  Estimated Creatinine Clearance: 68.6 mL/min (A) (by C-G formula based on SCr of 1.16 mg/dL (H)).   Medical History: Past Medical History:  Diagnosis Date   Gestational diabetes    Gestational diabetes    Hypertension     Medications: see MAR  Assessment: 45 yo F with unstable angina, sent from cardioloy office for cath. Heparin consult for anticoagulation. No AC PTA. Took ASA 324mg  x1 dose this AM.   Goal of Therapy:  Heparin level 0.3-0.7 units/ml Monitor platelets by anticoagulation protocol: Yes   Plan:  Give 4000 units bolus x 1 Start heparin infusion at 1000 units/hr Check anti-Xa level in 6 hours and daily while on heparin Continue to monitor H&H and platelets  Joetta Manners, PharmD, [facility] Emergency Medicine Clinical Pharmacist ED RPh Phone: [place]: [phone]

## 2020-11-17 NOTE — Progress Notes (Signed)
Cardiology Office Note:    Date:  11/17/2020   ID:  Jodi Porter, DOB [birth year], MRN [id number]  PCP:  Chesley Noon, MD  Cardiologist:  Sinclair Grooms, MD   Referring MD: Chesley Noon, MD   Chief Complaint  Patient presents with   Coronary Artery Disease    Unstable angina pectoris   Hypertension    History of Present Illness:    Jodi Porter is a 45 y.o. female with a hx of gestational diabetes, diabetes mellitus type 2, obstructive sleep apnea, hyperlipidemia, and CAD with recent aborted inferior myocardial infarction.  Coronary angiography listed below demonstrated 50% mid and 60% distal RCA.  Suspected ruptured plaque with thrombus resolution.  She has not had a follow-up since discharge from the hospital on 10/29/2020.  She was discharged with plans for aggressive risk factor modification and blood pressure control.  She comes in today with elevated blood pressure, history of angina with minimal activity since discharge from the hospital and requiring multiple sublingual nitroglycerin tablets since discharge.  She is used up to 20 nitroglycerin tablets since September 28.  Episodes build up over the course of 1 to 2 minutes.  She will take a nitro and it resolves within 5 minutes.  On several occasions she has needed to use at least 2 nitroglycerin to get the discomfort relieved.  She went to the emergency room at [facility] yesterday because of recurring severe chest pain but was not found to have EKG changes and was discharged.  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  States compliance with therapy and has had all medications today.  Past Medical History:  Diagnosis Date   Gestational diabetes    Gestational diabetes    Hypertension     Past Surgical History:  Procedure Laterality Date   CORONARY/GRAFT ACUTE MI REVASCULARIZATION N/A 10/26/2020   Procedure: Coronary/Graft Acute MI  Revascularization;  Surgeon: Belva Crome, MD;  Location: [place] [facility];  Service: Cardiovascular;  Laterality: N/A;   ECTOPIC PREGNANCY SURGERY     LEFT HEART CATH AND CORONARY ANGIOGRAPHY N/A 10/26/2020   Procedure: LEFT HEART CATH AND CORONARY ANGIOGRAPHY;  Surgeon: Belva Crome, MD;  Location: [place] [facility];  Service: Cardiovascular;  Laterality: N/A;    Current Medications: Current Meds  Medication Sig   amLODipine (NORVASC) 10 MG tablet Take 1 tablet (10 mg total) by mouth daily.   aspirin 81 MG chewable tablet Chew 1 tablet (81 mg total) by mouth daily.   atorvastatin (LIPITOR) 80 MG tablet Take 1 tablet (80 mg total) by mouth daily.   clopidogrel (PLAVIX) 75 MG tablet Take 1 tablet (75 mg total) by mouth daily with breakfast.   hydrochlorothiazide (MICROZIDE) 12.5 MG capsule Take 1 capsule (12.5 mg total) by mouth daily.   isosorbide mononitrate (IMDUR) 60 MG 24 hr tablet Take 1 tablet (60 mg total) by mouth daily.   losartan (COZAAR) 50 MG tablet Take 1 tablet (50 mg total) by mouth daily.   metoprolol succinate (TOPROL-XL) 50 MG 24 hr tablet Take 1 tablet (50 mg total) by mouth daily. Take with or immediately following a meal.   nitroGLYCERIN (NITROSTAT) 0.4 MG SL tablet Place 1 tablet (0.4 mg total) under the tongue every 5 (five) minutes as needed.     Allergies:   Penicillins and Tramadol   Social History   Socioeconomic History   Marital status: Single  Spouse name: Not on file   Number of children: Not on file   Years of education: Not on file   Highest education level: Not on file  Occupational History   Not on file  Tobacco Use   Smoking status: Every Day    Packs/day: 0.50    Types: Cigarettes   Smokeless tobacco: Never  Substance and Sexual Activity   Alcohol use: No   Drug use: No   Sexual activity: Yes    Birth control/protection: I.U.D.  Other Topics Concern   Not on file  Social History Narrative   Not on file   Social Determinants  of Health   Financial Resource Strain: Not on file  Food Insecurity: Not on file  Transportation Needs: Not on file  Physical Activity: Not on file  Stress: Not on file  Social Connections: Not on file     Family History: The patient's family history is not on file.  ROS:   Please see the history of present illness.    No orthopnea.  Physical activity provokes the chest discomfort.  All other systems reviewed and are negative.  EKGs/Labs/Other Studies Reviewed:    The following studies were reviewed today: Coronary angiography 10/26/2020:\  CONCLUSIONS Mid and distal eccentric 50 to 60% RCA stenoses.  Diffuse disease and or scad in the second left ventricular branch of the right coronary.  Suspect transient thrombotic occlusion of the mid or distal RCA lesion.  Remote possibility is coronary artery spasm. Widely patent LAD with diffuse nonobstructive atherosclerosis. Widely patent circumflex with mild proximal and mid plaque. Widely patent left main Normal left ventricular systolic function with EF 65%.  Severe elevation in LVEDP 25 mmHg, compatible with acute on chronic diastolic heart failure.   RECOMMENDATIONS:   Acute coronary syndrome with demonstrated inferior ST elevation related to transient thrombosis with reperfusion or coronary spasm in a patient with risk factors for either process. Loaded with dual antiplatelet therapy (aspirin and Plavix); IV nitroglycerin for least 24 hours; resume IV heparin when appropriate after hemostasis; aggressive risk factor modification with high intensity statin therapy (target LDL less than 55 given high risk and young age) and multi prolonged antihypertensive regimen (to include calcium channel blocker therapy/beta-blocker therapy/diuretic therapy/ARB). Check hemoglobin A1c, LP(a), hs-CRP.    Coronary Diagrams  Diagnostic Dominance: Right Intervention    2D Doppler echocardiogram 10/28/2020: IMPRESSIONS   1. Left ventricular  ejection fraction, by estimation, is 70 to 75%. The  left ventricle has hyperdynamic function. The left ventricle has no  regional wall motion abnormalities. Left ventricular diastolic parameters  are consistent with Grade I diastolic  dysfunction (impaired relaxation).   2. Right ventricular systolic function is normal. The right ventricular  size is normal.   3. The mitral valve is normal in structure. No evidence of mitral valve  regurgitation. No evidence of mitral stenosis.   4. Aortic valve is not well visualized. The increased transaortic  velocity may be secondary in part from hyperdynamic contraction. The  aortic valve was not well visualized. Aortic valve regurgitation is not  visualized. Moderate aortic valve stenosis.  Aortic valve mean gradient measures 18.3 mmHg. Aortic valve Vmax measures  3.03 m/s.   5. The inferior vena cava is normal in size with greater than 50%  respiratory variability, suggesting right atrial pressure of 3 mmHg.   EKG:  EKG sinus bradycardia, 50 bpm, otherwise normal.  Small Q waves 1 and aVL.  Left axis deviation.  Recent Labs: 10/26/2020: ALT 15  10/29/2020: BUN 13; Creatinine, Ser 1.16; Hemoglobin 14.1; Platelets 241; Potassium 4.1; Sodium 132  Recent Lipid Panel    Component Value Date/Time   CHOL 152 10/26/2020 1540   TRIG 153 (H) 10/26/2020 1540   HDL 32 (L) 10/26/2020 1540   CHOLHDL 4.8 10/26/2020 1540   VLDL 31 10/26/2020 1540   LDLCALC 89 10/26/2020 1540    Physical Exam:    VS:  BP (!) 160/108   Pulse (!) 57   Wt 225 lb 12.8 oz (102.4 kg)   SpO2 99%   BMI 41.30 kg/m     Wt Readings from Last 3 Encounters:  11/17/20 225 lb 12.8 oz (102.4 kg)  10/26/20 225 lb (102.1 kg)  08/23/19 220 lb (99.8 kg)     GEN: Morbid obesity. No acute distress HEENT: Normal NECK: No JVD. LYMPHATICS: No lymphadenopathy CARDIAC: 2/6 systolic murmur. RRR no gallop, or edema. VASCULAR:  Normal Pulses. No bruits. RESPIRATORY:  Clear to  auscultation without rales, wheezing or rhonchi  ABDOMEN: Soft, non-tender, non-distended, No pulsatile mass, MUSCULOSKELETAL: No deformity  SKIN: Warm and dry NEUROLOGIC:  Alert and oriented x 3 PSYCHIATRIC:  Normal affect   ASSESSMENT:    1. Acute ST elevation myocardial infarction (STEMI) of inferior wall (HCC)   2. Essential hypertension   3. OSA (obstructive sleep apnea)   4. Type 2 diabetes mellitus with complication, without long-term current use of insulin ([place])   5. Morbid obesity ([place])   6. Hyperlipidemia, acquired    PLAN:    In order of problems listed above:  Unstable angina pectoris in this female who is having angina very similar to her presenting symptom when she had acute ST segment elevation in late September.  Chest discomfort has been responsive to nitroglycerin as it was then.  EKG today in the office with the patient pain-free does not reveal acute ST-T wave change.  She has failed medical therapy of angina.  She needs repeat cardiac catheterization and probably stenting of the mid and distal RCA.  Also need to look for other evidence of new de novo disease.  She will be discharged from the office via ambulance to the emergency room at [facility].  The cardiac team is been notified.  Cath later today is a possibility.  She is on aggressive medical therapy including Toprol-XL, Imdur, Norvasc, as well as preventive therapy with Lipitor.  Plavix has been use along with aspirin since discharge from the hospital. For BP control.  May need more aggressive diuretic therapy.  If recurrent chest pain, IV nitroglycerin should be started. If not a definite diagnosis, will need to have a sleep study. Consider adding a hemoglobin A1c and if elevated Did not discuss On high intensity statin therapy  The patient was counseled to undergo left heart catheterization, coronary angiography, and possible percutaneous coronary intervention with stent implantation. The procedural  risks and benefits were discussed in detail. The risks discussed included death, stroke, myocardial infarction, life-threatening bleeding, limb ischemia, kidney injury, allergy, and possible emergency cardiac surgery. The risk of these significant complications were estimated to occur less than 1% of the time. After discussion, the patient has agreed to proceed.    Medication Adjustments/Labs and Tests Ordered: Current medicines are reviewed at length with the patient today.  Concerns regarding medicines are outlined above.  Orders Placed This Encounter  Procedures   EKG 12-Lead   No orders of the defined types were placed in this encounter.   Patient Instructions  Medication  Instructions:  Your physician recommends that you continue on your current medications as directed. Please refer to the Current Medication list given to you today.  *If you need a refill on your cardiac medications before your next appointment, please call your pharmacy*   Lab Work: None If you have labs (blood work) drawn today and your tests are completely normal, you will receive your results only by: [place] (if you have MyChart) OR A paper copy in the mail If you have any lab test that is abnormal or we need to change your treatment, we will call you to review the results.   Testing/Procedures: None   Follow-Up:  Will be based on hospital course   Other Instructions      Signed, Sinclair Grooms, MD  11/17/2020 12:42 PM    Echo

## 2020-11-17 NOTE — ED Notes (Signed)
Pt denies CP at his time.

## 2020-11-17 NOTE — ED Triage Notes (Signed)
Pt arrived to ED via EMS from cardiology office. Pt sent to ED d/t cardiology wanting pt to have cardiac cath d/t unstable angina. Pt denies any CP at time of arrival to ED and did not have CP w/ EMS. VSS w/ EMS other than BP of 160/108. Pt in NAD.

## 2020-11-17 NOTE — Telephone Encounter (Signed)
Attempted phone call to pt.  Unable to leave voicemail message as voicemail has not been set up. 

## 2020-11-17 NOTE — Telephone Encounter (Signed)
Pt is calling from the 13th hallway at  [facility] ED with chest pains, pt have been laying in the hallway following her office visit this morning. Pt is still currently having CP, she has self medicated herself since no one is attending to her needs. Pt states she was supposed to have a Cath today and nobody will answer her questions. Pt would like for Dr. Tamala Julian to call the ED and speak with someone about pt having her cath.  Jodi Porter can be reached at [phone]

## 2020-11-17 NOTE — Telephone Encounter (Signed)
Spoke with pt who reports she went to ED on Saturday d/t chest pain that did not resolve with 3 nitro.  Pt states she was admitted 9/25 with STEMI.  She states she is taking medications as prescribed.  She has continued to have intermittent CP throughout the weekend with resolution after taking nitro.  She is requesting a f/u appointment with provider at the recommendation of the ED in [facility]  Appointment scheduled with Dr Tamala Julian today at 1130am.  Reviewed ED precautions with pt.  Pt verbalizes understanding and thanked Therapist, sports for the call.

## 2020-11-18 ENCOUNTER — Encounter (HOSPITAL_COMMUNITY): Payer: Self-pay | Admitting: Interventional Cardiology

## 2020-11-18 ENCOUNTER — Telehealth: Payer: Self-pay | Admitting: Interventional Cardiology

## 2020-11-18 ENCOUNTER — Telehealth (HOSPITAL_COMMUNITY): Payer: Self-pay | Admitting: Pharmacist

## 2020-11-18 ENCOUNTER — Encounter: Payer: Self-pay | Admitting: Physician Assistant

## 2020-11-18 ENCOUNTER — Encounter (HOSPITAL_COMMUNITY)
Admission: EM | Disposition: A | Discharge status: Home / Self Care | Source: Ambulatory Visit | Attending: Interventional Cardiology

## 2020-11-18 DIAGNOSIS — I201 Angina pectoris with documented spasm: Secondary | ICD-10-CM

## 2020-11-18 DIAGNOSIS — I25110 Atherosclerotic heart disease of native coronary artery with unstable angina pectoris: Secondary | ICD-10-CM

## 2020-11-18 DIAGNOSIS — I10 Essential (primary) hypertension: Secondary | ICD-10-CM

## 2020-11-18 DIAGNOSIS — E118 Type 2 diabetes mellitus with unspecified complications: Secondary | ICD-10-CM

## 2020-11-18 DIAGNOSIS — I200 Unstable angina: Secondary | ICD-10-CM | POA: Diagnosis not present

## 2020-11-18 HISTORY — PX: LEFT HEART CATH AND CORONARY ANGIOGRAPHY: CATH118249

## 2020-11-18 HISTORY — PX: CORONARY PRESSURE/FFR STUDY: CATH118243

## 2020-11-18 LAB — BASIC METABOLIC PANEL
Anion gap: 9 (ref 5–15)
BUN: 9 mg/dL (ref 6–20)
CO2: 26 mmol/L (ref 22–32)
Calcium: 9.1 mg/dL (ref 8.9–10.3)
Chloride: 100 mmol/L (ref 98–111)
Creatinine, Ser: 1.21 mg/dL — ABNORMAL HIGH (ref 0.44–1.00)
GFR, Estimated: 56 mL/min — ABNORMAL LOW (ref >60)
Glucose, Bld: 110 mg/dL — ABNORMAL HIGH (ref 70–99)
Potassium: 3.3 mmol/L — ABNORMAL LOW (ref 3.5–5.1)
Sodium: 135 mmol/L (ref 135–145)

## 2020-11-18 LAB — CBC
HCT: 43.3 % (ref 36.0–46.0)
Hemoglobin: 14 g/dL (ref 12.0–15.0)
MCH: 26.5 pg (ref 26.0–34.0)
MCHC: 32.3 g/dL (ref 30.0–36.0)
MCV: 81.9 fL (ref 80.0–100.0)
Platelets: 255 10*3/uL (ref 150–400)
RBC: 5.29 MIL/uL — ABNORMAL HIGH (ref 3.87–5.11)
RDW: 14.3 % (ref 11.5–15.5)
WBC: 7.9 10*3/uL (ref 4.0–10.5)
nRBC: 0 % (ref 0.0–0.2)

## 2020-11-18 LAB — LIPID PANEL
Cholesterol: 128 mg/dL (ref 0–200)
HDL: 34 mg/dL — ABNORMAL LOW (ref >40)
LDL Cholesterol: 75 mg/dL (ref 0–99)
Total CHOL/HDL Ratio: 3.8 RATIO
Triglycerides: 94 mg/dL (ref <150)
VLDL: 19 mg/dL (ref 0–40)

## 2020-11-18 LAB — TROPONIN I (HIGH SENSITIVITY): Troponin I (High Sensitivity): 8 ng/L (ref <18)

## 2020-11-18 LAB — HEPARIN LEVEL (UNFRACTIONATED): Heparin Unfractionated: <0.1 IU/mL — ABNORMAL LOW (ref 0.30–0.70)

## 2020-11-18 LAB — POCT ACTIVATED CLOTTING TIME: Activated Clotting Time: 283 seconds

## 2020-11-18 SURGERY — LEFT HEART CATH AND CORONARY ANGIOGRAPHY
Anesthesia: LOCAL

## 2020-11-18 MED ORDER — NITROGLYCERIN 1 MG/10 ML FOR IR/CATH LAB
INTRA_ARTERIAL | Status: AC
  Filled 2020-11-18: qty 10

## 2020-11-18 MED ORDER — LOSARTAN POTASSIUM 25 MG PO TABS: 25.0000 mg | ORAL_TABLET | Freq: Every day | ORAL | 1 refills | Status: DC

## 2020-11-18 MED ORDER — HEPARIN BOLUS VIA INFUSION
2000.0000 [IU] | Freq: Once | INTRAVENOUS | Status: AC
  Administered 2020-11-18: 2000 [IU] via INTRAVENOUS
  Filled 2020-11-18: qty 2000

## 2020-11-18 MED ORDER — HEPARIN (PORCINE) IN NACL 1000-0.9 UT/500ML-% IV SOLN
INTRAVENOUS | Status: AC
  Filled 2020-11-18: qty 500

## 2020-11-18 MED ORDER — VERAPAMIL HCL 2.5 MG/ML IV SOLN
INTRAVENOUS | Status: DC | PRN
  Administered 2020-11-18: 10 mL via INTRA_ARTERIAL

## 2020-11-18 MED ORDER — ISOSORBIDE MONONITRATE ER 120 MG PO TB24: 120.0000 mg | ORAL_TABLET | Freq: Every day | ORAL | 1 refills | Status: DC

## 2020-11-18 MED ORDER — VERAPAMIL HCL 2.5 MG/ML IV SOLN
INTRAVENOUS | Status: AC
  Filled 2020-11-18: qty 2

## 2020-11-18 MED ORDER — SODIUM CHLORIDE 0.9 % WEIGHT BASED INFUSION
3.0000 mL/kg/h | INTRAVENOUS | Status: AC
  Administered 2020-11-18: 3 mL/kg/h via INTRAVENOUS

## 2020-11-18 MED ORDER — LIDOCAINE HCL (PF) 1 % IJ SOLN
INTRAMUSCULAR | Status: DC | PRN
  Administered 2020-11-18: 2 mL via SUBCUTANEOUS

## 2020-11-18 MED ORDER — POTASSIUM CHLORIDE CRYS ER 20 MEQ PO TBCR
40.0000 meq | EXTENDED_RELEASE_TABLET | Freq: Once | ORAL | Status: AC
  Administered 2020-11-18: 40 meq via ORAL
  Filled 2020-11-18: qty 2

## 2020-11-18 MED ORDER — METOPROLOL SUCCINATE ER 25 MG PO TB24: 25.0000 mg | ORAL_TABLET | Freq: Every day | ORAL | 1 refills | Status: DC

## 2020-11-18 MED ORDER — HEPARIN SODIUM (PORCINE) 1000 UNIT/ML IJ SOLN
INTRAMUSCULAR | Status: DC | PRN
  Administered 2020-11-18: 8000 [IU] via INTRAVENOUS
  Administered 2020-11-18: 2000 [IU] via INTRAVENOUS

## 2020-11-18 MED ORDER — MIDAZOLAM HCL 2 MG/2ML IJ SOLN
INTRAMUSCULAR | Status: AC
  Filled 2020-11-18: qty 2

## 2020-11-18 MED ORDER — HEPARIN BOLUS VIA INFUSION
2000.0000 [IU] | Freq: Once | INTRAVENOUS | Status: DC
  Filled 2020-11-18: qty 2000

## 2020-11-18 MED ORDER — HEPARIN SODIUM (PORCINE) 1000 UNIT/ML IJ SOLN
INTRAMUSCULAR | Status: AC
  Filled 2020-11-18: qty 1

## 2020-11-18 MED ORDER — MIDAZOLAM HCL 2 MG/2ML IJ SOLN
INTRAMUSCULAR | Status: DC | PRN
  Administered 2020-11-18 (×2): 1 mg via INTRAVENOUS

## 2020-11-18 MED ORDER — SODIUM CHLORIDE 0.9 % WEIGHT BASED INFUSION
1.0000 mL/kg/h | INTRAVENOUS | Status: DC
  Administered 2020-11-18: 1 mL/kg/h via INTRAVENOUS

## 2020-11-18 MED ORDER — ISOSORBIDE MONONITRATE ER 60 MG PO TB24: 120.0000 mg | ORAL_TABLET | Freq: Every day | ORAL | Status: DC

## 2020-11-18 MED ORDER — FENTANYL CITRATE (PF) 100 MCG/2ML IJ SOLN
INTRAMUSCULAR | Status: DC | PRN
  Administered 2020-11-18: 50 ug via INTRAVENOUS

## 2020-11-18 MED ORDER — METOPROLOL SUCCINATE ER 25 MG PO TB24: 25.0000 mg | ORAL_TABLET | Freq: Every day | ORAL | Status: DC

## 2020-11-18 MED ORDER — IOHEXOL 350 MG/ML SOLN
INTRAVENOUS | Status: DC | PRN
  Administered 2020-11-18: 120 mL via INTRA_ARTERIAL

## 2020-11-18 MED ORDER — LOSARTAN POTASSIUM 25 MG PO TABS
25.0000 mg | ORAL_TABLET | Freq: Every day | ORAL | Status: DC
  Administered 2020-11-18: 25 mg via ORAL
  Filled 2020-11-18: qty 1

## 2020-11-18 MED ORDER — AMLODIPINE BESYLATE 2.5 MG PO TABS: 2.5000 mg | ORAL_TABLET | Freq: Every day | ORAL | Status: DC

## 2020-11-18 MED ORDER — HEPARIN (PORCINE) IN NACL 1000-0.9 UT/500ML-% IV SOLN
INTRAVENOUS | Status: DC | PRN
  Administered 2020-11-18 (×2): 500 mL

## 2020-11-18 MED ORDER — FENTANYL CITRATE (PF) 100 MCG/2ML IJ SOLN
INTRAMUSCULAR | Status: AC
  Filled 2020-11-18: qty 2

## 2020-11-18 SURGICAL SUPPLY — 14 items
CATH 5FR JL3.5 JR4 ANG PIG MP (CATHETERS) ×2 IMPLANT
CATH LAUNCHER 6FR EBU 3 (CATHETERS) ×2 IMPLANT
CATH VISTA GUIDE 6FR JR4 (CATHETERS) ×2 IMPLANT
DEVICE RAD COMP TR BAND LRG (VASCULAR PRODUCTS) ×2 IMPLANT
GLIDESHEATH SLEND A-KIT 6F 22G (SHEATH) ×2 IMPLANT
GUIDEWIRE INQWIRE 1.5J.035X260 (WIRE) ×1 IMPLANT
GUIDEWIRE PRESSURE X 175 (WIRE) ×2 IMPLANT
INQWIRE 1.5J .035X260CM (WIRE) ×2
KIT ESSENTIALS PG (KITS) ×2 IMPLANT
KIT HEART LEFT (KITS) ×2 IMPLANT
PACK CARDIAC CATHETERIZATION (CUSTOM PROCEDURE TRAY) ×2 IMPLANT
SHEATH PROBE COVER 6X72 (BAG) ×2 IMPLANT
TRANSDUCER W/STOPCOCK (MISCELLANEOUS) ×2 IMPLANT
TUBING CIL FLEX 10 FLL-RA (TUBING) ×2 IMPLANT

## 2020-11-18 NOTE — Telephone Encounter (Signed)
Hello  [name] ,     The Pharmacy team is conducting a discharge transitions of care quality improvement initiative. The recommendations below are for your consideration.    Jodi Porter is a 45 y.o. female (MRN: [id number], DOB: Oct 29, 1975) who was recently hospitalized on 11/17/2020 for unstable angina. They are anticipated to visit your clinic for post-discharge follow-up and may benefit from assistance with medication initiation and/or access.     Their pertinent cardiovascular medications at discharge should include:  amLODipine 10 MG tablet Commonly known as: NORVASC Take 1 tablet (10 mg total) by mouth daily.  aspirin 81 MG chewable tablet Chew 1 tablet (81 mg total) by mouth daily.  atorvastatin 80 MG tablet Commonly known as: LIPITOR Take 1 tablet (80 mg total) by mouth daily  clopidogrel 75 MG tablet Commonly known as: PLAVIX Take 1 tablet (75 mg total) by mouth daily with breakfast.  hydrochlorothiazide 12.5 MG capsule Commonly known as: MICROZIDE Take 1 capsule (12.5 mg total) by mouth daily.  isosorbide mononitrate 120 MG 24 hr tablet Commonly known as: IMDUR Take 1 tablet (120 mg total) by mouth daily.  losartan 25 MG tablet Commonly known as: COZAAR Take 1 tablet (25 mg total) by mouth daily. Start taking on: November 19, 2020 What changed:  medication strength how much to take    metoprolol succinate 25 MG 24 hr tablet Commonly known as: TOPROL-XL Take 1 tablet (25 mg total) by mouth daily.  nitroGLYCERIN 0.4 MG SL tablet Commonly known as: Nitrostat Place 1 tablet (0.4 mg total) under the tongue every 5 (five) minutes as needed.      Pertinent medication changes of note Metoprolol succinate was decreased from 50mg > 25mg  daily due to bradycardia Losartan was added. She was discharged on losartan 50mg  daily on 10/29/20 but she reported that she was not taking this at home  If appropriate, please consider the additional therapy recommendations below:   Titrate losartan dose up as tolerated With pre-diabetes  could consider metformin (based on BMI, age and hx Gestational DM) if dietary measure are not successful Atorvastatin was added 10/26/20. Her LDL was 75 on 11/18/20 (down from 89 on 10/26/20).  Her goal LDL is < 70 and could consider adding Zetia. May need at least another ~ 6 weeks on atorvastatin before rechecking a lipid panel    We appreciate your assistance with the implementation of these recommendations. Please let us know if there is anything we can help you with at this time.       Thank you,   Hildred Laser, PharmD Clinical Pharmacist **Pharmacist phone directory can now be found on [email or web address] ([email or web address]).  Listed under [place].

## 2020-11-18 NOTE — Progress Notes (Signed)
Discharge instructions (including medications) discussed with and copy provided to patient/caregiver 

## 2020-11-18 NOTE — Progress Notes (Addendum)
Progress Note  Patient Name: Jodi Porter Date of Encounter: 11/18/2020  [facility] Cardiologist: Sinclair Grooms, MD   Subjective   Developed recurrent chest pain this morning. Improved to 1/10 at the time of assessment.   Inpatient Medications    Scheduled Meds:  amLODipine  10 mg Oral Daily   aspirin EC  81 mg Oral Daily   atorvastatin  80 mg Oral Daily   clopidogrel  75 mg Oral Q breakfast   hydrALAZINE  25 mg Oral Q8H   isosorbide mononitrate  60 mg Oral Daily   metoprolol succinate  50 mg Oral Daily   sodium chloride flush  3 mL Intravenous Q12H   Continuous Infusions:  sodium chloride     sodium chloride Stopped (11/17/20 1526)   sodium chloride 1 mL/kg/hr (11/18/20 0807)   heparin 1,300 Units/hr (11/18/20 0729)   PRN Meds: sodium chloride, acetaminophen, nitroGLYCERIN, ondansetron (ZOFRAN) IV, sodium chloride flush   Vital Signs    Vitals:   11/17/20 2131 11/17/20 2335 11/18/20 0750 11/18/20 0835  BP: (!) 189/85 (!) 188/84 (!) 147/69 (!) 186/95  Pulse: 66 65 (!) 54   Resp: 20 15 16    Temp: 99.8 F (37.7 C) 99.3 F (37.4 C) 98.2 F (36.8 C)   TempSrc: Oral Oral Oral   SpO2: 100% 100% 100%   Weight:      Height:       No intake or output data in the 24 hours ending 11/18/20 0840 Last 3 Weights 11/17/2020 11/17/2020 10/26/2020  Weight (lbs) 225 lb 12.8 oz 225 lb 12.8 oz 225 lb  Weight (kg) 102.422 kg 102.422 kg 102.059 kg      Telemetry    SB, rates 50s - Personally Reviewed  ECG    SB with new TWI in inferior leads - Personally Reviewed  Physical Exam   GEN: No acute distress.   Neck: No JVD Cardiac: RRR, soft systolic murmurs, no rubs, or gallops.  Respiratory: Clear to auscultation bilaterally. GI: Soft, nontender, non-distended  MS: No edema; No deformity. Neuro:  Nonfocal  Psych: Normal affect   Labs    High Sensitivity Troponin:   Recent Labs  Lab 10/26/20 1540 10/27/20 0949 10/27/20 1154 11/17/20 1332  11/18/20 0154  TROPONINIHS 14 15 16 8 8      Chemistry Recent Labs  Lab 11/17/20 1332 11/18/20 0154  NA 138 135  K 3.5 3.3*  CL 101 100  CO2 28 26  GLUCOSE 97 110*  BUN 8 9  CREATININE 1.23* 1.21*  CALCIUM 9.4 9.1  GFRNONAA 55* 56*  ANIONGAP 9 9    Lipids  Recent Labs  Lab 11/18/20 0154  CHOL 128  TRIG 94  HDL 34*  LDLCALC 75  CHOLHDL 3.8    Hematology Recent Labs  Lab 11/17/20 1332 11/18/20 0154  WBC 7.4 7.9  RBC 5.05 5.29*  HGB 13.5 14.0  HCT 41.7 43.3  MCV 82.6 81.9  MCH 26.7 26.5  MCHC 32.4 32.3  RDW 14.4 14.3  PLT 255 255   Thyroid No results for input(s): TSH, FREET4 in the last 168 hours.  BNPNo results for input(s): BNP, PROBNP in the last 168 hours.  DDimer No results for input(s): DDIMER in the last 168 hours.   Radiology    No results found.  Cardiac Studies   Cath: 10/26/20  CONCLUSIONS Mid and distal eccentric 50 to 60% RCA stenoses.  Diffuse disease and or scad in the second left ventricular branch of  the right coronary.  Suspect transient thrombotic occlusion of the mid or distal RCA lesion.  Remote possibility is coronary artery spasm. Widely patent LAD with diffuse nonobstructive atherosclerosis. Widely patent circumflex with mild proximal and mid plaque. Widely patent left main Normal left ventricular systolic function with EF 65%.  Severe elevation in LVEDP 25 mmHg, compatible with acute on chronic diastolic heart failure.   RECOMMENDATIONS:   Acute coronary syndrome with demonstrated inferior ST elevation related to transient thrombosis with reperfusion or coronary spasm in a patient with risk factors for either process. Loaded with dual antiplatelet therapy (aspirin and Plavix); IV nitroglycerin for least 24 hours; resume IV heparin when appropriate after hemostasis; aggressive risk factor modification with high intensity statin therapy (target LDL less than 55 given high risk and young age) and multi prolonged antihypertensive  regimen (to include calcium channel blocker therapy/beta-blocker therapy/diuretic therapy/ARB). Check hemoglobin A1c, LP(a), hs-CRP.  Diagnostic Dominance: Right  Echo: 10/28/20  IMPRESSIONS     1. Left ventricular ejection fraction, by estimation, is 70 to 75%. The  left ventricle has hyperdynamic function. The left ventricle has no  regional wall motion abnormalities. Left ventricular diastolic parameters  are consistent with Grade I diastolic  dysfunction (impaired relaxation).   2. Right ventricular systolic function is normal. The right ventricular  size is normal.   3. The mitral valve is normal in structure. No evidence of mitral valve  regurgitation. No evidence of mitral stenosis.   4. Aortic valve is not well visualized. The increased transaortic  velocity may be secondary in part from hyperdynamic contraction. The  aortic valve was not well visualized. Aortic valve regurgitation is not  visualized. Moderate aortic valve stenosis.  Aortic valve mean gradient measures 18.3 mmHg. Aortic valve Vmax measures  3.03 m/s.   5. The inferior vena cava is normal in size with greater than 50%  respiratory variability, suggesting right atrial pressure of 3 mmHg.   FINDINGS   Left Ventricle: Left ventricular ejection fraction, by estimation, is 70  to 75%. The left ventricle has hyperdynamic function. The left ventricle  has no regional wall motion abnormalities. Definity contrast agent was  given IV to delineate the left  ventricular endocardial borders. The left ventricular internal cavity size  was normal in size. There is no left ventricular hypertrophy. Left  ventricular diastolic parameters are consistent with Grade I diastolic  dysfunction (impaired relaxation).   Right Ventricle: The right ventricular size is normal. No increase in  right ventricular wall thickness. Right ventricular systolic function is  normal.   Left Atrium: Left atrial size was normal in size.    Right Atrium: Right atrial size was normal in size.   Pericardium: There is no evidence of pericardial effusion.   Mitral Valve: The mitral valve is normal in structure. No evidence of  mitral valve regurgitation. No evidence of mitral valve stenosis.   Tricuspid Valve: The tricuspid valve is normal in structure. Tricuspid  valve regurgitation is not demonstrated. No evidence of tricuspid  stenosis.   Aortic Valve: Aortic valve is not well visualized. The increased  transaortic velocity may be secondary in part from hyperdynamic  contraction. The aortic valve was not well visualized. Aortic valve  regurgitation is not visualized. Moderate aortic stenosis   is present. Aortic valve mean gradient measures 18.3 mmHg. Aortic valve  peak gradient measures 36.8 mmHg. Aortic valve area, by VTI measures 2.03  cm.   Pulmonic Valve: The pulmonic valve was normal in structure. Pulmonic valve  regurgitation is not visualized. No evidence of pulmonic stenosis.   Aorta: The aortic root is normal in size and structure.   Venous: The inferior vena cava is normal in size with greater than 50%  respiratory variability, suggesting right atrial pressure of 3 mmHg.   IAS/Shunts: No atrial level shunt detected by color flow Doppler.   Patient Profile     45 y.o. female with CAD, hx of gestational DM, DM2, OSA, tobacco use, HTN, and HLD who was seen 11/17/2020 for the evaluation of unstable angina.  Assessment & Plan    Unstable Angina: recent admission with moderate nonobstructive disease on cath. Now back recurrent chest pain, sent for admission from the office yesterday. hsTn 8>>8. EKG this morning with new TWI in inferior leads. Had recurrent chest pain, but improving. Planned for cath this morning.  -- on ASA, plavix, Imdur, Toprol, norvasc, statin  HTN: Blood pressures are elevated, just received morning meds. Will follow, adjust as appropriate.  -- norvasc 10mg  daily, Toprol 50mg  daily,  hydralazine 25mg  TID, Imdur 60mg  daily -- plan to resume ARB post cath pending stable renal function   PreDM: Hgb A1c 6.4  HLD: LDL 75 -- Atorvastatin 80mg  daily   Possible OSA: plan for outpatient sleep study  Hypokalemia: K+ 3.3, supplement  For questions or updates, please contact [facility] Please consult [email or web address] for contact info under    Signed, Reino Bellis, NP  11/18/2020, 8:40 AM    I have examined the patient and reviewed assessment and plan and discussed with patient.  Agree with above as stated.  She has had intermittent pain with negative troponins.  Cath today.  Moderate disease on prior cath.  I suspect cath will be ok.  Further plans dependent on cath result.  If no PCI, will try for discharge today.  Covid positive.  Mild sx.  Not sure that she would even qualify for antiviral given lack of symptoms.   Jodi Porter

## 2020-11-18 NOTE — Progress Notes (Signed)
ANTICOAGULATION CONSULT NOTE  Pharmacy Consult for heparin Indication: chest pain/ACS  Allergies  Allergen Reactions   Penicillins Rash   Tramadol     Shake and get nauseated    Patient Measurements: Height: 5\' 2"  (157.5 cm) Weight: 102.4 kg (225 lb 12.8 oz) IBW/kg (Calculated) : 50.1 Heparin Dosing Weight: 74.6 kg  Vital Signs: Temp: 99.3 F (37.4 C) (10/17 2335) Temp Source: Oral (10/17 2335) BP: 188/84 (10/17 2335) Pulse Rate: 65 (10/17 2335)  Labs: Recent Labs    11/17/20 1332 11/18/20 0154  HGB 13.5 14.0  HCT 41.7 43.3  PLT 255 255  APTT 30  --   LABPROT 13.0  --   INR 1.0  --   HEPARINUNFRC  --  <0.10*  CREATININE 1.23*  --   TROPONINIHS 8  --     Estimated Creatinine Clearance: 64.7 mL/min (A) (by C-G formula based on SCr of 1.23 mg/dL (H)).   Medical History: Past Medical History:  Diagnosis Date   Gestational diabetes    Gestational diabetes    Hypertension     Medications: see MAR  Assessment: 45 yo F with unstable angina, sent from cardioloy office for cath. Heparin consult for anticoagulation. No AC PTA. Took ASA 324mg  x1 dose this AM.   Heparin level undetectable on gtt at 1000 units/hr. No issues with line or bleeding reported per RN. Plan for cath today - scheduled for 0900.   Goal of Therapy:  Heparin level 0.3-0.7 units/ml Monitor platelets by anticoagulation protocol: Yes   Plan:  Rebolus heparin IV 2000 units Increase heparin infusion to 1300 units/hr F/u post cath  Sherlon Handing, PharmD, BCPS Please see amion for complete clinical pharmacist phone list 11/18/2020 3:24 AM

## 2020-11-18 NOTE — CV Procedure (Signed)
Diffuse luminal irregularities in RCA with 30 to 40% eccentric mid RCA, 50% concentric distal RCA, 50% ostial to proximal PDA.  RFR beyond the PDA lesion is 0.95.  No intervention is indicated. Left coronary system is widely patent left main, LAD, and there is 30% narrowing of the mid circumflex.  Anatomy is unchanged from a month 3 weeks ago. LVEDP 10 mmHg No intervention performed.  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  No significant epicardial coronary disease is present.  Continue dual antiplatelet therapy.

## 2020-11-18 NOTE — Interval H&P Note (Signed)
Cath Lab Visit (complete for each Cath Lab visit)  Clinical Evaluation Leading to the Procedure:   ACS: Yes.    Non-ACS:    Anginal Classification: CCS IV  Anti-ischemic medical therapy: Maximal Therapy (2 or more classes of medications)  Non-Invasive Test Results: No non-invasive testing performed  Prior CABG: No previous CABG      History and Physical Interval Note:  11/18/2020 10:40 AM  Jodi Porter  has presented today for surgery, with the diagnosis of chest pain.  The various methods of treatment have been discussed with the patient and family. After consideration of risks, benefits and other options for treatment, the patient has consented to  Procedure(s): LEFT HEART CATH AND CORONARY ANGIOGRAPHY (N/A) as a surgical intervention.  The patient's history has been reviewed, patient examined, no change in status, stable for surgery.  I have reviewed the patient's chart and labs.  Questions were answered to the patient's satisfaction.     Jodi Porter

## 2020-11-18 NOTE — Telephone Encounter (Signed)
Patient has TOC with [name] Walker per Reino Bellis.

## 2020-11-18 NOTE — Progress Notes (Signed)
Pt taking BP cuff off. Advised pt that I need to continue to get blood pressures every 15 to 30 minutes while trying to release air out of TR band.

## 2020-11-18 NOTE — Plan of Care (Signed)
  Problem: Education: Goal: Knowledge of General Education information will improve Description: Including pain rating scale, medication(s)/side effects and non-pharmacologic comfort measures Outcome: Adequate for Discharge   

## 2020-11-18 NOTE — Telephone Encounter (Signed)
Hello  Jodi Porter,     The Pharmacy team is conducting a discharge transitions of care quality improvement initiative. The recommendations below are for your consideration.    Jodi Porter is a 45 y.o. female (MRN: [id number], DOB: 06/26/1975) who was recently hospitalized on 11/17/2020 for Unstable angina. They are anticipated to visit your clinic for post-discharge follow-up and may benefit from assistance with medication initiation and/or access.     Their pertinent cardiovascular medications at discharge should include:  amLODipine 10 MG tablet Commonly known as: NORVASC Take 1 tablet (10 mg total) by mouth daily.  aspirin 81 MG chewable tablet Chew 1 tablet (81 mg total) by mouth daily.  atorvastatin 80 MG tablet Commonly known as: LIPITOR Take 1 tablet (80 mg total) by mouth daily.  clopidogrel 75 MG tablet Commonly known as: PLAVIX Take 1 tablet (75 mg total) by mouth daily with breakfast.  hydrochlorothiazide 12.5 MG capsule Commonly known as: MICROZIDE Take 1 capsule (12.5 mg total) by mouth daily.  isosorbide mononitrate 120 MG 24 hr tablet Commonly known as: IMDUR Take 1 tablet (120 mg total) by mouth daily.  losartan 25 MG tablet Commonly known as: COZAAR Take 1 tablet (25 mg total) by mouth daily.  metoprolol succinate 25 MG 24 hr tablet Commonly known as: TOPROL-XL Take 1 tablet (25 mg total) by mouth daily.  nitroGLYCERIN 0.4 MG SL tablet Commonly known as: Nitrostat Place 1 tablet (0.4 mg total) under the tongue every 5 (five) minutes as needed. What changed: reasons to take this    Pertinent medication additions or changes of note: Imdur was increased from 60> 120mg  po daily due to concern of coronary vasospasm.  Metoprolol succinate was decreased from 50mg  > 25mg  daily due to bradycardia Losartan 25mg  daily was added. She was discharged on losartan 50mg  daily on 10/29/20 but she reported that she was not taking this at home  If appropriate, please consider  the additional therapy recommendations below:   Consider further titration of losartan as tolerated LDL was 75 (on 11/18/20; down from 89 on 10/26/20) and atorvastatin was started 10/26/20. Goal LDL < 70, could consider ezetimibe if she is unable to reach goal.  With Pre-Diabetes could consider adding metformin (based on BMI, age and hx Gestational DM) if dietary measures are unsuccessful  We appreciate your assistance with the implementation of these recommendations. Please let us know if there is anything we can help you with at this time.       Thank you, Hildred Laser, PharmD Clinical Pharmacist **Pharmacist phone directory can now be found on [email or web address] ([email or web address]).  Listed under [place].

## 2020-11-18 NOTE — Discharge Summary (Addendum)
Discharge Summary    Patient ID: Jodi Porter MRN: [id number]; DOB: May 19, 1975  Admit date: 11/17/2020 Discharge date: 11/18/2020  PCP:  Chesley Noon, MD   [facility] Providers Cardiologist:  Sinclair Grooms, MD   Discharge Diagnoses    Principal Problem:   Unstable angina [facility]) Active Problems:   Morbid obesity ([place])   Type 2 diabetes mellitus with complication, without long-term current use of insulin ([place])   Prinzmetal's angina ([place])   Primary hypertension    Diagnostic Studies/Procedures    Cath: 11/18/20  CONCLUSIONS: Right coronary contains 40% mid stenosis, 50% distal stenosis, and PDA with 50% ostial to proximal stenosis.  RFR across the stenoses obtained in the mid to distal PDA is 0.95. Widely patent left main Minimal luminal irregularities in the LAD 30% mid circumflex. LVEDP 10 mmHg.   RECOMMENDATIONS:   The patient has clinical features of Prinzmetal's angina.  We will increase isosorbide mononitrate to 120 mg/day.  Decrease beta-blocker therapy to Toprol-XL 25 mg/day (could be exacerbating vasoconstriction and is certainly contributing to significant bradycardia).  Consider adding calcium channel blocker although I prefer angiotensin receptor blocker at this stage given glucose intolerance.  Liberal use of sublingual nitroglycerin as needed as an outpatient. Potential discharge later today.  Diagnostic Dominance: Right  _____________   History of Present Illness     Jodi Porter is a 45 y.o. female with CAD, hx of gestational DM, DM2, OSA, tobacco use, HTN, and HLD who is being seen 11/17/2020 for the evaluation of unstable angina.  Jodi Porter was recently seen in the for inferior STEMI 10/2020. Heart cath 10/26/20 with nonobstructive CAD without intervention. STEMI felt secondary to transient thrombosis with reperfusion vs coronary spasm.  Medical treatment was recommended and included DAPT with ASA and plavix, amlodipine, and BB.  She was seen in the clinic with Dr. Tamala Julian the morning of admission and reported ongoing chest pain responsive to nitro. Medical therapy toprol, imdur, and norvasc, as well as lipitor. Suspected OSA - will need a sleep study. Given her ongoing nitro-responsive chest pain, she was transported from the clinic via EMS for repeat heart cathterization.    During exam, she described taking nitro at about 9:30 that morning along with 324 mg ASA. Given symptoms she was admitted with plans for cardiac cath.   Hospital Course     Chest pain: recent admission with moderate nonobstructive disease on cath.  Sent from the office with active chest pain for admission. hsTn 8>>8. EKG with new TWI in inferior leads.  Underwent cardiac catheterization noted above with Dr. Tamala Julian with diffuse luminal irregularities in the RCA of 40 to 50% in the mid to distal vessel, 50% ostial PDA with RFR noted at 0.95.  No indication for intervention.  Her anatomy was essentially unchanged from prior cath in September.  LVEDP 10 mmHg. it was felt her symptoms were likely due to epicardial coronary spasm with recommendations to intensify nitrate therapy and continue calcium channel blocker.  Likely wean beta-blocker therapy. --Continue on aspirin, Plavix, Imdur increased from 60 mg daily to 120 mg daily, Norvasc 10 mg daily, atorvastatin 80 mg daily metoprolol was reduced from 50 mg daily to 25 mg daily.   HTN: Blood pressures were significantly elevated on admission but improved after receiving meds. --Home meds include Norvasc 10 mg daily, metoprolol 25 mg daily, Imdur 120 mg daily, losartan 25 mg daily, hydrochlorothiazide 12.5 mg daily   PreDM: Hgb A1c 6.4 --She had been previously  taken off of losartan but recommend resumption given her prediabetes.  Restarted on losartan 25 mg daily at discharge --Follow-up with PCP regarding ongoing diabetes monitoring   HLD: LDL 75 -- Atorvastatin 80mg  daily    Possible OSA: plan for  outpatient sleep study   Hypokalemia: K+ 3.3, supplemented prior to discharge  COVID positive: Essentially asymptomatic, no treatment indicated.  Patient was seen by Dr. Irish Lack and deemed stable for discharge home.  Follow-up in the office has been arranged.  Medications sent to patient's pharmacy of choice.  Did the patient have an acute coronary syndrome (MI, NSTEMI, STEMI, etc) this admission?:  No                               Did the patient have a percutaneous coronary intervention (stent / angioplasty)?:  No.      _____________  Discharge Vitals Blood pressure (!) 145/84, pulse (!) 59, temperature 98.4 F (36.9 C), temperature source Oral, resp. rate 14, height 5\' 2"  (1.575 m), weight 102.4 kg, SpO2 100 %.  Filed Weights   11/17/20 1306  Weight: 102.4 kg    Labs & Radiologic Studies    CBC Recent Labs    11/17/20 1332 11/18/20 0154  WBC 7.4 7.9  HGB 13.5 14.0  HCT 41.7 43.3  MCV 82.6 81.9  PLT 255 938   Basic Metabolic Panel Recent Labs    11/17/20 1332 11/18/20 0154  NA 138 135  K 3.5 3.3*  CL 101 100  CO2 28 26  GLUCOSE 97 110*  BUN 8 9  CREATININE 1.23* 1.21*  CALCIUM 9.4 9.1   Liver Function Tests No results for input(s): AST, ALT, ALKPHOS, BILITOT, PROT, ALBUMIN in the last 72 hours. No results for input(s): LIPASE, AMYLASE in the last 72 hours. High Sensitivity Troponin:   Recent Labs  Lab 10/26/20 1540 10/27/20 0949 10/27/20 1154 11/17/20 1332 11/18/20 0154  TROPONINIHS 14 15 16 8 8     BNP Invalid input(s): POCBNP D-Dimer No results for input(s): DDIMER in the last 72 hours. Hemoglobin A1C No results for input(s): HGBA1C in the last 72 hours. Fasting Lipid Panel Recent Labs    11/18/20 0154  CHOL 128  HDL 34*  LDLCALC 75  TRIG 94  CHOLHDL 3.8   Thyroid Function Tests No results for input(s): TSH, T4TOTAL, T3FREE, THYROIDAB in the last 72 hours.  Invalid input(s): FREET3 _____________  CARDIAC CATHETERIZATION  Result  Date: 11/18/2020 CONCLUSIONS: Right coronary contains 40% mid stenosis, 50% distal stenosis, and PDA with 50% ostial to proximal stenosis.  RFR across the stenoses obtained in the mid to distal PDA is 0.95. Widely patent left main Minimal luminal irregularities in the LAD 30% mid circumflex. LVEDP 10 mmHg. RECOMMENDATIONS: The patient has clinical features of Prinzmetal's angina.  We will increase isosorbide mononitrate to 120 mg/day.  Decrease beta-blocker therapy to Toprol-XL 25 mg/day (could be exacerbating vasoconstriction and is certainly contributing to significant bradycardia).  Consider adding calcium channel blocker although I prefer angiotensin receptor blocker at this stage given glucose intolerance.  Liberal use of sublingual nitroglycerin as needed as an outpatient. Potential discharge later today.   CARDIAC CATHETERIZATION  Result Date: 10/26/2020 CONCLUSIONS Mid and distal eccentric 50 to 60% RCA stenoses.  Diffuse disease and or scad in the second left ventricular branch of the right coronary.  Suspect transient thrombotic occlusion of the mid or distal RCA lesion.  Remote possibility is coronary artery  spasm. Widely patent LAD with diffuse nonobstructive atherosclerosis. Widely patent circumflex with mild proximal and mid plaque. Widely patent left main Normal left ventricular systolic function with EF 65%.  Severe elevation in LVEDP 25 mmHg, compatible with acute on chronic diastolic heart failure. RECOMMENDATIONS: Acute coronary syndrome with demonstrated inferior ST elevation related to transient thrombosis with reperfusion or coronary spasm in a patient with risk factors for either process. Loaded with dual antiplatelet therapy (aspirin and Plavix); IV nitroglycerin for least 24 hours; resume IV heparin when appropriate after hemostasis; aggressive risk factor modification with high intensity statin therapy (target LDL less than 55 given high risk and young age) and multi prolonged  antihypertensive regimen (to include calcium channel blocker therapy/beta-blocker therapy/diuretic therapy/ARB). Check hemoglobin A1c, LP(a), hs-CRP.   DG Chest Port 1 View  Result Date: 10/26/2020 CLINICAL DATA:  cp chest pain EXAM: PORTABLE CHEST 1 VIEW COMPARISON:  October 21, 2020, March twelfth 2020 FINDINGS: The cardiomediastinal silhouette is unchanged in contour. No pleural effusion. No pneumothorax. No acute pleuroparenchymal abnormality. Visualized abdomen is unremarkable. Lucency of the RIGHT humeral head, likely degenerative in etiology. IMPRESSION: No acute cardiopulmonary abnormality. Electronically Signed   By: Valentino Saxon M.D.   On: 10/26/2020 10:09   ECHOCARDIOGRAM COMPLETE  Result Date: 10/28/2020    ECHOCARDIOGRAM REPORT   Patient Name:   JELENA MALICOAT Street Date of Exam: 10/28/2020 Medical Rec #:  [id number]         Height:       62.0 in Accession #:    [id number]        Weight:       225.0 lb Date of Birth:  1975-10-07         BSA:          2.010 m Patient Age:    50 years          BP:           151/95 mmHg Patient Gender: F                 HR:           63 bpm. Exam Location:  Inpatient Procedure: 2D Echo, Cardiac Doppler, Color Doppler and Intracardiac            Opacification Agent Indications:    Acute ischemic heart disease  History:        Patient has no prior history of Echocardiogram examinations.                 Signs/Symptoms:Chest Pain; Risk Factors:Hypertension,                 Dyslipidemia and Current Smoker.  Sonographer:    Clayton Lefort RDCS (AE) Referring Phys: [place]  Sonographer Comments: Suboptimal subcostal window. IMPRESSIONS  1. Left ventricular ejection fraction, by estimation, is 70 to 75%. The left ventricle has hyperdynamic function. The left ventricle has no regional wall motion abnormalities. Left ventricular diastolic parameters are consistent with Grade I diastolic dysfunction (impaired relaxation).  2. Right ventricular systolic function is  normal. The right ventricular size is normal.  3. The mitral valve is normal in structure. No evidence of mitral valve regurgitation. No evidence of mitral stenosis.  4. Aortic valve is not well visualized. The increased transaortic velocity may be secondary in part from hyperdynamic contraction. The aortic valve was not well visualized. Aortic valve regurgitation is not visualized. Moderate aortic valve stenosis. Aortic valve mean gradient measures 18.3 mmHg. Aortic  valve Vmax measures 3.03 m/s.  5. The inferior vena cava is normal in size with greater than 50% respiratory variability, suggesting right atrial pressure of 3 mmHg. FINDINGS  Left Ventricle: Left ventricular ejection fraction, by estimation, is 70 to 75%. The left ventricle has hyperdynamic function. The left ventricle has no regional wall motion abnormalities. Definity contrast agent was given IV to delineate the left ventricular endocardial borders. The left ventricular internal cavity size was normal in size. There is no left ventricular hypertrophy. Left ventricular diastolic parameters are consistent with Grade I diastolic dysfunction (impaired relaxation). Right Ventricle: The right ventricular size is normal. No increase in right ventricular wall thickness. Right ventricular systolic function is normal. Left Atrium: Left atrial size was normal in size. Right Atrium: Right atrial size was normal in size. Pericardium: There is no evidence of pericardial effusion. Mitral Valve: The mitral valve is normal in structure. No evidence of mitral valve regurgitation. No evidence of mitral valve stenosis. Tricuspid Valve: The tricuspid valve is normal in structure. Tricuspid valve regurgitation is not demonstrated. No evidence of tricuspid stenosis. Aortic Valve: Aortic valve is not well visualized. The increased transaortic velocity may be secondary in part from hyperdynamic contraction. The aortic valve was not well visualized. Aortic valve regurgitation  is not visualized. Moderate aortic stenosis  is present. Aortic valve mean gradient measures 18.3 mmHg. Aortic valve peak gradient measures 36.8 mmHg. Aortic valve area, by VTI measures 2.03 cm. Pulmonic Valve: The pulmonic valve was normal in structure. Pulmonic valve regurgitation is not visualized. No evidence of pulmonic stenosis. Aorta: The aortic root is normal in size and structure. Venous: The inferior vena cava is normal in size with greater than 50% respiratory variability, suggesting right atrial pressure of 3 mmHg. IAS/Shunts: No atrial level shunt detected by color flow Doppler.  LEFT VENTRICLE PLAX 2D LVIDd:         4.60 cm  Diastology LVIDs:         2.60 cm  LV e' medial:    5.87 cm/s LV PW:         1.70 cm  LV E/e' medial:  16.3 LV IVS:        1.50 cm  LV e' lateral:   8.05 cm/s LVOT diam:     1.90 cm  LV E/e' lateral: 11.9 LV SV:         106 LV SV Index:   53 LVOT Area:     2.84 cm  RIGHT VENTRICLE RV Basal diam:  3.80 cm RV Mid diam:    2.60 cm RV S prime:     14.10 cm/s TAPSE (M-mode): 2.5 cm LEFT ATRIUM             Index       RIGHT ATRIUM           Index LA diam:        2.90 cm 1.44 cm/m  RA Area:     16.50 cm LA Vol (A2C):   48.1 ml 23.93 ml/m RA Volume:   46.70 ml  23.24 ml/m LA Vol (A4C):   33.7 ml 16.77 ml/m LA Biplane Vol: 40.2 ml 20.00 ml/m  AORTIC VALVE AV Area (Vmax):    1.73 cm AV Area (Vmean):   1.73 cm AV Area (VTI):     2.03 cm AV Vmax:           303.33 cm/s AV Vmean:          198.000 cm/s AV VTI:  0.524 m AV Peak Grad:      36.8 mmHg AV Mean Grad:      18.3 mmHg LVOT Vmax:         185.00 cm/s LVOT Vmean:        121.000 cm/s LVOT VTI:          0.375 m LVOT/AV VTI ratio: 0.72  AORTA Ao Root diam: 2.50 cm Ao Asc diam:  2.60 cm MITRAL VALVE MV Area (PHT): 2.21 cm    SHUNTS MV Decel Time: 343 msec    Systemic VTI:  0.38 m MV E velocity: 95.60 cm/s  Systemic Diam: 1.90 cm MV A velocity: 93.60 cm/s MV E/A ratio:  1.02 Candee Furbish MD Electronically signed by Candee Furbish MD Signature Date/Time: 10/28/2020/11:33:32 AM    Final    Disposition   Pt is being discharged home today in good condition.  Follow-up Plans & Appointments     Follow-up Information     Loel Dubonnet, NP Follow up on 11/27/2020.   Specialty: Cardiology Why: at 10am for your follow up appt with Urban Gibson, Dr. Thompson Caul NP. This is at the [place] location as a first available appt. Contact information: [place] [address] [phone]                Discharge Instructions     Call MD for:  difficulty breathing, headache or visual disturbances   Complete by: As directed    Call MD for:  persistant dizziness or light-headedness   Complete by: As directed    Call MD for:  redness, tenderness, or signs of infection (pain, swelling, redness, odor or green/yellow discharge around incision site)   Complete by: As directed    Diet - low sodium heart healthy   Complete by: As directed    Discharge instructions   Complete by: As directed    Radial Site Care Refer to this sheet in the next few weeks. These instructions provide you with information on caring for yourself after your procedure. Your caregiver may also give you more specific instructions. Your treatment has been planned according to current medical practices, but problems sometimes occur. Call your caregiver if you have any problems or questions after your procedure. HOME CARE INSTRUCTIONS You may shower the day after the procedure. Remove the bandage (dressing) and gently wash the site with plain soap and water. Gently pat the site dry.  Do not apply powder or lotion to the site.  Do not submerge the affected site in water for 3 to 5 days.  Inspect the site at least twice daily.  Do not flex or bend the affected arm for 24 hours.  No lifting over 5 pounds (2.3 kg) for 5 days after your procedure.  Do not drive home if you are discharged the same day of the procedure. Have someone else  drive you.  You may drive 24 hours after the procedure unless otherwise instructed by your caregiver.  What to expect: Any bruising will usually fade within 1 to 2 weeks.  Blood that collects in the tissue (hematoma) may be painful to the touch. It should usually decrease in size and tenderness within 1 to 2 weeks.  SEEK IMMEDIATE MEDICAL CARE IF: You have unusual pain at the radial site.  You have redness, warmth, swelling, or pain at the radial site.  You have drainage (other than a small amount of blood on the dressing).  You have chills.  You have a fever or  persistent symptoms for more than 72 hours.  You have a fever and your symptoms suddenly get worse.  Your arm becomes pale, cool, tingly, or numb.  You have heavy bleeding from the site. Hold pressure on the site.   Increase activity slowly   Complete by: As directed        Discharge Medications   Allergies as of 11/18/2020       Reactions   Penicillins Rash   Tramadol    Shake and get nauseated        Medication List     TAKE these medications    amLODipine 10 MG tablet Commonly known as: NORVASC Take 1 tablet (10 mg total) by mouth daily.   aspirin 81 MG chewable tablet Chew 1 tablet (81 mg total) by mouth daily.   atorvastatin 80 MG tablet Commonly known as: LIPITOR Take 1 tablet (80 mg total) by mouth daily.   clopidogrel 75 MG tablet Commonly known as: PLAVIX Take 1 tablet (75 mg total) by mouth daily with breakfast.   hydrochlorothiazide 12.5 MG capsule Commonly known as: MICROZIDE Take 1 capsule (12.5 mg total) by mouth daily.   isosorbide mononitrate 120 MG 24 hr tablet Commonly known as: IMDUR Take 1 tablet (120 mg total) by mouth daily. Start taking on: November 19, 2020 What changed:  medication strength how much to take   losartan 25 MG tablet Commonly known as: COZAAR Take 1 tablet (25 mg total) by mouth daily. Start taking on: November 19, 2020 What changed:  medication  strength how much to take   metoprolol succinate 25 MG 24 hr tablet Commonly known as: TOPROL-XL Take 1 tablet (25 mg total) by mouth daily. Start taking on: November 19, 2020 What changed:  medication strength how much to take additional instructions   nitroGLYCERIN 0.4 MG SL tablet Commonly known as: Nitrostat Place 1 tablet (0.4 mg total) under the tongue every 5 (five) minutes as needed. What changed: reasons to take this        Outstanding Labs/Studies   N/a   Duration of Discharge Encounter   Greater than 30 minutes including physician time.  Signed, Reino Bellis, NP 11/18/2020, 3:15 PM   I have examined the patient and reviewed assessment and plan and discussed with patient.  Agree with above as stated.  She has had intermittent pain with negative troponins.  Cath today.  Moderate disease on prior cath. No change on cath today.  Increase Imdur to prevent vasospasm.  Plan for discharge today.   Covid positive.  Basically asymptomatic.  Quarantine at home for 5 days.  Let us know if sx worsen.     Larae Grooms

## 2020-11-18 NOTE — Progress Notes (Signed)
Pt started experiencing chest pain 8/10. Imdur and amlodipine administered. EKG performed. Cardiology contacted. Cardiology came to bedside.

## 2020-11-18 NOTE — Telephone Encounter (Signed)
Pt is scheduled for cath today

## 2020-11-20 ENCOUNTER — Encounter: Payer: Self-pay | Admitting: *Deleted

## 2020-11-20 DIAGNOSIS — Z006 Encounter for examination for normal comparison and control in clinical research program: Secondary | ICD-10-CM

## 2020-11-20 NOTE — Telephone Encounter (Signed)
Patient contacted regarding discharge from [facility] on 11/18/20.  Patient understands to follow up with provider C.Walker NP on 11/27/20 at 10:05am at [facility]. Patient understands discharge instructions? YES Patient understands medications and regiment? YES Patient understands to bring all medications to this visit? YES  Ask patient:  Are you enrolled in My Chart - yes

## 2020-11-20 NOTE — Research (Addendum)
Patient is interested in enrolling in v-inception research study.  Appointment set for 10/28 10 days post COVID positive test date.  Patient stated that she is feeling much better since her last heart cath.    12/01/20 10:47 AM  Patient missed appointment for V-Inception screening on 11/27/2020, patient is now outside of screening window for enrollment.  Patient is unable to enroll

## 2020-11-21 ENCOUNTER — Ambulatory Visit: Admitting: Cardiology

## 2020-11-27 ENCOUNTER — Ambulatory Visit (HOSPITAL_BASED_OUTPATIENT_CLINIC_OR_DEPARTMENT_OTHER): Admitting: Family

## 2020-11-28 ENCOUNTER — Encounter

## 2020-12-03 ENCOUNTER — Other Ambulatory Visit: Payer: Self-pay

## 2020-12-03 ENCOUNTER — Encounter: Payer: Self-pay | Admitting: Physician Assistant

## 2020-12-03 ENCOUNTER — Ambulatory Visit (INDEPENDENT_AMBULATORY_CARE_PROVIDER_SITE_OTHER): Admitting: Physician Assistant

## 2020-12-03 VITALS — BP 120/66 | HR 69 | Ht 62.0 in | Wt 225.8 lb

## 2020-12-03 DIAGNOSIS — G4733 Obstructive sleep apnea (adult) (pediatric): Secondary | ICD-10-CM | POA: Diagnosis not present

## 2020-12-03 DIAGNOSIS — I10 Essential (primary) hypertension: Secondary | ICD-10-CM

## 2020-12-03 DIAGNOSIS — E118 Type 2 diabetes mellitus with unspecified complications: Secondary | ICD-10-CM | POA: Diagnosis not present

## 2020-12-03 DIAGNOSIS — I350 Nonrheumatic aortic (valve) stenosis: Secondary | ICD-10-CM

## 2020-12-03 DIAGNOSIS — I2510 Atherosclerotic heart disease of native coronary artery without angina pectoris: Secondary | ICD-10-CM | POA: Diagnosis not present

## 2020-12-03 DIAGNOSIS — E7849 Other hyperlipidemia: Secondary | ICD-10-CM

## 2020-12-03 MED ORDER — NITROGLYCERIN 0.4 MG SL SUBL: 0.4000 mg | SUBLINGUAL_TABLET | SUBLINGUAL | 2 refills | Status: DC | PRN

## 2020-12-03 MED ORDER — RANOLAZINE ER 500 MG PO TB12: 500.0000 mg | ORAL_TABLET | Freq: Two times a day (BID) | ORAL | 3 refills | Status: DC

## 2020-12-03 NOTE — Patient Instructions (Signed)
Medication Instructions:  Your physician has recommended you make the following change in your medication:   START: Ranexa 500mg  two times daily Take 1/2 tablet (12.5) of Metoprolol daily for 5 days, then take 1/2 tablet every other day for 3 days then stop.  *If you need a refill on your cardiac medications before your next appointment, please call your pharmacy*   Lab Work: None If you have labs (blood work) drawn today and your tests are completely normal, you will receive your results only by: [place] (if you have MyChart) OR A paper copy in the mail If you have any lab test that is abnormal or we need to change your treatment, we will call you to review the results.   Testing/Procedures: Your physician has recommended that you have a sleep study. This test records several body functions during sleep, including: brain activity, eye movement, oxygen and carbon dioxide blood levels, heart rate and rhythm, breathing rate and rhythm, the flow of air through your mouth and nose, snoring, body muscle movements, and chest and belly movement.   Follow-Up: At [facility], you and your health needs are our priority.  As part of our continuing mission to provide you with exceptional heart care, we have created designated Provider Care Teams.  These Care Teams include your primary Cardiologist (physician) and Advanced Practice Providers (APPs -  Physician Assistants and Nurse Practitioners) who all work together to provide you with the care you need, when you need it.  We recommend signing up for the patient portal called "MyChart".  Sign up information is provided on this After Visit Summary.  MyChart is used to connect with patients for Virtual Visits (Telemedicine).  Patients are able to view lab/test results, encounter notes, upcoming appointments, etc.  Non-urgent messages can be sent to your provider as well.   To learn more about what you can do with MyChart, go to  [email or web address].    Your next appointment:   Next Available  The format for your next appointment:   In Person  Provider:   Daneen Schick, MD

## 2020-12-03 NOTE — Progress Notes (Signed)
Cardiology Office Note    Date:  12/03/2020   ID:  Jodi Porter, DOB 1976-01-13, MRN [id number]   PCP:  Chesley Noon, MD   [place]  Cardiologist:  Sinclair Grooms, MD   Advanced Practice Provider:  No care team member to display Electrophysiologist:  None   [phone]   Chief Complaint  Patient presents with   Hospitalization Follow-up    History of Present Illness:  Jodi Porter is a 45 y.o. female  with CAD, hx of gestational DM, DM2, OSA, tobacco use, HTN, and HLD who is being seen 11/17/2020 for the evaluation of unstable angina.   Jodi Porter was seen for inferior STEMI 10/2020. Heart cath 10/26/20 with nonobstructive CAD without intervention. STEMI felt secondary to transient thrombosis with reperfusion vs coronary spasm.  Medical treatment was recommended and included DAPT with ASA and plavix, amlodipine, and BB.  Patient comes in for f/u. Feels terrible. Last night took 5 NTG at work-was doing an EKG on a patient. Felt tight in her throat, squeezing chest pain and headache. Chest pain eases with 1 NTG but kept happening.  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  They won't let her do light duty. Now on night shift which seems to make it work. Smothers at night and wakes up short of breath. Hasn't had a sleep study.   Patient had chest pain before checking out of office relieved with 1 NTG. She was tearful. EKG NSR with TWI laterally a little more in aVL than before, reviewed with Dr. Johnsie Cancel who didn't feel significant change.  Past Medical History:  Diagnosis Date   Gestational diabetes    Gestational diabetes    Hypertension     Past Surgical History:  Procedure Laterality Date   CORONARY/GRAFT ACUTE MI REVASCULARIZATION N/A 10/26/2020   Procedure: Coronary/Graft Acute MI Revascularization;  Surgeon: Belva Crome, MD;  Location: [facility];  Service: Cardiovascular;  Laterality: N/A;   ECTOPIC PREGNANCY SURGERY      INTRAVASCULAR PRESSURE WIRE/FFR STUDY N/A 11/18/2020   Procedure: INTRAVASCULAR PRESSURE WIRE/FFR STUDY;  Surgeon: Belva Crome, MD;  Location: [facility];  Service: Cardiovascular;  Laterality: N/A;   LEFT HEART CATH AND CORONARY ANGIOGRAPHY N/A 10/26/2020   Procedure: LEFT HEART CATH AND CORONARY ANGIOGRAPHY;  Surgeon: Belva Crome, MD;  Location: [facility];  Service: Cardiovascular;  Laterality: N/A;   LEFT HEART CATH AND CORONARY ANGIOGRAPHY N/A 11/18/2020   Procedure: LEFT HEART CATH AND CORONARY ANGIOGRAPHY;  Surgeon: Belva Crome, MD;  Location: [facility];  Service: Cardiovascular;  Laterality: N/A;    Current Medications: Current Meds  Medication Sig   amLODipine (NORVASC) 10 MG tablet Take 1 tablet (10 mg total) by mouth daily.   aspirin 81 MG chewable tablet Chew 1 tablet (81 mg total) by mouth daily.   atorvastatin (LIPITOR) 80 MG tablet Take 1 tablet (80 mg total) by mouth daily.   clopidogrel (PLAVIX) 75 MG tablet Take 1 tablet (75 mg total) by mouth daily with breakfast.   hydrochlorothiazide (MICROZIDE) 12.5 MG capsule Take 1 capsule (12.5 mg total) by mouth daily.   isosorbide mononitrate (IMDUR) 120 MG 24 hr tablet Take 1 tablet (120 mg total) by mouth daily.   losartan (COZAAR) 25 MG tablet Take 1 tablet (25 mg total) by mouth daily.   ranolazine (RANEXA) 500 MG 12 hr tablet Take 1 tablet (500 mg  total) by mouth 2 (two) times daily.   [DISCONTINUED] metoprolol succinate (TOPROL-XL) 25 MG 24 hr tablet Take 1 tablet (25 mg total) by mouth daily.   [DISCONTINUED] nitroGLYCERIN (NITROSTAT) 0.4 MG SL tablet Place 1 tablet (0.4 mg total) under the tongue every 5 (five) minutes as needed. (Patient taking differently: Place 0.4 mg under the tongue every 5 (five) minutes as needed for chest pain.)     Allergies:   Penicillins and Tramadol   Social History   Socioeconomic History   Marital status: Single    Spouse name: Not on file   Number of  children: Not on file   Years of education: Not on file   Highest education level: Not on file  Occupational History   Not on file  Tobacco Use   Smoking status: Every Day    Packs/day: 0.50    Types: Cigarettes   Smokeless tobacco: Never  Substance and Sexual Activity   Alcohol use: No   Drug use: No   Sexual activity: Yes    Birth control/protection: I.U.D.  Other Topics Concern   Not on file  Social History Narrative   Not on file   Social Determinants of Health   Financial Resource Strain: Not on file  Food Insecurity: Not on file  Transportation Needs: Not on file  Physical Activity: Not on file  Stress: Not on file  Social Connections: Not on file     Family History:  The patient's  family history is not on file.   ROS:   Please see the history of present illness.    ROS All other systems reviewed and are negative.   PHYSICAL EXAM:   VS:  BP 120/66   Pulse 69   Ht 5\' 2"  (1.575 m)   Wt 225 lb 12.8 oz (102.4 kg)   SpO2 99%   BMI 41.30 kg/m   Physical Exam  GEN: Obese, in no acute distress  Neck: no JVD, carotid bruits, or masses Cardiac:RRR; 2/6 harsh systolic murmur at the left sternal border into her carotids Respiratory:  clear to auscultation bilaterally, normal work of breathing GI: soft, nontender, nondistended, + BS Ext: without cyanosis, clubbing, or edema, Good distal pulses bilaterally MS: no deformity or atrophy  Neuro:  Alert and Oriented x 3,  Psych: euthymic mood, full affect  Wt Readings from Last 3 Encounters:  12/03/20 225 lb 12.8 oz (102.4 kg)  11/17/20 225 lb 12.8 oz (102.4 kg)  11/17/20 225 lb 12.8 oz (102.4 kg)      Studies/Labs Reviewed:   EKG:  EKG is ordered today.   NSR with TWI laterally a little more in aVL than before, reviewed with Dr. Johnsie Cancel who didn't feel significant change.  Recent Labs: 10/26/2020: ALT 15 11/18/2020: BUN 9; Creatinine, Ser 1.21; Hemoglobin 14.0; Platelets 255; Potassium 3.3; Sodium 135   Lipid  Panel    Component Value Date/Time   CHOL 128 11/18/2020 0154   TRIG 94 11/18/2020 0154   HDL 34 (L) 11/18/2020 0154   CHOLHDL 3.8 11/18/2020 0154   VLDL 19 11/18/2020 0154   LDLCALC 75 11/18/2020 0154    Additional studies/ records that were reviewed today include:  Cath: 11/18/20   CONCLUSIONS: Right coronary contains 40% mid stenosis, 50% distal stenosis, and PDA with 50% ostial to proximal stenosis.  RFR across the stenoses obtained in the mid to distal PDA is 0.95. Widely patent left main Minimal luminal irregularities in the LAD 30% mid circumflex. LVEDP 10 mmHg.  RECOMMENDATIONS:   The patient has clinical features of Prinzmetal's angina.  We will increase isosorbide mononitrate to 120 mg/day.  Decrease beta-blocker therapy to Toprol-XL 25 mg/day (could be exacerbating vasoconstriction and is certainly contributing to significant bradycardia).  Consider adding calcium channel blocker although I prefer angiotensin receptor blocker at this stage given glucose intolerance.  Liberal use of sublingual nitroglycerin as needed as an outpatient. Potential discharge later today.   Diagnostic Dominance: Right _____________ 2Decho 10/28/20 IMPRESSIONS     1. Left ventricular ejection fraction, by estimation, is 70 to 75%. The  left ventricle has hyperdynamic function. The left ventricle has no  regional wall motion abnormalities. Left ventricular diastolic parameters  are consistent with Grade I diastolic  dysfunction (impaired relaxation).   2. Right ventricular systolic function is normal. The right ventricular  size is normal.   3. The mitral valve is normal in structure. No evidence of mitral valve  regurgitation. No evidence of mitral stenosis.   4. Aortic valve is not well visualized. The increased transaortic  velocity may be secondary in part from hyperdynamic contraction. The  aortic valve was not well visualized. Aortic valve regurgitation is not  visualized.  Moderate aortic valve stenosis.  Aortic valve mean gradient measures 18.3 mmHg. Aortic valve Vmax measures  3.03 m/s.   5. The inferior vena cava is normal in size with greater than 50%  respiratory variability, suggesting right atrial pressure of 3 mmHg.   FINDINGS   Risk Assessment/Calculations:         ASSESSMENT:    1. Coronary artery disease involving native coronary artery of native heart without angina pectoris   2. Aortic valve stenosis, etiology of cardiac valve disease unspecified   3. Essential hypertension   4. Type 2 diabetes mellitus with complication, without long-term current use of insulin ([place])   5. OSA (obstructive sleep apnea)   6. Other hyperlipidemia   7. Morbid obesity ([place])      PLAN:  In order of problems listed above:  CAD inferior STEMI 10/2020 cardiac cath nonobstructive CAD no intervention.  STEMI felt secondary to transient thrombosis with reperfusion versus coronary spasm treated with Imdur amlodipine aspirin and Plavix.  Beta-blocker decreased.  Patient was also COVID-positive.  Patient continues to have chest pain mostly at work when exerting herself.  She had to use 5 nitroglycerin last night over a 12-hour shift.  We will wean off beta-blocker and try to add Ranexa 500 mg twice daily.  We will give her slip for light duty. Patient had chest pain before checking out of office relieved with 1 NTG. She was tearful. EKG NSR with TWI laterally a little more in aVL than before, reviewed with Dr. Johnsie Cancel who didn't feel significant change.  Aortic Stenosis-Aortic valve not well visualized but at least moderate. Will need to monitor.  Hypertension blood pressure well controlled  DM2 A1c 6.4 diet controlled  OSA needs sleep study.  May be contributing to some of her symptoms-snores, awakens smothered, stop breathing,   HLD LDL 75 on Lipitor  Morbid obesity exercise and weight loss  Shared Decision Making/Informed Consent        Medication  Adjustments/Labs and Tests Ordered: Current medicines are reviewed at length with the patient today.  Concerns regarding medicines are outlined above.  Medication changes, Labs and Tests ordered today are listed in the Patient Instructions below. Patient Instructions  Medication Instructions:  Your physician has recommended you make the following change in your medication:   START:  Ranexa 500mg  two times daily Take 1/2 tablet (12.5) of Metoprolol daily for 5 days, then take 1/2 tablet every other day for 3 days then stop.  *If you need a refill on your cardiac medications before your next appointment, please call your pharmacy*   Lab Work: None If you have labs (blood work) drawn today and your tests are completely normal, you will receive your results only by: [place] (if you have MyChart) OR A paper copy in the mail If you have any lab test that is abnormal or we need to change your treatment, we will call you to review the results.   Testing/Procedures: Your physician has recommended that you have a sleep study. This test records several body functions during sleep, including: brain activity, eye movement, oxygen and carbon dioxide blood levels, heart rate and rhythm, breathing rate and rhythm, the flow of air through your mouth and nose, snoring, body muscle movements, and chest and belly movement.   Follow-Up: At [facility], you and your health needs are our priority.  As part of our continuing mission to provide you with exceptional heart care, we have created designated Provider Care Teams.  These Care Teams include your primary Cardiologist (physician) and Advanced Practice Providers (APPs -  Physician Assistants and Nurse Practitioners) who all work together to provide you with the care you need, when you need it.  We recommend signing up for the patient portal called "MyChart".  Sign up information is provided on this After Visit Summary.  MyChart is used to connect  with patients for Virtual Visits (Telemedicine).  Patients are able to view lab/test results, encounter notes, upcoming appointments, etc.  Non-urgent messages can be sent to your provider as well.   To learn more about what you can do with MyChart, go to [email or web address].    Your next appointment:   Next Available  The format for your next appointment:   In Person  Provider:   Daneen Schick, MD     Signed, Ermalinda Barrios, PA-C  12/03/2020 2:11 PM    [address], [place], [place]  [address] Phone: [phone]; Fax: [phone]

## 2020-12-10 ENCOUNTER — Telehealth: Payer: Self-pay | Admitting: *Deleted

## 2020-12-10 ENCOUNTER — Telehealth: Payer: Self-pay | Admitting: Interventional Cardiology

## 2020-12-10 NOTE — Telephone Encounter (Signed)
Prior Authorization for [employer] sent to [employer] via [employer]. Reference # Per Zaye no pa required.

## 2020-12-10 NOTE — Telephone Encounter (Signed)
Patient needs another letter being more specific on what light duty is.  Like he work status can she go back to work full-time, part-time or PRN.  What is the weight limit, can see do CPR? What is her limitations? She currently works 12 hour shifts.   She is scheduled to go to work today, she would like to pick up the letter today.

## 2020-12-10 NOTE — Telephone Encounter (Signed)
I s/w the pt and she has been given PIN# [id number] and the ok to proceed with [place] Sleep Study. Pt will do the sleep study sometime between tonight and early next week. Pt aware once study has been read by the cardiologist we will call her with the results and recommendations if any. Pt thanked me for the call and the help.

## 2020-12-10 NOTE — Telephone Encounter (Signed)
-----   Message from Michae Kava, [place] sent at 12/10/2020  8:35 AM EST ----- Regarding: RE: Itamar Good morning ladies,   I am just following up to see if you have been able to check for authorization for Itamar study.  Thank you  Arbie Cookey  ----- Message ----- From: Michae Kava, CMA Sent: 12/03/2020  12:50 PM EST To: Lauralee Evener, CMA, Freada Bergeron, CMA, # Subject: [place] ladies,   Ermalinda Barrios [facility] ordered an [place].  Can someone check and if see if pt is approved.   Thank you  Arbie Cookey

## 2020-12-10 NOTE — Telephone Encounter (Signed)
-----   Message from Freada Bergeron, [place] sent at 12/10/2020  3:18 PM EST ----- Regarding: RE: [place] for [employer] sent to [facility] via [employer]. Reference # Per Zaye no pa required.   ----- Message ----- From: Michae Kava, CMA Sent: 12/10/2020   8:36 AM EST To: Lauralee Evener, CMA, Freada Bergeron, CMA, # Subject: RE: [place] morning ladies,   I am just following up to see if you have been able to check for authorization for Itamar study.  Thank you  Arbie Cookey  ----- Message ----- From: Michae Kava, CMA Sent: 12/03/2020  12:50 PM EST To: Lauralee Evener, CMA, Freada Bergeron, CMA, # Subject: [place] ladies,   Ermalinda Barrios [facility] ordered an [place].  Can someone check and if see if pt is approved.   Thank you  Arbie Cookey

## 2020-12-14 ENCOUNTER — Encounter (INDEPENDENT_AMBULATORY_CARE_PROVIDER_SITE_OTHER): Admitting: Cardiology

## 2020-12-14 DIAGNOSIS — G4733 Obstructive sleep apnea (adult) (pediatric): Secondary | ICD-10-CM | POA: Diagnosis not present

## 2020-12-25 NOTE — Procedures (Signed)
   Sleep Study Report  Patient Information Study Date: 12/14/20 Patient Name: Jodi Porter Patient ID: [id number] Birth Date: Sep 27, 1975 Age: 45 Gender: Female BMI: 41.4 (W=225 lb, H=5' 2'') Neck Circ.: 45 '' Referring Physician: Daneen Schick, MD  TEST DESCRIPTION: Home sleep apnea testing was completed using the WatchPat, a Type 1 device, utilizing peripheral arterial tonometry (PAT), chest movement, actigraphy, pulse oximetry, pulse rate, body position and snore. AHI was calculated with apnea and hypopnea using valid sleep time as the denominator. RDI includes apneas, hypopneas, and RERAs. The data acquired and the scoring of sleep and all associated events were performed in accordance with the recommended standards and specifications as outlined in the AASM Manual for the Scoring of Sleep and Associated Events 2.2.0 (2015).  FINDINGS: 1. Severe Obstructive Sleep Apnea with AHI 41.3/hr. 2. No Central Sleep Apnea with pAHIc 0.5/hr. 3. Oxygen desaturations as low as 81%. 4. Severe soringis present. 6 minutes were spent with O2 sats <88%. 5. Total sleep time was 2 hrs and 12 min. 6. 16.6% of total sleep time was spent in REM sleep. 7. Normal sleep onset latency at 19 min. 8. Shortened REM sleep onset latency at 67 min. 9. Total awakenings were 17.  DIAGNOSIS: Severe Obstructive Sleep Apnea (G47.33)  RECOMMENDATIONS: 1. Clinical correlation of these findings is necessary. The decision to treat obstructive sleep apnea (OSA) is usually based on the presence of apnea symptoms or the presence of associated medical conditions such as Hypertension, Congestive Heart Failure, Atrial Fibrillation or Obesity. The most common symptoms of OSA are snoring, gasping for breath while sleeping, daytime sleepiness and fatigue.  2. Initiating apnea therapy is recommended given the presence of symptoms and/or associated conditions. Recommend proceeding with one of the following:   a. Auto-CPAP  therapy with a pressure range of 5-20cm H2O.   b. An oral appliance (OA) that can be obtained from certain dentists with expertise in sleep medicine. These are primarily of use in non-obese patients with mild and moderate disease.   c. An ENT consultation which may be useful to look for specific causes of obstruction and possible treatment options.   d. If patient is intolerant to PAP therapy, consider referral to ENT for evaluation for hypoglossal nerve stimulator.  3. Close follow-up is necessary to ensure success with CPAP or oral appliance therapy for maximum benefit .  4. A follow-up oximetry study on CPAP is recommended to assess the adequacy of therapy and determine the need for supplemental oxygen or the potential need for Bi-level therapy. An arterial blood gas to determine the adequacy of baseline ventilation and oxygenation should also be considered.  5. Healthy sleep recommendations include: adequate nightly sleep (normal 7-9 hrs/night), avoidance of caffeine after noon and alcohol near bedtime, and maintaining a sleep environment that is cool, dark and quiet.  6. Weight loss for overweight patients is recommended. Even modest amounts of weight loss can significantly improve the severity of sleep apnea.  7. Snoring recommendations include: weight loss where appropriate, side sleeping, and avoidance of alcohol before bed.  8. Operation of motor vehicle should be avoided when sleepy.  Signature: Electronically Signed: 12/26/20 Fransico Him, MD; [facility]; [place], American Board of Sleep Medicine

## 2020-12-28 ENCOUNTER — Ambulatory Visit

## 2020-12-28 DIAGNOSIS — G4733 Obstructive sleep apnea (adult) (pediatric): Secondary | ICD-10-CM

## 2020-12-28 NOTE — Progress Notes (Signed)
Cardiology Office Note:    Date:  12/30/2020   ID:  Jodi Porter, DOB 03-21-1975, MRN [id number]  PCP:  Chesley Noon, MD  Cardiologist:  Sinclair Grooms, MD   Referring MD: Chesley Noon, MD   No chief complaint on file.   History of Present Illness:    Jodi Porter is a 45 y.o. female with a hx of gestational diabetes, diabetes mellitus type 2, obstructive sleep apnea, hyperlipidemia, and CAD with 10/26/2020 had aborted inferior myocardial infarction and found to have 50% mid and 60% distal RCA (Suspected ruptured plaque with thrombus resolution), and recurrent acute presentation with USAP re-evaluated with 11/18/2020 demonstrating improved anatomy.   Beta blocker weaned at last OV 12/03/2020.  She has felt better since weaning beta-blocker and increasing isosorbide to 120 mg/day.  She did start noticing but for some reason is only taking it once a day.  She tells me that she was worried it might put too much "pressure on her heart".  She has been able to work.  She has moved her work to night shift which has been somewhat easier on her.  She continues to smoke cigarettes.  Since medication adjustment she has not needed to take as many sublingual nitroglycerin tablets.  We did discuss her current medical regimen and now she understands that isosorbide is long-acting nitroglycerin.  She does understand that she can take this medication and still use sublingual nitro.  We discussed moving the dosing of the isosorbide to p.m. to give her better coverage during the stress of work.  Past Medical History:  Diagnosis Date   Gestational diabetes    Gestational diabetes    Hypertension     Past Surgical History:  Procedure Laterality Date   CORONARY/GRAFT ACUTE MI REVASCULARIZATION N/A 10/26/2020   Procedure: Coronary/Graft Acute MI Revascularization;  Surgeon: Belva Crome, MD;  Location: [place] [facility];  Service: Cardiovascular;  Laterality: N/A;   ECTOPIC PREGNANCY  SURGERY     INTRAVASCULAR PRESSURE WIRE/FFR STUDY N/A 11/18/2020   Procedure: INTRAVASCULAR PRESSURE WIRE/FFR STUDY;  Surgeon: Belva Crome, MD;  Location: [place] [facility];  Service: Cardiovascular;  Laterality: N/A;   LEFT HEART CATH AND CORONARY ANGIOGRAPHY N/A 10/26/2020   Procedure: LEFT HEART CATH AND CORONARY ANGIOGRAPHY;  Surgeon: Belva Crome, MD;  Location: [place] [facility];  Service: Cardiovascular;  Laterality: N/A;   LEFT HEART CATH AND CORONARY ANGIOGRAPHY N/A 11/18/2020   Procedure: LEFT HEART CATH AND CORONARY ANGIOGRAPHY;  Surgeon: Belva Crome, MD;  Location: [place] [facility];  Service: Cardiovascular;  Laterality: N/A;    Current Medications: Current Meds  Medication Sig   amLODipine (NORVASC) 10 MG tablet Take 1 tablet (10 mg total) by mouth daily.   aspirin 81 MG chewable tablet Chew 1 tablet (81 mg total) by mouth daily.   atorvastatin (LIPITOR) 80 MG tablet Take 1 tablet (80 mg total) by mouth daily.   clopidogrel (PLAVIX) 75 MG tablet Take 1 tablet (75 mg total) by mouth daily with breakfast.   hydrochlorothiazide (MICROZIDE) 12.5 MG capsule Take 1 capsule (12.5 mg total) by mouth daily.   losartan (COZAAR) 25 MG tablet Take 1 tablet (25 mg total) by mouth daily.   nitroGLYCERIN (NITROSTAT) 0.4 MG SL tablet Place 1 tablet (0.4 mg total) under the tongue every 5 (five) minutes as needed.   ranolazine (RANEXA) 500 MG 12 hr tablet Take 1 tablet (500 mg total) by mouth 2 (two)  times daily.   [DISCONTINUED] isosorbide mononitrate (IMDUR) 120 MG 24 hr tablet Take 1 tablet (120 mg total) by mouth daily.     Allergies:   Penicillins and Tramadol   Social History   Socioeconomic History   Marital status: Single    Spouse name: Not on file   Number of children: Not on file   Years of education: Not on file   Highest education level: Not on file  Occupational History   Not on file  Tobacco Use   Smoking status: Every Day    Packs/day: 0.50    Types:  Cigarettes   Smokeless tobacco: Never  Substance and Sexual Activity   Alcohol use: No   Drug use: No   Sexual activity: Yes    Birth control/protection: I.U.D.  Other Topics Concern   Not on file  Social History Narrative   Not on file   Social Determinants of Health   Financial Resource Strain: Not on file  Food Insecurity: Not on file  Transportation Needs: Not on file  Physical Activity: Not on file  Stress: Not on file  Social Connections: Not on file     Family History: The patient's family history is not on file.  ROS:   Please see the history of present illness.    Under stress.  Can stop smoking and does not sound encouraging when I asked her for commitment.  All other systems reviewed and are negative.  EKGs/Labs/Other Studies Reviewed:    The following studies were reviewed today:  2D Doppler echocardiogram October 28, 2020: IMPRESSIONS     1. Left ventricular ejection fraction, by estimation, is 70 to 75%. The  left ventricle has hyperdynamic function. The left ventricle has no  regional wall motion abnormalities. Left ventricular diastolic parameters  are consistent with Grade I diastolic  dysfunction (impaired relaxation).   2. Right ventricular systolic function is normal. The right ventricular  size is normal.   3. The mitral valve is normal in structure. No evidence of mitral valve  regurgitation. No evidence of mitral stenosis.   4. Aortic valve is not well visualized. The increased transaortic  velocity may be secondary in part from hyperdynamic contraction. The  aortic valve was not well visualized. Aortic valve regurgitation is not  visualized. Moderate aortic valve stenosis.  Aortic valve mean gradient measures 18.3 mmHg. Aortic valve Vmax measures  3.03 m/s.   5. The inferior vena cava is normal in size with greater than 50%  respiratory variability, suggesting right atrial pressure of 3 mmHg.   EKG:  EKG not performed  Recent  Labs: 10/26/2020: ALT 15 11/18/2020: BUN 9; Creatinine, Ser 1.21; Hemoglobin 14.0; Platelets 255; Potassium 3.3; Sodium 135  Recent Lipid Panel    Component Value Date/Time   CHOL 128 11/18/2020 0154   TRIG 94 11/18/2020 0154   HDL 34 (L) 11/18/2020 0154   CHOLHDL 3.8 11/18/2020 0154   VLDL 19 11/18/2020 0154   LDLCALC 75 11/18/2020 0154    Physical Exam:    VS:  BP 132/76   Pulse 60   Ht 5\' 2"  (1.575 m)   Wt 232 lb (105.2 kg)   SpO2 99%   BMI 42.43 kg/m     Wt Readings from Last 3 Encounters:  12/30/20 232 lb (105.2 kg)  12/03/20 225 lb 12.8 oz (102.4 kg)  11/17/20 225 lb 12.8 oz (102.4 kg)     GEN: Moderate obesity. No acute distress HEENT: Normal NECK: No JVD. LYMPHATICS: No  lymphadenopathy CARDIAC: 2/6 early systolic murmur. RRR no gallop, or edema. VASCULAR:  Normal Pulses. No bruits. RESPIRATORY:  Clear to auscultation without rales, wheezing or rhonchi  ABDOMEN: Soft, non-tender, non-distended, No pulsatile mass, MUSCULOSKELETAL: No deformity  SKIN: Warm and dry NEUROLOGIC:  Alert and oriented x 3 PSYCHIATRIC:  Normal affect   ASSESSMENT:    1. Coronary artery disease involving native coronary artery of native heart without angina pectoris   2. OSA (obstructive sleep apnea)   3. Aortic valve stenosis, etiology of cardiac valve disease unspecified   4. Essential hypertension   5. Type 2 diabetes mellitus with complication, without long-term current use of insulin (HCC)   6. Other hyperlipidemia   7. Morbid obesity ([place])    PLAN:    In order of problems listed above:  Vasospastic angina improved off beta-blocker therapy and with up titration of long-acting nitrates in the edition of ranolazine.  Nitroglycerin use is decreased.  She has mild to moderate obstructive coronary disease but continues to smoke which aggravates the tendency to have episodes of coronary spasm. She needs to be compliant with CPAP. Systolic murmur compatible with LV outflow  obstruction.  Still not completely clear whether this is dynamic LV outflow obstruction versus valvular basis.  May need to have additional study at some point in the future. Blood pressure control is quite good currently.  CPAP compliance, smoking cessation, and moderation of salt in diet are important. Consider adding SGLT2 therapy for cardioprotection given prediabetes. Continue high intensity statin therapy with target LDL less than 70   In future if angina continues, further uptitrate ranolazine.  She is only taking 500 mg/day currently.  I have talked her about switching the isosorbide to p.m. dosing to cover her most active time.  Each day which is the night shift.  Perhaps she should take this medication around 7:53 PM before going to work.    Plan 53-month follow-up.   Medication Adjustments/Labs and Tests Ordered: Current medicines are reviewed at length with the patient today.  Concerns regarding medicines are outlined above.  No orders of the defined types were placed in this encounter.  Meds ordered this encounter  Medications   isosorbide mononitrate (IMDUR) 120 MG 24 hr tablet    Sig: Take 1 tablet (120 mg total) by mouth daily.    Dispense:  90 tablet    Refill:  3    Patient Instructions  Medication Instructions:  Your physician recommends that you continue on your current medications as directed. Please refer to the Current Medication list given to you today.  1) Dr. Tamala Julian recommends that you take your Ranexa twice a day and switch your Isosorbide to taking it at night time.   *If you need a refill on your cardiac medications before your next appointment, please call your pharmacy*   Lab Work: None If you have labs (blood work) drawn today and your tests are completely normal, you will receive your results only by: [place] (if you have MyChart) OR A paper copy in the mail If you have any lab test that is abnormal or we need to change your treatment, we  will call you to review the results.   Testing/Procedures: None   Follow-Up: At [facility], you and your health needs are our priority.  As part of our continuing mission to provide you with exceptional heart care, we have created designated Provider Care Teams.  These Care Teams include your primary Cardiologist (physician) and Advanced Practice Providers (  APPs -  Physician Assistants and Nurse Practitioners) who all work together to provide you with the care you need, when you need it.  We recommend signing up for the patient portal called "MyChart".  Sign up information is provided on this After Visit Summary.  MyChart is used to connect with patients for Virtual Visits (Telemedicine).  Patients are able to view lab/test results, encounter notes, upcoming appointments, etc.  Non-urgent messages can be sent to your provider as well.   To learn more about what you can do with MyChart, go to [email or web address].    Your next appointment:   3-4 month(s)  The format for your next appointment:   In Person  Provider:   Sinclair Grooms, MD     Other Instructions     Signed, Sinclair Grooms, MD  12/30/2020 12:33 PM    [place]

## 2020-12-30 ENCOUNTER — Other Ambulatory Visit: Payer: Self-pay

## 2020-12-30 ENCOUNTER — Ambulatory Visit (INDEPENDENT_AMBULATORY_CARE_PROVIDER_SITE_OTHER): Admitting: Interventional Cardiology

## 2020-12-30 ENCOUNTER — Telehealth: Payer: Self-pay | Admitting: *Deleted

## 2020-12-30 ENCOUNTER — Encounter: Payer: Self-pay | Admitting: Interventional Cardiology

## 2020-12-30 VITALS — BP 132/76 | HR 60 | Ht 62.0 in | Wt 232.0 lb

## 2020-12-30 DIAGNOSIS — G4733 Obstructive sleep apnea (adult) (pediatric): Secondary | ICD-10-CM

## 2020-12-30 DIAGNOSIS — I2510 Atherosclerotic heart disease of native coronary artery without angina pectoris: Secondary | ICD-10-CM | POA: Diagnosis not present

## 2020-12-30 DIAGNOSIS — I350 Nonrheumatic aortic (valve) stenosis: Secondary | ICD-10-CM | POA: Diagnosis not present

## 2020-12-30 DIAGNOSIS — E118 Type 2 diabetes mellitus with unspecified complications: Secondary | ICD-10-CM

## 2020-12-30 DIAGNOSIS — I10 Essential (primary) hypertension: Secondary | ICD-10-CM

## 2020-12-30 DIAGNOSIS — E7849 Other hyperlipidemia: Secondary | ICD-10-CM

## 2020-12-30 MED ORDER — ISOSORBIDE MONONITRATE ER 120 MG PO TB24: 120.0000 mg | ORAL_TABLET | Freq: Every day | ORAL | 3 refills | Status: DC

## 2020-12-30 NOTE — Telephone Encounter (Signed)
-----   Message from Sueanne Margarita, MD sent at 12/25/2020  9:50 PM EST ----- Please let patient know that they have sleep apnea.  Recommend therapeutic CPAP titration for treatment of patient's sleep disordered breathing.  If unable to perform an in lab titration then initiate ResMed auto CPAP from 4 to 15cm H2O with heated humidity and mask of choice and overnight pulse ox on CPAP.

## 2020-12-30 NOTE — Patient Instructions (Signed)
Medication Instructions:  Your physician recommends that you continue on your current medications as directed. Please refer to the Current Medication list given to you today.  1) Dr. Tamala Julian recommends that you take your Ranexa twice a day and switch your Isosorbide to taking it at night time.   *If you need a refill on your cardiac medications before your next appointment, please call your pharmacy*   Lab Work: None If you have labs (blood work) drawn today and your tests are completely normal, you will receive your results only by: [place] (if you have MyChart) OR A paper copy in the mail If you have any lab test that is abnormal or we need to change your treatment, we will call you to review the results.   Testing/Procedures: None   Follow-Up: At [facility], you and your health needs are our priority.  As part of our continuing mission to provide you with exceptional heart care, we have created designated Provider Care Teams.  These Care Teams include your primary Cardiologist (physician) and Advanced Practice Providers (APPs -  Physician Assistants and Nurse Practitioners) who all work together to provide you with the care you need, when you need it.  We recommend signing up for the patient portal called "MyChart".  Sign up information is provided on this After Visit Summary.  MyChart is used to connect with patients for Virtual Visits (Telemedicine).  Patients are able to view lab/test results, encounter notes, upcoming appointments, etc.  Non-urgent messages can be sent to your provider as well.   To learn more about what you can do with MyChart, go to [email or web address].    Your next appointment:   3-4 month(s)  The format for your next appointment:   In Person  Provider:   Sinclair Grooms, MD     Other Instructions

## 2021-01-01 ENCOUNTER — Telehealth: Payer: Self-pay | Admitting: Interventional Cardiology

## 2021-01-01 MED ORDER — NITROGLYCERIN 0.4 MG SL SUBL
0.4000 mg | SUBLINGUAL_TABLET | SUBLINGUAL | 8 refills | Status: DC | PRN
Start: 2021-01-01 — End: 2022-01-11

## 2021-01-01 NOTE — Telephone Encounter (Signed)
Pt's medication was sent to pt's pharmacy as requested. Confirmation received.  °

## 2021-01-01 NOTE — Telephone Encounter (Signed)
*  STAT* If patient is at the pharmacy, call can be transferred to refill team.   1. Which medications need to be refilled? (please list name of each medication and dose if known) new prescription for Niroglycerin  2. Which pharmacy/location (including street and city if local pharmacy) is medication to be sent to? [facility] [employer] [place]   3. Do they need a 30 day or 90 day supply?

## 2021-01-01 NOTE — Telephone Encounter (Signed)
I spoke with patient. She is requesting letter stating her diagnosis and that she must carry NTG with her at all times.  Also requests letter state she is limited to light duty.  Letter needs to indicate how long she would be limited to light duty.

## 2021-01-01 NOTE — Telephone Encounter (Signed)
New Message:     Patient says she needs a letter for work stating her diagnosis, also stating that the she needs her Nitroglycerin  with her at all times. She also need in that letter again stating that she can only do light duties and can not lift no more than 10 lbs at one time.Please inthis  letter tell the exact time frame or put until further  notice.

## 2021-01-02 NOTE — Telephone Encounter (Signed)
Spoke with pt and made her aware of information from Dr. Tamala Julian.  Pt verbalized understanding.

## 2021-01-19 NOTE — Telephone Encounter (Signed)
The patient has been notified of the result and verbalized understanding.  All questions (if any) were answered. Marolyn Hammock, [place] 01/19/2021 5:35 PM    No PA required Will schedule test

## 2021-01-19 NOTE — Telephone Encounter (Signed)
No PA required: Call ref# [id number].

## 2021-01-21 ENCOUNTER — Telehealth: Payer: Self-pay

## 2021-01-21 NOTE — Telephone Encounter (Signed)
Pt scheduled for CPAP titration on 03/02/20 at [place]. Pt made aware of appointment.

## 2021-01-29 NOTE — Telephone Encounter (Signed)
Patient is scheduled for CPAP Titration on 03-02-20. Patient understands his titration study will be done at [facility] sleep lab. Patient understands he will receive a letter in a week or so detailing appointment, date, time, and location.

## 2021-03-02 ENCOUNTER — Other Ambulatory Visit: Payer: Self-pay

## 2021-03-02 ENCOUNTER — Ambulatory Visit (HOSPITAL_BASED_OUTPATIENT_CLINIC_OR_DEPARTMENT_OTHER): Attending: Cardiology | Admitting: Cardiology

## 2021-03-02 VITALS — Ht 65.0 in | Wt 230.0 lb

## 2021-03-02 DIAGNOSIS — G4733 Obstructive sleep apnea (adult) (pediatric): Secondary | ICD-10-CM | POA: Diagnosis present

## 2021-03-03 NOTE — Procedures (Signed)
° °  Patient Name: Jodi Porter, Jodi Porter Date: 03/02/2021 Gender: Female D.O.B: 04-07-1975 Age (years): 76 Referring Provider: Fransico Him MD, ABSM Height (inches): 65 Interpreting Physician: Fransico Him MD, ABSM Weight (lbs): 230 RPSGT: Zadie Rhine BMI: 38 MRN: [id number] Neck Size: 16.00  CLINICAL INFORMATION The patient is referred for a CPAP titration to treat sleep apnea.  SLEEP STUDY TECHNIQUE As per the AASM Manual for the Scoring of Sleep and Associated Events v2.3 (April 2016) with a hypopnea requiring 4% desaturations.  The channels recorded and monitored were frontal, central and occipital EEG, electrooculogram (EOG), submentalis EMG (chin), nasal and oral airflow, thoracic and abdominal wall motion, anterior tibialis EMG, snore microphone, electrocardiogram, and pulse oximetry. Continuous positive airway pressure (CPAP) was initiated at the beginning of the study and titrated to treat sleep-disordered breathing.  MEDICATIONS Medications self-administered by patient taken the night of the study : N/A  TECHNICIAN COMMENTS Comments added by technician: pt had one restroom visted Comments added by scorer: N/A RESPIRATORY PARAMETERS Optimal PAP Pressure (cm): 19  AHI at Optimal Pressure (/hr):0 Overall Minimal O2 (%):90.0  Supine % at Optimal Pressure (%):13 Minimal O2 at Optimal Pressure (%): 95.0   SLEEP ARCHITECTURE The study was initiated at 10:02:00 PM and ended at 4:37:48 AM.  Sleep onset time was 14.2 minutes and the sleep efficiency was 83.1%. The total sleep time was 329 minutes.  The patient spent 3.5% of the night in stage N1 sleep, 59.9% in stage N2 sleep, 0.0% in stage N3 and 36.6% in REM.Stage REM latency was 148.0 minutes  Wake after sleep onset was 52.6. Alpha intrusion was absent. Supine sleep was 6.72%.  CARDIAC DATA The 2 lead EKG demonstrated sinus rhythm. The mean heart rate was 58.5 beats per minute. Other EKG findings include: None  LEG  MOVEMENT DATA The total Periodic Limb Movements of Sleep (PLMS) were 0. The PLMS index was 0.0. A PLMS index of <15 is considered normal in adults.  IMPRESSIONS - The optimal PAP pressure was 19 cm of water. - Significant oxygen desaturations were not observed during this titration (min O2 = 90.0%). - No snoring was audible during this study. - No cardiac abnormalities were observed during this study. - Clinically significant periodic limb movements were not noted during this study. Arousals associated with PLMs were rare.  DIAGNOSIS - Obstructive Sleep Apnea (G47.33)  RECOMMENDATIONS - Trial of auto CPAP therapy from 4 to 20cm H2O with a Medium size Resmed Full Face Mask AirFit F20 mask and heated humidification. - Avoid alcohol, sedatives and other CNS depressants that may worsen sleep apnea and disrupt normal sleep architecture. - Sleep hygiene should be reviewed to assess factors that may improve sleep quality. - Weight management and regular exercise should be initiated or continued. - Return to Sleep Center for re-evaluation after 6 weeks of therapy  [Electronically signed] 03/03/2021 08:14 PM  Fransico Him MD, ABSM Diplomate, American Board of Sleep Medicine

## 2021-03-10 ENCOUNTER — Telehealth: Payer: Self-pay | Admitting: *Deleted

## 2021-03-10 NOTE — Telephone Encounter (Signed)
-----   Message from Lauralee Evener, [place] sent at 03/04/2021 10:06 AM EST -----  ----- Message ----- From: Sueanne Margarita, MD Sent: 03/03/2021   8:16 PM EST To: [facility] Sleep Studies  Please let patient know that they had a successful PAP titration and let DME know that orders are in EPIC.  Please set up 6 week OV with me.

## 2021-03-10 NOTE — Telephone Encounter (Signed)
The patient has been notified of the result and verbalized understanding.  All questions (if any) were answered. Jodi Porter, [place] 03/10/2021 1:52 PM    Upon patient request DME selection is [facility] Patient understands he will be contacted by [facility] to set up his cpap. Patient understands to call if [facility] does not contact him with new setup in a timely manner. Patient understands they will be called once confirmation has been received from [facility]/ that they have received their new machine to schedule 10 week follow up appointment.   [facility] notified of new cpap order  Please add to airview Patient was grateful for the call and thanked me.

## 2021-04-12 NOTE — Progress Notes (Signed)
Cardiology Office Note:    Date:  04/13/2021   ID:  Jodi Porter, DOB 03/26/1975, MRN [id number]  PCP:  Chesley Noon, MD  Cardiologist:  Sinclair Grooms, MD   Referring MD: Chesley Noon, MD   No chief complaint on file.   History of Present Illness:    Jodi Porter is a 46 y.o. female with a hx of smoking, gestational diabetes, diabetes mellitus type 2, obstructive sleep apnea, hyperlipidemia, and CAD with 10/26/2020 had aborted inferior myocardial infarction and found to have 50% mid and 60% distal RCA (Suspected ruptured plaque with thrombus resolution), and recurrent acute presentation with USAP re-evaluated with 11/18/2020 demonstrating improved anatomy.   Doing better.  She has discontinued ranolazine.  She lost her job at [employer] and has not been back since November.  She is not using sublingual nitroglycerin at all.  If she gets excited, she will occasionally get chest discomfort.  Still smoking cigarettes.  Has been smoking more recently because of losing her job and stress.  Past Medical History:  Diagnosis Date   Gestational diabetes    Gestational diabetes    Hypertension     Past Surgical History:  Procedure Laterality Date   CORONARY/GRAFT ACUTE MI REVASCULARIZATION N/A 10/26/2020   Procedure: Coronary/Graft Acute MI Revascularization;  Surgeon: Belva Crome, MD;  Location: [place] [facility];  Service: Cardiovascular;  Laterality: N/A;   ECTOPIC PREGNANCY SURGERY     INTRAVASCULAR PRESSURE WIRE/FFR STUDY N/A 11/18/2020   Procedure: INTRAVASCULAR PRESSURE WIRE/FFR STUDY;  Surgeon: Belva Crome, MD;  Location: [place] [facility];  Service: Cardiovascular;  Laterality: N/A;   LEFT HEART CATH AND CORONARY ANGIOGRAPHY N/A 10/26/2020   Procedure: LEFT HEART CATH AND CORONARY ANGIOGRAPHY;  Surgeon: Belva Crome, MD;  Location: [place] [facility];  Service: Cardiovascular;  Laterality: N/A;   LEFT HEART CATH AND CORONARY  ANGIOGRAPHY N/A 11/18/2020   Procedure: LEFT HEART CATH AND CORONARY ANGIOGRAPHY;  Surgeon: Belva Crome, MD;  Location: [place] [facility];  Service: Cardiovascular;  Laterality: N/A;    Current Medications: Current Meds  Medication Sig   amLODipine (NORVASC) 10 MG tablet Take 1 tablet (10 mg total) by mouth daily.   aspirin EC 81 MG tablet Take 81 mg by mouth as needed. Swallow whole.   atorvastatin (LIPITOR) 80 MG tablet Take 1 tablet (80 mg total) by mouth daily.   clopidogrel (PLAVIX) 75 MG tablet Take 1 tablet (75 mg total) by mouth daily with breakfast.   hydrochlorothiazide (MICROZIDE) 12.5 MG capsule Take 1 capsule (12.5 mg total) by mouth daily.   isosorbide mononitrate (IMDUR) 120 MG 24 hr tablet Take 1 tablet (120 mg total) by mouth daily.   levonorgestrel (MIRENA, 52 MG,) 20 MCG/DAY IUD Mirena 21 mcg/24 hours (8 yrs) 52 mg intrauterine device  to be inserted by provider for one dose   losartan (COZAAR) 25 MG tablet Take 1 tablet (25 mg total) by mouth daily.   metoprolol succinate (TOPROL-XL) 50 MG 24 hr tablet Take 50 mg by mouth daily.   nitroGLYCERIN (NITROSTAT) 0.4 MG SL tablet Place 1 tablet (0.4 mg total) under the tongue every 5 (five) minutes as needed.   [DISCONTINUED] ranolazine (RANEXA) 500 MG 12 hr tablet Take 1 tablet (500 mg total) by mouth 2 (two) times daily.     Allergies:   Penicillins and Tramadol   Social History   Socioeconomic History   Marital status: Single  Spouse name: Not on file   Number of children: Not on file   Years of education: Not on file   Highest education level: Not on file  Occupational History   Not on file  Tobacco Use   Smoking status: Every Day    Packs/day: 0.50    Types: Cigarettes   Smokeless tobacco: Never  Substance and Sexual Activity   Alcohol use: No   Drug use: No   Sexual activity: Yes    Birth control/protection: I.U.D.  Other Topics Concern   Not on file  Social History Narrative   Not on file    Social Determinants of Health   Financial Resource Strain: Not on file  Food Insecurity: Not on file  Transportation Needs: Not on file  Physical Activity: Not on file  Stress: Not on file  Social Connections: Not on file     Family History: The patient's family history is not on file.  ROS:   Please see the history of present illness.    No orthopnea, PND, or nitroglycerin use.  Discontinued ranolazine.  Compliant with other therapy.  All other systems reviewed and are negative.  EKGs/Labs/Other Studies Reviewed:    The following studies were reviewed today: 2D Doppler echocardiogram October 28, 2020: IMPRESSIONS     1. Left ventricular ejection fraction, by estimation, is 70 to 75%. The  left ventricle has hyperdynamic function. The left ventricle has no  regional wall motion abnormalities. Left ventricular diastolic parameters  are consistent with Grade I diastolic  dysfunction (impaired relaxation).   2. Right ventricular systolic function is normal. The right ventricular  size is normal.   3. The mitral valve is normal in structure. No evidence of mitral valve  regurgitation. No evidence of mitral stenosis.   4. Aortic valve is not well visualized. The increased transaortic  velocity may be secondary in part from hyperdynamic contraction. The  aortic valve was not well visualized. Aortic valve regurgitation is not  visualized. Moderate aortic valve stenosis.  Aortic valve mean gradient measures 18.3 mmHg. Aortic valve Vmax measures  3.03 m/s.   5. The inferior vena cava is normal in size with greater than 50%  respiratory variability, suggesting right atrial pressure of 3 mmHg.    EKG:  EKG a new tracing is not performed  Recent Labs: 10/26/2020: ALT 15 11/18/2020: BUN 9; Creatinine, Ser 1.21; Hemoglobin 14.0; Platelets 255; Potassium 3.3; Sodium 135  Recent Lipid Panel    Component Value Date/Time   CHOL 128 11/18/2020 0154   TRIG 94 11/18/2020 0154    HDL 34 (L) 11/18/2020 0154   CHOLHDL 3.8 11/18/2020 0154   VLDL 19 11/18/2020 0154   LDLCALC 75 11/18/2020 0154    Physical Exam:    VS:  BP (!) 122/92    Pulse 62    Ht '5\' 5"'$  (1.651 m)    Wt 233 lb 12.8 oz (106.1 kg)    BMI 38.91 kg/m     Wt Readings from Last 3 Encounters:  04/13/21 233 lb 12.8 oz (106.1 kg)  03/02/21 230 lb (104.3 kg)  12/30/20 232 lb (105.2 kg)     GEN: Overweight.. No acute distress HEENT: Normal NECK: No JVD. LYMPHATICS: No lymphadenopathy CARDIAC: Systolic murmur right upper sternal border without diastolic murmur. RRR no gallop, or edema. VASCULAR:  Normal Pulses. No bruits. RESPIRATORY:  Clear to auscultation without rales, wheezing or rhonchi  ABDOMEN: Soft, non-tender, non-distended, No pulsatile mass, MUSCULOSKELETAL: No deformity  SKIN: Warm and  dry NEUROLOGIC:  Alert and oriented x 3 PSYCHIATRIC:  Normal affect   ASSESSMENT:    1. Coronary artery disease of native artery of native heart with stable angina pectoris ([place])   2. Aortic valve stenosis, etiology of cardiac valve disease unspecified   3. OSA (obstructive sleep apnea)   4. Essential hypertension   5. Type 2 diabetes mellitus with complication, without long-term current use of insulin (HCC)   6. Other hyperlipidemia    PLAN:    In order of problems listed above:  Overall, symptoms are better controlled.  Still smoking cigarettes.  It is essential that she discontinue smoking if possible.  She understands but is having a difficult time.  Not willing to declare a discontinuation date.  Ranolazine has been discontinued as it is not being used.  I encouraged increased physical activity. The systolic murmur has been heard in the past and is related to hyperdynamic LV outflow and probable aortic valve disease. Encourage CPAP use Low-salt diet, continue Microzide, Norvasc, and Cozaar. Consider SGLT2 therapy. Continue high intensity Lipitor, 80 mg/day with target LDL less than  70.   Overall education and awareness concerning secondary risk prevention was discussed in detail: LDL less than 70, hemoglobin A1c less than 7, blood pressure target less than 130/80 mmHg, >150 minutes of moderate aerobic activity per week, avoidance of smoking, weight control (via diet and exercise), and continued surveillance/management of/for obstructive sleep apnea.    Medication Adjustments/Labs and Tests Ordered: Current medicines are reviewed at length with the patient today.  Concerns regarding medicines are outlined above.  No orders of the defined types were placed in this encounter.  No orders of the defined types were placed in this encounter.   Patient Instructions  Medication Instructions:  Your physician recommends that you continue on your current medications as directed. Please refer to the Current Medication list given to you today.  *If you need a refill on your cardiac medications before your next appointment, please call your pharmacy*   Lab Work: None If you have labs (blood work) drawn today and your tests are completely normal, you will receive your results only by: [place] (if you have MyChart) OR A paper copy in the mail If you have any lab test that is abnormal or we need to change your treatment, we will call you to review the results.   Testing/Procedures: None   Follow-Up: At [facility], you and your health needs are our priority.  As part of our continuing mission to provide you with exceptional heart care, we have created designated Provider Care Teams.  These Care Teams include your primary Cardiologist (physician) and Advanced Practice Providers (APPs -  Physician Assistants and Nurse Practitioners) who all work together to provide you with the care you need, when you need it.  We recommend signing up for the patient portal called "MyChart".  Sign up information is provided on this After Visit Summary.  MyChart is used to connect  with patients for Virtual Visits (Telemedicine).  Patients are able to view lab/test results, encounter notes, upcoming appointments, etc.  Non-urgent messages can be sent to your provider as well.   To learn more about what you can do with MyChart, go to [email or web address].    Your next appointment:   6 month(s)  The format for your next appointment:   In Person  Provider:   Sinclair Grooms, MD     Other Instructions     Signed, Lynnell Dike  Blenda Bridegroom, MD  04/13/2021 10:36 AM    [place]

## 2021-04-13 ENCOUNTER — Other Ambulatory Visit: Payer: Self-pay

## 2021-04-13 ENCOUNTER — Ambulatory Visit (INDEPENDENT_AMBULATORY_CARE_PROVIDER_SITE_OTHER): Admitting: Interventional Cardiology

## 2021-04-13 VITALS — BP 122/92 | HR 62 | Ht 65.0 in | Wt 233.8 lb

## 2021-04-13 DIAGNOSIS — I25118 Atherosclerotic heart disease of native coronary artery with other forms of angina pectoris: Secondary | ICD-10-CM | POA: Diagnosis not present

## 2021-04-13 DIAGNOSIS — G4733 Obstructive sleep apnea (adult) (pediatric): Secondary | ICD-10-CM | POA: Diagnosis not present

## 2021-04-13 DIAGNOSIS — I350 Nonrheumatic aortic (valve) stenosis: Secondary | ICD-10-CM | POA: Diagnosis not present

## 2021-04-13 DIAGNOSIS — I10 Essential (primary) hypertension: Secondary | ICD-10-CM

## 2021-04-13 DIAGNOSIS — E118 Type 2 diabetes mellitus with unspecified complications: Secondary | ICD-10-CM

## 2021-04-13 DIAGNOSIS — E7849 Other hyperlipidemia: Secondary | ICD-10-CM

## 2021-04-13 NOTE — Patient Instructions (Signed)
Medication Instructions:  ?Your physician recommends that you continue on your current medications as directed. Please refer to the Current Medication list given to you today. ? ?*If you need a refill on your cardiac medications before your next appointment, please call your pharmacy* ? ? ?Lab Work: ?None ?If you have labs (blood work) drawn today and your tests are completely normal, you will receive your results only by: ?MyChart Message (if you have MyChart) OR ?A paper copy in the mail ?If you have any lab test that is abnormal or we need to change your treatment, we will call you to review the results. ? ? ?Testing/Procedures: ?None ? ? ?Follow-Up: ?At [facility], you and your health needs are our priority.  As part of our continuing mission to provide you with exceptional heart care, we have created designated Provider Care Teams.  These Care Teams include your primary Cardiologist (physician) and Advanced Practice Providers (APPs -  Physician Assistants and Nurse Practitioners) who all work together to provide you with the care you need, when you need it. ? ?We recommend signing up for the patient portal called "MyChart".  Sign up information is provided on this After Visit Summary.  MyChart is used to connect with patients for Virtual Visits (Telemedicine).  Patients are able to view lab/test results, encounter notes, upcoming appointments, etc.  Non-urgent messages can be sent to your provider as well.   ?To learn more about what you can do with MyChart, go to [email or web address].   ? ?Your next appointment:   ?6 month(s) ? ?The format for your next appointment:   ?In Person ? ?Provider:   ?Henry W Smith III, MD  ? ? ?Other Instructions ?  ?

## 2021-04-29 ENCOUNTER — Other Ambulatory Visit: Payer: Self-pay | Admitting: Cardiology

## 2021-04-30 ENCOUNTER — Telehealth: Payer: Self-pay | Admitting: Interventional Cardiology

## 2021-04-30 NOTE — Telephone Encounter (Signed)
?*  STAT* If patient is at the pharmacy, call can be transferred to refill team. ? ? ?1. Which medications need to be refilled? (please list name of each medication and dose if known) new prescription for Isosorbide 120 mg ? ?2. Which pharmacy/location (including street and city if local pharmacy) is medication to be sent to?[facility]  [address] and [address], [employer] ? ?3. Do they need a 30 day or 90 day supply? 90 days and refills- please cal today, only 2 pills left ? ?

## 2021-05-01 MED ORDER — ISOSORBIDE MONONITRATE ER 120 MG PO TB24: 120.0000 mg | ORAL_TABLET | Freq: Every day | ORAL | 3 refills | Status: DC

## 2021-05-01 NOTE — Telephone Encounter (Signed)
Pt's medication was sent to pt's pharmacy as requested. Confirmation received.  °

## 2021-10-18 NOTE — Progress Notes (Deleted)
Cardiology Office Note:    Date:  10/18/2021   ID:  Jodi Porter, DOB 18-Jul-1975, MRN [id number]  PCP:  Chesley Noon, MD  Cardiologist:  Sinclair Grooms, MD   Referring MD: Chesley Noon, MD   No chief complaint on file.   History of Present Illness:    Jodi Porter is a 46 y.o. female with a hx of  smoking, gestational diabetes, diabetes mellitus type 2, obstructive sleep apnea, hyperlipidemia, and CAD with 10/26/2020 had aborted inferior myocardial infarction and found to have 50% mid and 60% distal RCA (Suspected ruptured plaque with thrombus resolution), and recurrent acute presentation with USAP re-evaluated with 11/18/2020 demonstrating improved anatomy.   ***  Past Medical History:  Diagnosis Date   Gestational diabetes    Gestational diabetes    Hypertension     Past Surgical History:  Procedure Laterality Date   CORONARY/GRAFT ACUTE MI REVASCULARIZATION N/A 10/26/2020   Procedure: Coronary/Graft Acute MI Revascularization;  Surgeon: Belva Crome, MD;  Location: [place] [facility];  Service: Cardiovascular;  Laterality: N/A;   ECTOPIC PREGNANCY SURGERY     INTRAVASCULAR PRESSURE WIRE/FFR STUDY N/A 11/18/2020   Procedure: INTRAVASCULAR PRESSURE WIRE/FFR STUDY;  Surgeon: Belva Crome, MD;  Location: [place] [facility];  Service: Cardiovascular;  Laterality: N/A;   LEFT HEART CATH AND CORONARY ANGIOGRAPHY N/A 10/26/2020   Procedure: LEFT HEART CATH AND CORONARY ANGIOGRAPHY;  Surgeon: Belva Crome, MD;  Location: [place] [facility];  Service: Cardiovascular;  Laterality: N/A;   LEFT HEART CATH AND CORONARY ANGIOGRAPHY N/A 11/18/2020   Procedure: LEFT HEART CATH AND CORONARY ANGIOGRAPHY;  Surgeon: Belva Crome, MD;  Location: [place] [facility];  Service: Cardiovascular;  Laterality: N/A;    Current Medications: No outpatient medications have been marked as taking for the 10/19/21 encounter (Appointment) with Belva Crome, MD.     Allergies:    Penicillins and Tramadol   Social History   Socioeconomic History   Marital status: Single    Spouse name: Not on file   Number of children: Not on file   Years of education: Not on file   Highest education level: Not on file  Occupational History   Not on file  Tobacco Use   Smoking status: Every Day    Packs/day: 0.50    Types: Cigarettes   Smokeless tobacco: Never  Substance and Sexual Activity   Alcohol use: No   Drug use: No   Sexual activity: Yes    Birth control/protection: I.U.D.  Other Topics Concern   Not on file  Social History Narrative   Not on file   Social Determinants of Health   Financial Resource Strain: Not on file  Food Insecurity: Not on file  Transportation Needs: Not on file  Physical Activity: Not on file  Stress: Not on file  Social Connections: Not on file     Family History: The patient's family history is not on file.  ROS:   Please see the history of present illness.    *** All other systems reviewed and are negative.  EKGs/Labs/Other Studies Reviewed:    The following studies were reviewed today: ***  EKG:  EKG ***  Recent Labs: 10/26/2020: ALT 15 11/18/2020: BUN 9; Creatinine, Ser 1.21; Hemoglobin 14.0; Platelets 255; Potassium 3.3; Sodium 135  Recent Lipid Panel    Component Value Date/Time   CHOL 128 11/18/2020 0154   TRIG 94 11/18/2020 0154   HDL 34 (  L) 11/18/2020 0154   CHOLHDL 3.8 11/18/2020 0154   VLDL 19 11/18/2020 0154   LDLCALC 75 11/18/2020 0154    Physical Exam:    VS:  There were no vitals taken for this visit.    Wt Readings from Last 3 Encounters:  04/13/21 233 lb 12.8 oz (106.1 kg)  03/02/21 230 lb (104.3 kg)  12/30/20 232 lb (105.2 kg)     GEN: ***. No acute distress HEENT: Normal NECK: No JVD. LYMPHATICS: No lymphadenopathy CARDIAC: *** murmur. RRR *** gallop, or edema. VASCULAR: *** Normal Pulses. No bruits. RESPIRATORY:  Clear to auscultation without rales, wheezing or rhonchi  ABDOMEN:  Soft, non-tender, non-distended, No pulsatile mass, MUSCULOSKELETAL: No deformity  SKIN: Warm and dry NEUROLOGIC:  Alert and oriented x 3 PSYCHIATRIC:  Normal affect   ASSESSMENT:    1. Coronary artery disease of native artery of native heart with stable angina pectoris ([place])   2. Aortic valve stenosis, etiology of cardiac valve disease unspecified   3. OSA (obstructive sleep apnea)   4. Essential hypertension   5. Type 2 diabetes mellitus with complication, without long-term current use of insulin (HCC)   6. Other hyperlipidemia    PLAN:    In order of problems listed above:  ***   Medication Adjustments/Labs and Tests Ordered: Current medicines are reviewed at length with the patient today.  Concerns regarding medicines are outlined above.  No orders of the defined types were placed in this encounter.  No orders of the defined types were placed in this encounter.   There are no Patient Instructions on file for this visit.   Signed, Sinclair Grooms, MD  10/18/2021 7:03 PM    [place]

## 2021-10-19 ENCOUNTER — Ambulatory Visit: Admitting: Interventional Cardiology

## 2021-10-19 DIAGNOSIS — E118 Type 2 diabetes mellitus with unspecified complications: Secondary | ICD-10-CM

## 2021-10-19 DIAGNOSIS — I25118 Atherosclerotic heart disease of native coronary artery with other forms of angina pectoris: Secondary | ICD-10-CM

## 2021-10-19 DIAGNOSIS — I10 Essential (primary) hypertension: Secondary | ICD-10-CM

## 2021-10-19 DIAGNOSIS — I350 Nonrheumatic aortic (valve) stenosis: Secondary | ICD-10-CM

## 2021-10-19 DIAGNOSIS — E7849 Other hyperlipidemia: Secondary | ICD-10-CM

## 2021-10-19 DIAGNOSIS — G4733 Obstructive sleep apnea (adult) (pediatric): Secondary | ICD-10-CM

## 2021-12-28 ENCOUNTER — Emergency Department (HOSPITAL_BASED_OUTPATIENT_CLINIC_OR_DEPARTMENT_OTHER)
Admission: EM | Admit: 2021-12-28 | Discharge: 2021-12-28 | Discharge status: Against Medical Advice | Attending: Emergency Medicine | Admitting: Emergency Medicine

## 2021-12-28 ENCOUNTER — Encounter (HOSPITAL_BASED_OUTPATIENT_CLINIC_OR_DEPARTMENT_OTHER): Payer: Self-pay | Admitting: Urology

## 2021-12-28 ENCOUNTER — Emergency Department (HOSPITAL_BASED_OUTPATIENT_CLINIC_OR_DEPARTMENT_OTHER)

## 2021-12-28 DIAGNOSIS — Z5321 Procedure and treatment not carried out due to patient leaving prior to being seen by health care provider: Secondary | ICD-10-CM | POA: Insufficient documentation

## 2021-12-28 DIAGNOSIS — M25511 Pain in right shoulder: Secondary | ICD-10-CM | POA: Diagnosis not present

## 2021-12-28 DIAGNOSIS — M542 Cervicalgia: Secondary | ICD-10-CM | POA: Insufficient documentation

## 2021-12-28 DIAGNOSIS — Y92410 Unspecified street and highway as the place of occurrence of the external cause: Secondary | ICD-10-CM | POA: Insufficient documentation

## 2021-12-28 NOTE — ED Notes (Signed)
HTN normal per pt, took meds today, no associated symptoms
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
## 2022-01-04 IMAGING — DX DG CHEST 1V PORT
1 series · 1 of 1 positions shown · non-contrast
Comparison: [DATE] [DATE], [DATE], [DATE] [DATE] [DATE]

CLINICAL DATA: cp chest pain

EXAM:
PORTABLE CHEST 1 VIEW

[chest ap]
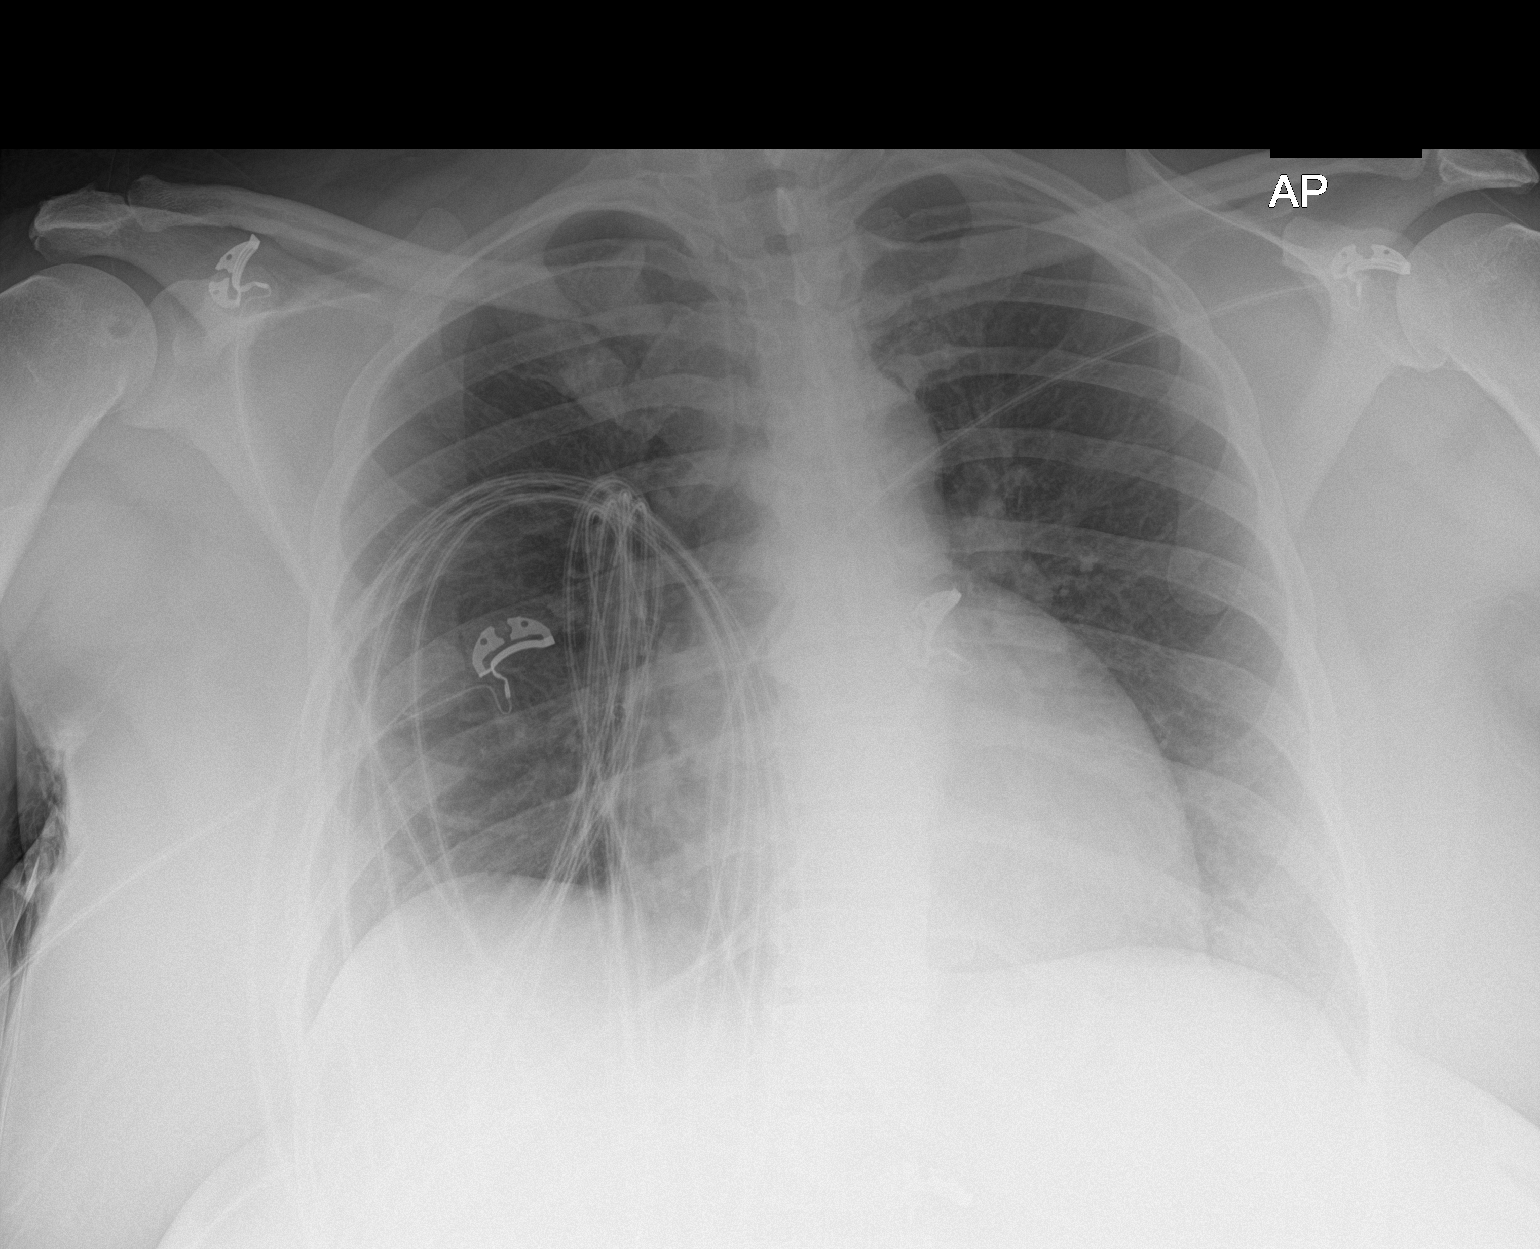

[1 of 1 positions shown; findings below may reference images not displayed]

FINDINGS: The cardiomediastinal silhouette is unchanged in contour. No pleural
effusion. No pneumothorax. No acute pleuroparenchymal abnormality.
Visualized abdomen is unremarkable. Lucency of the RIGHT humeral
head, likely degenerative in etiology.
IMPRESSION: No acute cardiopulmonary abnormality.

## 2022-01-10 ENCOUNTER — Other Ambulatory Visit: Payer: Self-pay | Admitting: Interventional Cardiology

## 2022-01-11 ENCOUNTER — Other Ambulatory Visit: Payer: Self-pay

## 2022-01-11 ENCOUNTER — Telehealth: Payer: Self-pay | Admitting: Interventional Cardiology

## 2022-01-11 MED ORDER — NITROGLYCERIN 0.4 MG SL SUBL: 0.4000 mg | SUBLINGUAL_TABLET | SUBLINGUAL | 2 refills | Status: DC | PRN

## 2022-01-11 NOTE — Telephone Encounter (Signed)
Pt's medication was already sent to pt's pharmacy. Confirmation received.

## 2022-01-11 NOTE — Telephone Encounter (Signed)
*  STAT* If patient is at the pharmacy, call can be transferred to refill team.   1. Which medications need to be refilled? (please list name of each medication and dose if known) nitroGLYCERIN (NITROSTAT) 0.4 MG SL tablet   2. Which pharmacy/location (including street and city if local pharmacy) is medication to be sent to? [id number] - [place], [place] - [address] AT [facility] [address]   3. Do they need a 30 day or 90 day supply? [place]

## 2022-01-11 NOTE — Telephone Encounter (Signed)
Pt's medication was sent to pt's pharmacy as requested. Confirmation received.  °

## 2022-02-22 NOTE — Progress Notes (Signed)
Cardiology Office Note:    Date:  03/02/2022   ID:  Jodi Porter, DOB 04-29-1975, MRN [id number]  PCP:  Chesley Noon, MD   [facility] Cardiologist:  Sinclair Grooms, MD     Referring MD: Chesley Noon, MD    History of Present Illness:    Jodi Porter is a 47 y.o. 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 Tamala Julian who now returns to clinic for follow-up.  Per review of the record, patient has history of inferior STEMI in 10/2020. Heart cath 10/26/20 with nonobstructive CAD without intervention. STEMI felt secondary to transient thrombosis with reperfusion vs coronary spasm.  Medical treatment was recommended and included DAPT with ASA and plavix, amlodipine, and BB. She re-presented in 11/2020 with recurrent chest pain. Cardiac cath with diffuse luminal irregularities in the RCA of 40 to 50% in the mid to distal vessel, 50% ostial PDA with RFR noted at 0.95. Continued medical management was recommended.  Last seen in clinic on 04/2021 where she was stable from a CV perspective. Was not needing nitro.  Today, the patient states that she is still having significant chest pain with activity. Has been taking nitro and imdur which is helping, but her symptoms can be overwhelming. Still struggling with having lost her job and inquiring about disability. Otherwise, no LE edema, orthopnea, PND or palpitations. Blood pressure remains elevated in 230s/100s today. States she is very sensitive to her blood pressure medications and has dropped her BP to the 90s with adjustments made in the past.  She is hesitant to adjust but is amenable for slow titration with frequent follow-up to ensure BP improves.    Past Medical History:  Diagnosis Date   Gestational diabetes    Gestational diabetes    Hypertension      Past Surgical History:  Procedure Laterality Date   CORONARY/GRAFT ACUTE MI REVASCULARIZATION N/A 10/26/2020   Procedure: Coronary/Graft Acute MI Revascularization;  Surgeon: Belva Crome, MD;  Location: [facility];  Service: Cardiovascular;  Laterality: N/A;   ECTOPIC PREGNANCY SURGERY     INTRAVASCULAR PRESSURE WIRE/FFR STUDY N/A 11/18/2020   Procedure: INTRAVASCULAR PRESSURE WIRE/FFR STUDY;  Surgeon: Belva Crome, MD;  Location: [facility];  Service: Cardiovascular;  Laterality: N/A;   LEFT HEART CATH AND CORONARY ANGIOGRAPHY N/A 10/26/2020   Procedure: LEFT HEART CATH AND CORONARY ANGIOGRAPHY;  Surgeon: Belva Crome, MD;  Location: [facility];  Service: Cardiovascular;  Laterality: N/A;   LEFT HEART CATH AND CORONARY ANGIOGRAPHY N/A 11/18/2020   Procedure: LEFT HEART CATH AND CORONARY ANGIOGRAPHY;  Surgeon: Belva Crome, MD;  Location: [facility];  Service: Cardiovascular;  Laterality: N/A;    Current Medications: Current Meds  Medication Sig   amLODipine (NORVASC) 10 MG tablet Take 1 tablet (10 mg total) by mouth daily.   aspirin EC 81 MG tablet Take 81 mg by mouth as needed. Swallow whole.   atorvastatin (LIPITOR) 80 MG tablet TAKE 1 TABLET(80 MG) BY MOUTH DAILY   clopidogrel (PLAVIX) 75 MG tablet TAKE 1 TABLET(75 MG) BY MOUTH DAILY WITH BREAKFAST   hydrochlorothiazide (MICROZIDE) 12.5 MG capsule TAKE 1 CAPSULE(12.5 MG) BY MOUTH DAILY   isosorbide mononitrate (IMDUR) 120 MG 24 hr tablet Take 1 tablet (120 mg total) by mouth daily.  levonorgestrel (MIRENA, 52 MG,) 20 MCG/DAY IUD Mirena 21 mcg/24 hours (8 yrs) 52 mg intrauterine device  to be inserted by provider for one dose   losartan (COZAAR) 50 MG tablet Take 1 tablet (50 mg total) by mouth daily.   metoprolol succinate (TOPROL-XL) 50 MG 24 hr tablet TAKE 1 TABLET(50 MG) BY MOUTH DAILY WITH OR IMMEDIATELY FOLLOWING A MEAL   nitroGLYCERIN (NITROSTAT) 0.4 MG SL tablet Place 1 tablet (0.4 mg  total) under the tongue every 5 (five) minutes as needed.   [DISCONTINUED] losartan (COZAAR) 25 MG tablet Take 1 tablet (25 mg total) by mouth daily.     Allergies:   Penicillins and Tramadol   Social History   Socioeconomic History   Marital status: Single    Spouse name: Not on file   Number of children: Not on file   Years of education: Not on file   Highest education level: Not on file  Occupational History   Not on file  Tobacco Use   Smoking status: Every Day    Packs/day: 0.50    Types: Cigarettes   Smokeless tobacco: Never  Substance and Sexual Activity   Alcohol use: No   Drug use: No   Sexual activity: Yes    Birth control/protection: I.U.D.  Other Topics Concern   Not on file  Social History Narrative   Not on file   Social Determinants of Health   Financial Resource Strain: Not on file  Food Insecurity: Not on file  Transportation Needs: Not on file  Physical Activity: Not on file  Stress: Not on file  Social Connections: Not on file     Family History: The patient's family history is not on file.  ROS:   Please see the history of present illness.     All other systems reviewed and are negative.  EKGs/Labs/Other Studies Reviewed:    The following studies were reviewed today: 2D Doppler echocardiogram October 28, 2020: IMPRESSIONS     1. Left ventricular ejection fraction, by estimation, is 70 to 75%. The  left ventricle has hyperdynamic function. The left ventricle has no  regional wall motion abnormalities. Left ventricular diastolic parameters  are consistent with Grade I diastolic  dysfunction (impaired relaxation).   2. Right ventricular systolic function is normal. The right ventricular  size is normal.   3. The mitral valve is normal in structure. No evidence of mitral valve  regurgitation. No evidence of mitral stenosis.   4. Aortic valve is not well visualized. The increased transaortic  velocity may be secondary in part from  hyperdynamic contraction. The  aortic valve was not well visualized. Aortic valve regurgitation is not  visualized. Moderate aortic valve stenosis.  Aortic valve mean gradient measures 18.3 mmHg. Aortic valve Vmax measures  3.03 m/s.   5. The inferior vena cava is normal in size with greater than 50%  respiratory variability, suggesting right atrial pressure of 3 mmHg.    LHC 11/2020: RECOMMENDATIONS:   The patient has clinical features of Prinzmetal's angina.  We will increase isosorbide mononitrate to 120 mg/day.  Decrease beta-blocker therapy to Toprol-XL 25 mg/day (could be exacerbating vasoconstriction and is certainly contributing to significant bradycardia).  Consider adding calcium channel blocker although I prefer angiotensin receptor blocker at this stage given glucose intolerance.  Liberal use of sublingual nitroglycerin as needed as an outpatient. Potential discharge later today.    EKG:  EKG is  ordered today.  The ekg ordered today demonstrates NSR with LVH, HR  70  Recent Labs: No results found for requested labs within last 365 days.  Recent Lipid Panel    Component Value Date/Time   CHOL 128 11/18/2020 0154   TRIG 94 11/18/2020 0154   HDL 34 (L) 11/18/2020 0154   CHOLHDL 3.8 11/18/2020 0154   VLDL 19 11/18/2020 0154   LDLCALC 75 11/18/2020 0154     Risk Assessment/Calculations:      HYPERTENSION CONTROL Vitals:   03/02/22 1625 03/02/22 1701  BP: (!) 235/107 (!) 200/97    The patient's blood pressure is elevated above target today.  In order to address the patient's elevated BP: A current anti-hypertensive medication was adjusted today.; A referral to the Pharm[facility] Hypertension Clinic will be placed.       STOP-Bang Score:          Physical Exam:    VS:  BP (!) 200/97   Pulse 70   Ht '5\' 5"'$  (1.651 m)   Wt 222 lb 12.8 oz (101.1 kg)   SpO2 98%   BMI 37.08 kg/m     Wt Readings from Last 3 Encounters:  03/02/22 222 lb 12.8 oz (101.1 kg)  12/28/21 233  lb 14.5 oz (106.1 kg)  04/13/21 233 lb 12.8 oz (106.1 kg)     GEN:  Well nourished, well developed in no acute distress HEENT: Normal NECK: No JVD; No carotid bruits CARDIAC: RRR, 2/6 systolic murmur RESPIRATORY:  Clear to auscultation without rales, wheezing or rhonchi  ABDOMEN: Soft, non-tender, non-distended MUSCULOSKELETAL:  No edema; No deformity  SKIN: Warm and dry NEUROLOGIC:  Alert and oriented x 3 PSYCHIATRIC:  Normal affect   ASSESSMENT:    1. Coronary artery disease of native artery of native heart with stable angina pectoris ([place])   2. Essential hypertension   3. Medication management   4. Coronary artery disease involving native coronary artery of native heart without angina pectoris   5. Cardiac microvascular disease   6. Pure hypercholesterolemia    PLAN:    In order of problems listed above:  #CAD with stable Angina: #Microvascular disease: Patient with history of inferior STEMI with nonobstructive disease on cath in 10/2020 thought to be ruptured plaque with thrombus resolution. Repeat cath 11/2020 with stable disease and no new lesions. Currently, with continued anginal symptoms in the setting of suspected microvascular disease and severely elevated blood pressures. Long discussion held today about medical management and the goal to get her blood pressure better to improve symptoms. She has a history of dropping her blood pressure significantly with med adjustments and therefore we will titrate slowly per patient preference. Will need regular follow-up for med titration. -Continue ASA '81mg'$  daily -Continue lipitor '80mg'$  daily -Continue imdur '120mg'$  daily -Continue metop '50mg'$  XL daily -Increase losartan to '50mg'$  daily -Nitro prn -BMET next week -Needs frequent follow-up for med titration for severely elevated blood pressures which are likely contributing to symptoms. Given history of rapid BP swings, will titrate slowly  #HTN: Very elevated on exam today. States  this is chronic and has been difficult to manage due to rapid BP swings with med titration in the past. Discussed this is likely contributing to her chest pain and significantly increases her risk of CVA and HF. She is willing for [place] to titrate meds slowly with frequent follow-ups so we can get her BP at a better level. Will increase losartan today and arrange for pharm D follow-up for further med titration. -Continue imdur '120mg'$  daily -Continue metop '50mg'$  XL daily -Increase  losartan to '50mg'$  daily -Continue HCTZ 12.'5mg'$  daily -Continue amlodipine '10mg'$  daily -Will arrange for Pharm D follow-up for medication titration; reassured her we will titrate slowly to ensure her body tolerates the med changes but goal is to get her to <130/80 to improve outcomes  #Elevated gradients across AoV: Elevated gradients on TTE 35mHg, but valve appears to open well on visual inspection with low suspicion for AS. Will monitor.  #HLD: Goal LDL<70 -Continue lipitor '80mg'$  daily  #Tobacco Abuse: -Cessation recommended             Medication Adjustments/Labs and Tests Ordered: Current medicines are reviewed at length with the patient today.  Concerns regarding medicines are outlined above.  Orders Placed This Encounter  Procedures   Basic metabolic panel   AMB Referral to [facility]   EKG 12-Lead   Meds ordered this encounter  Medications   losartan (COZAAR) 50 MG tablet    Sig: Take 1 tablet (50 mg total) by mouth daily.    Dispense:  90 tablet    Refill:  2    Dose increase    Patient Instructions  Medication Instructions:   INCREASE YOUR LOSARTAN TO 50 MG BY MOUTH DAILY  *If you need a refill on your cardiac medications before your next appointment, please call your pharmacy*    [facility]    Lab Work[place]  [facility]  If you have labs (blood work) drawn today and your  tests are completely normal, you will receive your results only by: [employer](if you have MyChart) OR A paper copy in the mail If you have any lab test that is abnormal or we need to change your treatment, we will call you to review the results.    Follow-Up:  6 WEEKS WITH AN EXTENDER IN THE OFFICE FOR MED MANAGEMENT--SCHEDULE WITH AN EXTENDER PER DR. PJohney Frame    Signed, HFreada Bergeron MD  03/02/2022 5:07 PM    [place]

## 2022-03-02 ENCOUNTER — Encounter: Payer: Self-pay | Admitting: Cardiology

## 2022-03-02 ENCOUNTER — Ambulatory Visit: Attending: Cardiology | Admitting: Cardiology

## 2022-03-02 VITALS — BP 200/97 | HR 70 | Ht 65.0 in | Wt 222.8 lb

## 2022-03-02 DIAGNOSIS — I25118 Atherosclerotic heart disease of native coronary artery with other forms of angina pectoris: Secondary | ICD-10-CM

## 2022-03-02 DIAGNOSIS — I10 Essential (primary) hypertension: Secondary | ICD-10-CM | POA: Diagnosis not present

## 2022-03-02 DIAGNOSIS — E7800 Pure hypercholesterolemia, unspecified: Secondary | ICD-10-CM

## 2022-03-02 DIAGNOSIS — I2510 Atherosclerotic heart disease of native coronary artery without angina pectoris: Secondary | ICD-10-CM

## 2022-03-02 DIAGNOSIS — Z79899 Other long term (current) drug therapy: Secondary | ICD-10-CM

## 2022-03-02 DIAGNOSIS — I2585 Chronic coronary microvascular dysfunction: Secondary | ICD-10-CM

## 2022-03-02 MED ORDER — LOSARTAN POTASSIUM 50 MG PO TABS: 50.0000 mg | ORAL_TABLET | Freq: Every day | ORAL | 2 refills | Status: DC

## 2022-03-02 NOTE — Patient Instructions (Signed)
Medication Instructions:   INCREASE YOUR LOSARTAN TO 50 MG BY MOUTH DAILY  *If you need a refill on your cardiac medications before your next appointment, please call your pharmacy*    You have been referred to [facility]    Lab Work:  [facility] OFFICE--BMET  If you have labs (blood work) drawn today and your tests are completely normal, you will receive your results only by: [employer] (if you have MyChart) OR A paper copy in the mail If you have any lab test that is abnormal or we need to change your treatment, we will call you to review the results.    Follow-Up:  6 WEEKS WITH AN EXTENDER IN THE OFFICE FOR MED MANAGEMENT--SCHEDULE WITH AN EXTENDER PER DR. Johney Frame

## 2022-03-09 ENCOUNTER — Ambulatory Visit: Attending: Cardiology

## 2022-03-09 DIAGNOSIS — I25118 Atherosclerotic heart disease of native coronary artery with other forms of angina pectoris: Secondary | ICD-10-CM

## 2022-03-09 DIAGNOSIS — I10 Essential (primary) hypertension: Secondary | ICD-10-CM

## 2022-03-09 DIAGNOSIS — Z79899 Other long term (current) drug therapy: Secondary | ICD-10-CM

## 2022-03-09 LAB — BASIC METABOLIC PANEL
BUN/Creatinine Ratio: 7 — ABNORMAL LOW (ref 9–23)
BUN: 9 mg/dL (ref 6–24)
CO2: 24 mmol/L (ref 20–29)
Calcium: 8.8 mg/dL (ref 8.7–10.2)
Chloride: 101 mmol/L (ref 96–106)
Creatinine, Ser: 1.26 mg/dL — ABNORMAL HIGH (ref 0.57–1.00)
Glucose: 161 mg/dL — ABNORMAL HIGH (ref 70–99)
Potassium: 4.2 mmol/L (ref 3.5–5.2)
Sodium: 138 mmol/L (ref 134–144)
eGFR: 53 mL/min/{1.73_m2} — ABNORMAL LOW (ref >59)

## 2022-03-10 ENCOUNTER — Other Ambulatory Visit

## 2022-04-09 ENCOUNTER — Ambulatory Visit: Attending: Cardiology

## 2022-04-09 NOTE — Progress Notes (Deleted)
Patient ID: KRYSTY HULKE                 DOB: 08/20/1975                      MRN: [id number]     HPI: NAZIRAH SCHUCHARDT is a 47 y.o. female referred by Dr. Johney Frame to HTN clinic. PMH is significant for CAD with aborted inferior MI (50-60% distal RCA; suspect ruptured plaque with thrombus resolution), HTN, tobacco use, gestational DM and now T2DM, CKD, Prinzmetal's angina, and OSA. BP very elevated at 200/97 at MD appt on 03/02/22. Pt reported being very sensitive to BP medications with drop to SBP in the 90s with adjustments made in the past. Amenable to slow titration of BP meds and losartan was increased to '50mg'$  daily. F/u BMET stable   Needs to stop smoking Get updated phone # for pt ivy couldn't reach with labs before Scr stable 1.26, K 4.2, [employer] losartan and or hctz can change to combo pill  Current HTN meds: amlodipine '10mg'$  daily, HCTZ 12.'5mg'$  daily, losartan '50mg'$  daily, Toprol '50mg'$  daily, Imdur '120mg'$  daily  BP goal: <130/66mHg  Family History: Not on file  Social History: 1/2 PPD  Diet:   Exercise:   Home BP readings:   Wt Readings from Last 3 Encounters:  03/02/22 222 lb 12.8 oz (101.1 kg)  12/28/21 233 lb 14.5 oz (106.1 kg)  04/13/21 233 lb 12.8 oz (106.1 kg)   BP Readings from Last 3 Encounters:  03/02/22 (!) 200/97  12/28/21 (!) 250/88  04/13/21 (!) 122/92   Pulse Readings from Last 3 Encounters:  03/02/22 70  12/28/21 62  04/13/21 62    Renal function: CrCl cannot be calculated (Patient's most recent lab result is older than the maximum 21 days allowed.).  Past Medical History:  Diagnosis Date   Gestational diabetes    Gestational diabetes    Hypertension     Current Outpatient Medications on File Prior to Visit  Medication Sig Dispense Refill   amLODipine (NORVASC) 10 MG tablet Take 1 tablet (10 mg total) by mouth daily. 90 tablet 1   aspirin EC 81 MG tablet Take 81 mg by mouth as needed. Swallow whole.     atorvastatin (LIPITOR) 80 MG  tablet TAKE 1 TABLET(80 MG) BY MOUTH DAILY 90 tablet 3   clopidogrel (PLAVIX) 75 MG tablet TAKE 1 TABLET(75 MG) BY MOUTH DAILY WITH BREAKFAST 90 tablet 3   hydrochlorothiazide (MICROZIDE) 12.5 MG capsule TAKE 1 CAPSULE(12.5 MG) BY MOUTH DAILY 90 capsule 3   isosorbide mononitrate (IMDUR) 120 MG 24 hr tablet Take 1 tablet (120 mg total) by mouth daily. 90 tablet 3   levonorgestrel (MIRENA, 52 MG,) 20 MCG/DAY IUD Mirena 21 mcg/24 hours (8 yrs) 52 mg intrauterine device  to be inserted by provider for one dose     losartan (COZAAR) 50 MG tablet Take 1 tablet (50 mg total) by mouth daily. 90 tablet 2   metoprolol succinate (TOPROL-XL) 50 MG 24 hr tablet TAKE 1 TABLET(50 MG) BY MOUTH DAILY WITH OR IMMEDIATELY FOLLOWING A MEAL 90 tablet 3   nitroGLYCERIN (NITROSTAT) 0.4 MG SL tablet Place 1 tablet (0.4 mg total) under the tongue every 5 (five) minutes as needed. 25 tablet 2   No current facility-administered medications on file prior to visit.    Allergies  Allergen Reactions   Penicillins Rash   Tramadol     Shake and get nauseated  Assessment/Plan:  1. Hypertension -

## 2022-04-15 NOTE — Progress Notes (Deleted)
Office Visit    Patient Name: Jodi Porter Date of Encounter: 04/15/2022  PCP:  Chesley Noon, MD   [place]  Cardiologist:  Freada Bergeron, MD  Advanced Practice Provider:  No care team member to display Electrophysiologist:  None   HPI    Jodi Porter is a 47 y.o. female with a past medical history of CAD with reported inferior MI (56% distal RCA, suspect ruptured plaque with thrombus resolution), tobacco use, gestational diabetes to diabetes mellitus type 2, OSA presents today for follow-up appointment.  Per review patient has history of inferior STEMI and 10/2020.  Heart catheterization 10/26/2020 with nonobstructive CAD without intervention.  STEMI felt secondary to transient thrombus with reperfusion versus coronary spasm.  Medical treatment was recommended including DAPT with ASA/Plavix, amlodipine, and beta-blocker.  She presented on 11/2020 with recurrent chest pain.  Cardiac catheterization with diffuse irregularities in the RCA 40 to 50% in the mid to distal vessel, 50% ostial PAD, continued medical management was recommended.  Was seen in the clinic March 2023 where she was stable from CV perspective.  Not needing any nitro.  She was last seen 03/02/2022 by Dr. Johney Frame and at that time was having significant chest pain with activity.  Had been taking nitro and Imdur which was helping.  Symptoms were overwhelming at the time.  Still struggling with having lost her job and inquiring about disability.  Otherwise, no lower extremity edema, orthopnea, PND, palpitations.  Blood pressure remain elevated 230s over 100s today.  States that she is very sensitive to her blood pressure medications and it has dropped into the 90s as well in the past.  Hesitant to make any adjustments.  Plan for slow titration with Requip follow-ups.  Today, she ***  Past Medical History    Past Medical History:  Diagnosis Date   Gestational diabetes     Gestational diabetes    Hypertension    Past Surgical History:  Procedure Laterality Date   CORONARY/GRAFT ACUTE MI REVASCULARIZATION N/A 10/26/2020   Procedure: Coronary/Graft Acute MI Revascularization;  Surgeon: Belva Crome, MD;  Location: [facility];  Service: Cardiovascular;  Laterality: N/A;   ECTOPIC PREGNANCY SURGERY     INTRAVASCULAR PRESSURE WIRE/FFR STUDY N/A 11/18/2020   Procedure: INTRAVASCULAR PRESSURE WIRE/FFR STUDY;  Surgeon: Belva Crome, MD;  Location: [facility];  Service: Cardiovascular;  Laterality: N/A;   LEFT HEART CATH AND CORONARY ANGIOGRAPHY N/A 10/26/2020   Procedure: LEFT HEART CATH AND CORONARY ANGIOGRAPHY;  Surgeon: Belva Crome, MD;  Location: [facility];  Service: Cardiovascular;  Laterality: N/A;   LEFT HEART CATH AND CORONARY ANGIOGRAPHY N/A 11/18/2020   Procedure: LEFT HEART CATH AND CORONARY ANGIOGRAPHY;  Surgeon: Belva Crome, MD;  Location: [facility];  Service: Cardiovascular;  Laterality: N/A;    Allergies  Allergies  Allergen Reactions   Penicillins Rash   Tramadol     Shake and get nauseated   EKGs/Labs/Other Studies Reviewed:   The following studies were reviewed today: 2D Doppler echocardiogram October 28, 2020: IMPRESSIONS     1. Left ventricular ejection fraction, by estimation, is 70 to 75%. The  left ventricle has hyperdynamic function. The left ventricle has no  regional wall motion abnormalities. Left ventricular diastolic parameters  are consistent with Grade I diastolic  dysfunction (impaired relaxation).   2. Right ventricular systolic function is normal. The right ventricular  size is normal.  3. The mitral valve is normal in structure. No evidence of mitral valve  regurgitation. No evidence of mitral stenosis.   4. Aortic valve is not well visualized. The increased transaortic  velocity may be secondary in part from hyperdynamic contraction. The  aortic valve was not well  visualized. Aortic valve regurgitation is not  visualized. Moderate aortic valve stenosis.  Aortic valve mean gradient measures 18.3 mmHg. Aortic valve Vmax measures  3.03 m/s.   5. The inferior vena cava is normal in size with greater than 50%  respiratory variability, suggesting right atrial pressure of 3 mmHg.    LHC 11/2020: RECOMMENDATIONS:   The patient has clinical features of Prinzmetal's angina.  We will increase isosorbide mononitrate to 120 mg/day.  Decrease beta-blocker therapy to Toprol-XL 25 mg/day (could be exacerbating vasoconstriction and is certainly contributing to significant bradycardia).  Consider adding calcium channel blocker although I prefer angiotensin receptor blocker at this stage given glucose intolerance.  Liberal use of sublingual nitroglycerin as needed as an outpatient  EKG:  EKG is *** ordered today.  The ekg ordered today demonstrates ***  Recent Labs: 03/09/2022: BUN 9; Creatinine, Ser 1.26; Potassium 4.2; Sodium 138  Recent Lipid Panel    Component Value Date/Time   CHOL 128 11/18/2020 0154   TRIG 94 11/18/2020 0154   HDL 34 (L) 11/18/2020 0154   CHOLHDL 3.8 11/18/2020 0154   VLDL 19 11/18/2020 0154   LDLCALC 75 11/18/2020 0154    Risk Assessment/Calculations:  {Does this patient have ATRIAL FIBRILLATION?[phone]}  Home Medications   No outpatient medications have been marked as taking for the 04/16/22 encounter (Appointment) with Elgie Collard, PA-C.     Review of Systems   ***   All other systems reviewed and are otherwise negative except as noted above.  Physical Exam    VS:  There were no vitals taken for this visit. , BMI There is no height or weight on file to calculate BMI.  Wt Readings from Last 3 Encounters:  03/02/22 222 lb 12.8 oz (101.1 kg)  12/28/21 233 lb 14.5 oz (106.1 kg)  04/13/21 233 lb 12.8 oz (106.1 kg)     GEN: Well nourished, well developed, in no acute distress. HEENT: normal. Neck: Supple, no JVD,  carotid bruits, or masses. Cardiac: ***RRR, no murmurs, rubs, or gallops. No clubbing, cyanosis, edema.  ***Radials/PT 2+ and equal bilaterally.  Respiratory:  ***Respirations regular and unlabored, clear to auscultation bilaterally. GI: Soft, nontender, nondistended. MS: No deformity or atrophy. Skin: Warm and dry, no rash. Neuro:  Strength and sensation are intact. Psych: Normal affect.  Assessment & Plan    Coronary artery disease/microvascular disease Hypertension Medical management Pure hypercholesterolemia  No BP recorded.  {Refresh Note OR Click here to enter BP  :1}***      Disposition: Follow up {follow up:15908} with Freada Bergeron, MD or APP.  Signed, Elgie Collard, PA-C 04/15/2022, 1:19 PM [facility]

## 2022-04-16 ENCOUNTER — Ambulatory Visit: Attending: Physician Assistant | Admitting: Physician Assistant

## 2022-04-16 DIAGNOSIS — Z79899 Other long term (current) drug therapy: Secondary | ICD-10-CM

## 2022-04-16 DIAGNOSIS — I2510 Atherosclerotic heart disease of native coronary artery without angina pectoris: Secondary | ICD-10-CM

## 2022-04-16 DIAGNOSIS — E7800 Pure hypercholesterolemia, unspecified: Secondary | ICD-10-CM

## 2022-04-16 DIAGNOSIS — I10 Essential (primary) hypertension: Secondary | ICD-10-CM

## 2022-04-16 DIAGNOSIS — I2585 Chronic coronary microvascular dysfunction: Secondary | ICD-10-CM

## 2022-04-16 DIAGNOSIS — E118 Type 2 diabetes mellitus with unspecified complications: Secondary | ICD-10-CM

## 2022-08-09 ENCOUNTER — Other Ambulatory Visit: Payer: Self-pay

## 2022-08-09 ENCOUNTER — Telehealth: Payer: Self-pay | Admitting: Cardiology

## 2022-08-09 DIAGNOSIS — I25118 Atherosclerotic heart disease of native coronary artery with other forms of angina pectoris: Secondary | ICD-10-CM

## 2022-08-09 DIAGNOSIS — Z79899 Other long term (current) drug therapy: Secondary | ICD-10-CM

## 2022-08-09 DIAGNOSIS — I10 Essential (primary) hypertension: Secondary | ICD-10-CM

## 2022-08-09 MED ORDER — NITROGLYCERIN 0.4 MG SL SUBL: 0.4000 mg | SUBLINGUAL_TABLET | SUBLINGUAL | 5 refills | Status: DC | PRN

## 2022-08-09 MED ORDER — LOSARTAN POTASSIUM 50 MG PO TABS: 50.0000 mg | ORAL_TABLET | Freq: Every day | ORAL | 1 refills | Status: DC

## 2022-08-09 MED ORDER — AMLODIPINE BESYLATE 10 MG PO TABS: 10.0000 mg | ORAL_TABLET | Freq: Every day | ORAL | 1 refills | Status: DC

## 2022-08-09 MED ORDER — ISOSORBIDE MONONITRATE ER 120 MG PO TB24: 120.0000 mg | ORAL_TABLET | Freq: Every day | ORAL | 1 refills | Status: DC

## 2022-08-09 NOTE — Telephone Encounter (Signed)
 *  STAT* If patient is at the pharmacy, call can be transferred to refill team.   1. Which medications need to be refilled? (please list name of each medication and dose if known)   isosorbide mononitrate (IMDUR) 120 MG 24 hr tablet  nitroGLYCERIN (NITROSTAT) 0.4 MG SL tablet  amLODipine (NORVASC) 10 MG tablet  losartan (COZAAR) 50 MG tablet    2. Which pharmacy/location (including street and city if local pharmacy) is medication to be sent to?  [id number] - [place], [place] - [address] AT [facility] [address]   3. Do they need a 30 day or 90 day supply? 90 days

## 2022-08-09 NOTE — Telephone Encounter (Signed)
Pt's medications were sent to pt's pharmacy as requested. Confirmation received.  

## 2022-10-06 ENCOUNTER — Ambulatory Visit: Payer: No Typology Code available for payment source | Admitting: Physician Assistant

## 2022-11-23 ENCOUNTER — Telehealth: Payer: Self-pay | Admitting: Cardiology

## 2022-11-23 MED ORDER — AMLODIPINE BESYLATE 10 MG PO TABS: 10.0000 mg | ORAL_TABLET | Freq: Every day | ORAL | 0 refills | Status: DC

## 2022-11-23 MED ORDER — ISOSORBIDE MONONITRATE ER 120 MG PO TB24: 120.0000 mg | ORAL_TABLET | Freq: Every day | ORAL | 0 refills | Status: DC

## 2022-11-23 NOTE — Telephone Encounter (Signed)
*  STAT* If patient is at the pharmacy, call can be transferred to refill team.   1. Which medications need to be refilled? (please list name of each medication and dose if known)   isosorbide mononitrate (IMDUR) 120 MG 24 hr tablet  amLODipine (NORVASC) 10 MG tablet   2. Which pharmacy/location (including street and city if local pharmacy) is medication to be sent to?  [id number] - [place], [place] - [address] AT [facility] [address]      3. Do they need a 30 day or 90 day supply? 90 day

## 2022-11-23 NOTE — Telephone Encounter (Signed)
Pt's medications were sent to pt's pharmacy as requested. Confirmation received.  

## 2023-01-08 NOTE — Progress Notes (Signed)
Cardiology Office Note:    Date:  01/10/2023   ID:  Jodi Porter, DOB 02-Nov-1975, MRN [id number]  PCP:  Jodi Inch, MD   [facility] Providers Cardiologist:  Jodi Sprague, MD (Inactive)     Referring MD: Jodi Inch, MD    History of Present Illness:    Jodi Porter is a 47 y.o. female with a hx of CAD, HTN tobacco use, gestational DM, T2DM, and OSA who was previously followed by Dr. Katrinka Porter and Jodi Porter  who now returns to clinic for follow-up.  Per review of the record, patient has history of inferior STEMI in 10/2020. Heart cath 10/26/20 with nonobstructive CAD (50 to 60% RCA; suspect ruptured plaque with thrombus resolution vs spasm)  The pt had no intervention without intervention. Medical treatment was recommended and included DAPT with ASA and plavix, amlodipine, and BB.  She re-presented in 11/2020 with recurrent chest pain. Cardiac cath with diffuse luminal irregularities in the RCA of 40 to 50% in the mid to distal vessel, 50% ostial PDA with RFR noted at 0.95. Continued medical management was recommended.   The pt previously seen in clinic by Jodi Porter, last in Jan 2024  BP was high at time   Patient reluctant to change   Said very sensitive to meds   BP 90s to 200s/    The pt says overall since seen she has not done well   Problems with daily activity  Gives out     She is a CNA but cannot keep up  Out of work     Says she has angina every night  Wakes up, ripping CPAP mask off  has to get up   Very SOB     Without CPAP she also has problems, stops breathing  BP very labile at home  has difficulty taking with current cuff  Pt says My Body is not good with medicine "   Past Medical History:  Diagnosis Date   Gestational diabetes    Gestational diabetes    Hypertension     Past Surgical History:  Procedure Laterality Date   CORONARY PRESSURE/FFR STUDY N/A 11/18/2020   Procedure: INTRAVASCULAR PRESSURE WIRE/FFR STUDY;   Surgeon: Jodi Records, MD;  Location: [facility] INVASIVE CV LAB;  Service: Cardiovascular;  Laterality: N/A;   CORONARY/GRAFT ACUTE MI REVASCULARIZATION N/A 10/26/2020   Procedure: Coronary/Graft Acute MI Revascularization;  Surgeon: Jodi Records, MD;  Location: [facility] INVASIVE CV LAB;  Service: Cardiovascular;  Laterality: N/A;   ECTOPIC PREGNANCY SURGERY     LEFT HEART CATH AND CORONARY ANGIOGRAPHY N/A 10/26/2020   Procedure: LEFT HEART CATH AND CORONARY ANGIOGRAPHY;  Surgeon: Jodi Records, MD;  Location: [facility] INVASIVE CV LAB;  Service: Cardiovascular;  Laterality: N/A;   LEFT HEART CATH AND CORONARY ANGIOGRAPHY N/A 11/18/2020   Procedure: LEFT HEART CATH AND CORONARY ANGIOGRAPHY;  Surgeon: Jodi Records, MD;  Location: [facility] INVASIVE CV LAB;  Service: Cardiovascular;  Laterality: N/A;    Current Medications: Current Meds  Medication Sig   amLODipine (NORVASC) 10 MG tablet Take 1 tablet (10 mg total) by mouth daily.   aspirin EC 81 MG tablet Take 81 mg by mouth as needed. Swallow whole.   atorvastatin (LIPITOR) 80 MG tablet TAKE 1 TABLET(80 MG) BY MOUTH DAILY   clopidogrel (PLAVIX) 75 MG tablet TAKE 1 TABLET(75 MG) BY MOUTH DAILY WITH BREAKFAST   hydrochlorothiazide (MICROZIDE) 12.5 MG capsule TAKE 1 CAPSULE(12.5 MG) BY MOUTH DAILY  isosorbide mononitrate (IMDUR) 120 MG 24 hr tablet Take 1 tablet (120 mg total) by mouth daily.   levonorgestrel (MIRENA, 52 MG,) 20 MCG/DAY IUD Mirena 21 mcg/24 hours (8 yrs) 52 mg intrauterine device  to be inserted by provider for one dose   losartan (COZAAR) 50 MG tablet Take 1 tablet (50 mg total) by mouth daily.   metoprolol succinate (TOPROL-XL) 50 MG 24 hr tablet TAKE 1 TABLET(50 MG) BY MOUTH DAILY WITH OR IMMEDIATELY FOLLOWING A MEAL   nitroGLYCERIN (NITROSTAT) 0.4 MG SL tablet Place 1 tablet (0.4 mg total) under the tongue every 5 (five) minutes as needed.     Allergies:   Penicillins and Tramadol   Social History   Socioeconomic History   Marital status:  Single    Spouse name: Not on file   Number of children: Not on file   Years of education: Not on file   Highest education level: Not on file  Occupational History   Not on file  Tobacco Use   Smoking status: Every Day    Current packs/day: 0.50    Types: Cigarettes   Smokeless tobacco: Never  Substance and Sexual Activity   Alcohol use: No   Drug use: No   Sexual activity: Yes    Birth control/protection: I.U.D.  Other Topics Concern   Not on file  Social History Narrative   Not on file   Social Determinants of Health   Financial Resource Strain: Not on file  Food Insecurity: No Food Insecurity (07/29/2020)   [facility], [facility]   Hunger Vital Sign    Worried About Running Out of Food in the Last Year: Never true    Ran Out of Food in the Last Year: Never true  Transportation Needs: Not on file  Physical Activity: Not on file  Stress: Not on file  Social Connections: Unknown (06/12/2021)   Received from [facility], [facility]   Social Network    Social Network: Not on file     ROS:   Please see the history of present illness.     All other systems reviewed and are negative.  EKGs/Labs/Other Studies Reviewed:    The following studies were reviewed today: 2D Doppler echocardiogram October 28, 2020: IMPRESSIONS     1. Left ventricular ejection fraction, by estimation, is 70 to 75%. The  left ventricle has hyperdynamic function. The left ventricle has no  regional wall motion abnormalities. Left ventricular diastolic parameters  are consistent with Grade I diastolic  dysfunction (impaired relaxation).   2. Right ventricular systolic function is normal. The right ventricular  size is normal.   3. The mitral valve is normal in structure. No evidence of mitral valve  regurgitation. No evidence of mitral stenosis.   4. Aortic valve is not well visualized. The increased transaortic  velocity may be secondary in part from hyperdynamic  contraction. The  aortic valve was not well visualized. Aortic valve regurgitation is not  visualized. Moderate aortic valve stenosis.  Aortic valve mean gradient measures 18.3 mmHg. Aortic valve Vmax measures  3.03 m/s.   5. The inferior vena cava is normal in size with greater than 50%  respiratory variability, suggesting right atrial pressure of 3 mmHg.    [facility] 11/2020: RECOMMENDATIONS:   The patient has clinical features of Prinzmetal's angina.  We will increase isosorbide mononitrate to 120 mg/day.  Decrease beta-blocker therapy to Toprol-XL 25 mg/day (could be exacerbating vasoconstriction and is certainly contributing to significant bradycardia).  Consider adding calcium channel blocker although I prefer angiotensin receptor blocker at this stage given glucose intolerance.  Liberal use of sublingual nitroglycerin as needed as an outpatient. Potential discharge later today.    EKG:  EKG is  ordered today.  The ekg ordered today demonstrates NSR with LVH, HR 70  Recent Labs: 03/09/2022: BUN 9; Creatinine, Ser 1.26; Potassium 4.2; Sodium 138  Recent Lipid Panel    Component Value Date/Time   CHOL 128 11/18/2020 0154   TRIG 94 11/18/2020 0154   HDL 34 (L) 11/18/2020 0154   CHOLHDL 3.8 11/18/2020 0154   VLDL 19 11/18/2020 0154   LDLCALC 75 11/18/2020 0154        Physical Exam:    VS:  BP (!) 160/98   Pulse (!) 59   Ht 5\' 5"  (1.651 m)   Wt 208 lb 6.4 oz (94.5 kg)   SpO2 98%   BMI 34.68 kg/m     Wt Readings from Last 3 Encounters:  01/10/23 208 lb 6.4 oz (94.5 kg)  03/02/22 222 lb 12.8 oz (101.1 kg)  12/28/21 233 lb 14.5 oz (106.1 kg)     GEN:  Well nourished, well developed in no acute distress HEENT: Normal NECK: No JVD; No carotid bruits CARDIAC: RRR, 2/6 systolic murmurLSB to base  RESPIRATORY:  Clear to auscultation without rales, wheezes ABDOMEN: Soft, non-tender, non-distended  No hepatomegaly  MUSCULOSKELETAL:  No LE edema;  \   PLAN:     1 CP  /SOB    Pt is having signficant change in symptoms over past year   Different from 2 years ago when cath done   Hx of mod dz then    ? Worsening CAD  ? Microvascular dz  ? Due to poorly controled BP  > due to AS   WOuld set up for a repeat R and L heart cath to define pressures and anatomy    Also with murmur sugg of AS  Will get echo to reevaluate LVEF and AV gradient    Risks /benefits of cath described  Pt understands and agrees to proceed .   2  CAD  As above   Last cath in Oct 2022   With worsening symptoms, repeat R / L heart cath    3  HTN: Pt reports very  labile BP   It is high today     -Continue imdur 120mg  daily -Continue metop 50mg  XL daily -Increase losartan to 50mg  daily -Continue HCTZ 12.5mg  daily -Continue amlodipine 10mg  daily After cath will arrange for pharmD appt     4  Murmur  I have reviewed echo images from 2022   AV appears to open well   She does have a mild gradient through this area  Will get repeat echo to reevaluate    5  HL  : Repeat lipomed     Last labs in 2022  6  OSA   Pt is not tolerating CPAP   Follow for now   Plan for cath   7  Tobacco Abuse: -Cessation recommended             Medication Adjustments/Labs and Tests Ordered: Current medicines are reviewed at length with the patient today.  Concerns regarding medicines are outlined above.  Orders Placed This Encounter  Procedures   Comprehensive metabolic panel   Pro b natriuretic peptide (BNP)   TSH   HgB A1c   NMR, lipoprofile   Apolipoprotein B   Lipoprotein A (LPA)  EKG 12-Lead   ECHOCARDIOGRAM COMPLETE   No orders of the defined types were placed in this encounter.   Patient Instructions  Medication Instructions:  Your physician recommends that you continue on your current medications as directed. Please refer to the Current Medication list given to you today.  *If you need a refill on your cardiac medications before your next appointment, please call your  pharmacy*   Lab Work: Please complete a CBC, BMET, TSH, Hemoglobin A1c, and NMR panel today at the [facility] on the first floor of our building.  If you have labs (blood work) drawn today and your tests are completely normal, you will receive your results only by: MyChart Message (if you have MyChart) OR A paper copy in the mail If you have any lab test that is abnormal or we need to change your treatment, we will call you to review the results.   Testing/Procedures: Your physician has requested that you have an echocardiogram. Echocardiography is a painless test that uses sound waves to create images of your heart. It provides your doctor with information about the size and shape of your heart and how well your heart's chambers and valves are working. This procedure takes approximately one hour. There are no restrictions for this procedure. Please do NOT wear cologne, perfume, aftershave, or lotions (deodorant is allowed). Please arrive 15 minutes prior to your appointment time.  Please note: We ask at that you not bring children with you during ultrasound (echo/ vascular) testing. Due to room size and safety concerns, children are not allowed in the ultrasound rooms during exams. Our front office staff cannot provide observation of children in our lobby area while testing is being conducted. An adult accompanying a patient to their appointment will only be allowed in the ultrasound room at the discretion of the ultrasound technician under special circumstances. We apologize for any inconvenience.        Cardiac/Peripheral Catheterization   You are scheduled for a Cardiac Catheterization on Tuesday, December 17 with Dr. Tonny Bollman.  1. Please arrive at the [facility] (Main Entrance A) at [facility]: [address] [place], [place] [address] at 5:30 AM (This time is 2 hour(s) before your procedure to ensure your preparation).   Free valet parking service is available. You  will check in at ADMITTING. The support person will be asked to wait in the waiting room.  It is OK to have someone drop you off and come back when you are ready to be discharged.        Special note: Every effort is made to have your procedure done on time. Please understand that emergencies sometimes delay scheduled procedures.  2. Diet: Do not eat solid foods after midnight.  You may have clear liquids until 5 AM the day of the procedure.  3. Labs: You will need to have blood drawn on Monday, December 9 at [facility]. [address], [place]  Open: 7:30am - 5pm    Phone: [phone]. You do not need to be fasting.  4. Medication instructions in preparation for your procedure:   Contrast Allergy: No   Stop taking, HTCZ (Hydrochlorothiazide) Tuesday, December 17,2024. You may resume this medication after your heart catheterization.    On the morning of your procedure, take Aspirin 81 mg and Plavix/Clopidogrel and any morning medicines NOT listed above.  You may use sips of water.  5. Plan to go home the same day,  you will only stay overnight if medically necessary. 6. You MUST have a responsible adult to drive you home. 7. An adult MUST be with you the first 24 hours after you arrive home. 8. Bring a current list of your medications, and the last time and date medication taken. 9. Bring ID and current insurance cards. 10.Please wear clothes that are easy to get on and off and wear slip-on shoes.  Thank you for allowing [place] to care for you!   -- [facility]    Follow-Up: At [facility], you and your health needs are our priority.  As part of our continuing mission to provide you with exceptional heart care, we have created designated Provider Care Teams.  These Care Teams include your primary Cardiologist (physician) and Advanced Practice Providers (APPs -  Physician Assistants and Nurse  Practitioners) who all work together to provide you with the care you need, when you need it.  We recommend signing up for the patient portal called "MyChart".  Sign up information is provided on this After Visit Summary.  MyChart is used to connect with patients for Virtual Visits (Telemedicine).  Patients are able to view lab/test results, encounter notes, upcoming appointments, etc.  Non-urgent messages can be sent to your provider as well.   To learn more about what you can do with MyChart, go to [email or web address].    Your next appointment will be dependent on the results of your testing and it will be with:    Provider:   Dr. Dietrich Pates, MD      Signed, Dietrich Pates, MD  01/10/2023 5:25 PM    [facility]

## 2023-01-08 NOTE — H&P (View-Only) (Signed)
Cardiology Office Note:    Date:  01/10/2023   ID:  Jodi Porter, DOB 08-21-1975, MRN [id number]  PCP:  Eartha Inch, MD   [facility] Providers Cardiologist:  Meriam Sprague, MD (Inactive)     Referring MD: Eartha Inch, MD    History of Present Illness:    Jodi Porter is a 47 y.o. female with a hx of CAD, HTN tobacco use, gestational DM, T2DM, and OSA who was previously followed by Dr. Katrinka Blazing and Jon Billings  who now returns to clinic for follow-up.  Per review of the record, patient has history of inferior STEMI in 10/2020. Heart cath 10/26/20 with nonobstructive CAD (50 to 60% RCA; suspect ruptured plaque with thrombus resolution vs spasm)  The pt had no intervention without intervention. Medical treatment was recommended and included DAPT with ASA and plavix, amlodipine, and BB.  She re-presented in 11/2020 with recurrent chest pain. Cardiac cath with diffuse luminal irregularities in the RCA of 40 to 50% in the mid to distal vessel, 50% ostial PDA with RFR noted at 0.95. Continued medical management was recommended.   The pt previously seen in clinic by Jon Billings, last in Jan 2024  BP was high at time   Patient reluctant to change   Said very sensitive to meds   BP 90s to 200s/    The pt says overall since seen she has not done well   Problems with daily activity  Gives out     She is a CNA but cannot keep up  Out of work     Says she has angina every night  Wakes up, ripping CPAP mask off  has to get up   Very SOB     Without CPAP she also has problems, stops breathing  BP very labile at home  has difficulty taking with current cuff  Pt says My Body is not good with medicine "   Past Medical History:  Diagnosis Date   Gestational diabetes    Gestational diabetes    Hypertension     Past Surgical History:  Procedure Laterality Date   CORONARY PRESSURE/FFR STUDY N/A 11/18/2020   Procedure: INTRAVASCULAR PRESSURE WIRE/FFR STUDY;   Surgeon: Lyn Records, MD;  Location: [facility] INVASIVE CV LAB;  Service: Cardiovascular;  Laterality: N/A;   CORONARY/GRAFT ACUTE MI REVASCULARIZATION N/A 10/26/2020   Procedure: Coronary/Graft Acute MI Revascularization;  Surgeon: Lyn Records, MD;  Location: [facility] INVASIVE CV LAB;  Service: Cardiovascular;  Laterality: N/A;   ECTOPIC PREGNANCY SURGERY     LEFT HEART CATH AND CORONARY ANGIOGRAPHY N/A 10/26/2020   Procedure: LEFT HEART CATH AND CORONARY ANGIOGRAPHY;  Surgeon: Lyn Records, MD;  Location: [facility] INVASIVE CV LAB;  Service: Cardiovascular;  Laterality: N/A;   LEFT HEART CATH AND CORONARY ANGIOGRAPHY N/A 11/18/2020   Procedure: LEFT HEART CATH AND CORONARY ANGIOGRAPHY;  Surgeon: Lyn Records, MD;  Location: [facility] INVASIVE CV LAB;  Service: Cardiovascular;  Laterality: N/A;    Current Medications: Current Meds  Medication Sig   amLODipine (NORVASC) 10 MG tablet Take 1 tablet (10 mg total) by mouth daily.   aspirin EC 81 MG tablet Take 81 mg by mouth as needed. Swallow whole.   atorvastatin (LIPITOR) 80 MG tablet TAKE 1 TABLET(80 MG) BY MOUTH DAILY   clopidogrel (PLAVIX) 75 MG tablet TAKE 1 TABLET(75 MG) BY MOUTH DAILY WITH BREAKFAST   hydrochlorothiazide (MICROZIDE) 12.5 MG capsule TAKE 1 CAPSULE(12.5 MG) BY MOUTH DAILY  isosorbide mononitrate (IMDUR) 120 MG 24 hr tablet Take 1 tablet (120 mg total) by mouth daily.   levonorgestrel (MIRENA, 52 MG,) 20 MCG/DAY IUD Mirena 21 mcg/24 hours (8 yrs) 52 mg intrauterine device  to be inserted by provider for one dose   losartan (COZAAR) 50 MG tablet Take 1 tablet (50 mg total) by mouth daily.   metoprolol succinate (TOPROL-XL) 50 MG 24 hr tablet TAKE 1 TABLET(50 MG) BY MOUTH DAILY WITH OR IMMEDIATELY FOLLOWING A MEAL   nitroGLYCERIN (NITROSTAT) 0.4 MG SL tablet Place 1 tablet (0.4 mg total) under the tongue every 5 (five) minutes as needed.     Allergies:   Penicillins and Tramadol   Social History   Socioeconomic History   Marital status:  Single    Spouse name: Not on file   Number of children: Not on file   Years of education: Not on file   Highest education level: Not on file  Occupational History   Not on file  Tobacco Use   Smoking status: Every Day    Current packs/day: 0.50    Types: Cigarettes   Smokeless tobacco: Never  Substance and Sexual Activity   Alcohol use: No   Drug use: No   Sexual activity: Yes    Birth control/protection: I.U.D.  Other Topics Concern   Not on file  Social History Narrative   Not on file   Social Determinants of Health   Financial Resource Strain: Not on file  Food Insecurity: No Food Insecurity (07/29/2020)   Received from [facility], [facility]   Hunger Vital Sign    Worried About Running Out of Food in the Last Year: Never true    Ran Out of Food in the Last Year: Never true  Transportation Needs: Not on file  Physical Activity: Not on file  Stress: Not on file  Social Connections: Unknown (06/12/2021)   Received from [facility], [facility]   Social Network    Social Network: Not on file     ROS:   Please see the history of present illness.     All other systems reviewed and are negative.  EKGs/Labs/Other Studies Reviewed:    The following studies were reviewed today: 2D Doppler echocardiogram October 28, 2020: IMPRESSIONS     1. Left ventricular ejection fraction, by estimation, is 70 to 75%. The  left ventricle has hyperdynamic function. The left ventricle has no  regional wall motion abnormalities. Left ventricular diastolic parameters  are consistent with Grade I diastolic  dysfunction (impaired relaxation).   2. Right ventricular systolic function is normal. The right ventricular  size is normal.   3. The mitral valve is normal in structure. No evidence of mitral valve  regurgitation. No evidence of mitral stenosis.   4. Aortic valve is not well visualized. The increased transaortic  velocity may be secondary in part from hyperdynamic  contraction. The  aortic valve was not well visualized. Aortic valve regurgitation is not  visualized. Moderate aortic valve stenosis.  Aortic valve mean gradient measures 18.3 mmHg. Aortic valve Vmax measures  3.03 m/s.   5. The inferior vena cava is normal in size with greater than 50%  respiratory variability, suggesting right atrial pressure of 3 mmHg.    [facility] 11/2020: RECOMMENDATIONS:   The patient has clinical features of Prinzmetal's angina.  We will increase isosorbide mononitrate to 120 mg/day.  Decrease beta-blocker therapy to Toprol-XL 25 mg/day (could be exacerbating vasoconstriction and is certainly contributing to significant bradycardia).  Consider adding calcium channel blocker although I prefer angiotensin receptor blocker at this stage given glucose intolerance.  Liberal use of sublingual nitroglycerin as needed as an outpatient. Potential discharge later today.    EKG:  EKG is  ordered today.  The ekg ordered today demonstrates NSR with LVH, HR 70  Recent Labs: 03/09/2022: BUN 9; Creatinine, Ser 1.26; Potassium 4.2; Sodium 138  Recent Lipid Panel    Component Value Date/Time   CHOL 128 11/18/2020 0154   TRIG 94 11/18/2020 0154   HDL 34 (L) 11/18/2020 0154   CHOLHDL 3.8 11/18/2020 0154   VLDL 19 11/18/2020 0154   LDLCALC 75 11/18/2020 0154        Physical Exam:    VS:  BP (!) 160/98   Pulse (!) 59   Ht 5\' 5"  (1.651 m)   Wt 208 lb 6.4 oz (94.5 kg)   SpO2 98%   BMI 34.68 kg/m     Wt Readings from Last 3 Encounters:  01/10/23 208 lb 6.4 oz (94.5 kg)  03/02/22 222 lb 12.8 oz (101.1 kg)  12/28/21 233 lb 14.5 oz (106.1 kg)     GEN:  Well nourished, well developed in no acute distress HEENT: Normal NECK: No JVD; No carotid bruits CARDIAC: RRR, 2/6 systolic murmurLSB to base  RESPIRATORY:  Clear to auscultation without rales, wheezes ABDOMEN: Soft, non-tender, non-distended  No hepatomegaly  MUSCULOSKELETAL:  No LE edema;  \   PLAN:     1 CP  /SOB    Pt is having signficant change in symptoms over past year   Different from 2 years ago when cath done   Hx of mod dz then    ? Worsening CAD  ? Microvascular dz  ? Due to poorly controled BP  > due to AS   WOuld set up for a repeat R and L heart cath to define pressures and anatomy    Also with murmur sugg of AS  Will get echo to reevaluate LVEF and AV gradient    Risks /benefits of cath described  Pt understands and agrees to proceed .   2  CAD  As above   Last cath in Oct 2022   With worsening symptoms, repeat R / L heart cath    3  HTN: Pt reports very  labile BP   It is high today     -Continue imdur 120mg  daily -Continue metop 50mg  XL daily -Increase losartan to 50mg  daily -Continue HCTZ 12.5mg  daily -Continue amlodipine 10mg  daily After cath will arrange for pharmD appt     4  Murmur  I have reviewed echo images from 2022   AV appears to open well   She does have a mild gradient through this area  Will get repeat echo to reevaluate    5  HL  : Repeat lipomed     Last labs in 2022  6  OSA   Pt is not tolerating CPAP   Follow for now   Plan for cath   7  Tobacco Abuse: -Cessation recommended             Medication Adjustments/Labs and Tests Ordered: Current medicines are reviewed at length with the patient today.  Concerns regarding medicines are outlined above.  Orders Placed This Encounter  Procedures   Comprehensive metabolic panel   Pro b natriuretic peptide (BNP)   TSH   HgB A1c   NMR, lipoprofile   Apolipoprotein B   Lipoprotein A (LPA)  EKG 12-Lead   ECHOCARDIOGRAM COMPLETE   No orders of the defined types were placed in this encounter.   Patient Instructions  Medication Instructions:  Your physician recommends that you continue on your current medications as directed. Please refer to the Current Medication list given to you today.  *If you need a refill on your cardiac medications before your next appointment, please call your  pharmacy*   Lab Work: Please complete a CBC, BMET, TSH, Hemoglobin A1c, and NMR panel today at the [facility] on the first floor of our building.  If you have labs (blood work) drawn today and your tests are completely normal, you will receive your results only by: MyChart Message (if you have MyChart) OR A paper copy in the mail If you have any lab test that is abnormal or we need to change your treatment, we will call you to review the results.   Testing/Procedures: Your physician has requested that you have an echocardiogram. Echocardiography is a painless test that uses sound waves to create images of your heart. It provides your doctor with information about the size and shape of your heart and how well your heart's chambers and valves are working. This procedure takes approximately one hour. There are no restrictions for this procedure. Please do NOT wear cologne, perfume, aftershave, or lotions (deodorant is allowed). Please arrive 15 minutes prior to your appointment time.  Please note: We ask at that you not bring children with you during ultrasound (echo/ vascular) testing. Due to room size and safety concerns, children are not allowed in the ultrasound rooms during exams. Our front office staff cannot provide observation of children in our lobby area while testing is being conducted. An adult accompanying a patient to their appointment will only be allowed in the ultrasound room at the discretion of the ultrasound technician under special circumstances. We apologize for any inconvenience.        Cardiac/Peripheral Catheterization   You are scheduled for a Cardiac Catheterization on Tuesday, December 17 with Dr. Tonny Bollman.  1. Please arrive at the [facility] (Main Entrance A) at [facility]: [address] [place], [place] [address] at 5:30 AM (This time is 2 hour(s) before your procedure to ensure your preparation).   Free valet parking service is available. You  will check in at ADMITTING. The support person will be asked to wait in the waiting room.  It is OK to have someone drop you off and come back when you are ready to be discharged.        Special note: Every effort is made to have your procedure done on time. Please understand that emergencies sometimes delay scheduled procedures.  2. Diet: Do not eat solid foods after midnight.  You may have clear liquids until 5 AM the day of the procedure.  3. Labs: You will need to have blood drawn on Monday, December 9 at [facility] at [facility]. [address], [place]  Open: 7:30am - 5pm    Phone: [phone]. You do not need to be fasting.  4. Medication instructions in preparation for your procedure:   Contrast Allergy: No   Stop taking, HTCZ (Hydrochlorothiazide) Tuesday, December 17,2024. You may resume this medication after your heart catheterization.    On the morning of your procedure, take Aspirin 81 mg and Plavix/Clopidogrel and any morning medicines NOT listed above.  You may use sips of water.  5. Plan to go home the same day,  you will only stay overnight if medically necessary. 6. You MUST have a responsible adult to drive you home. 7. An adult MUST be with you the first 24 hours after you arrive home. 8. Bring a current list of your medications, and the last time and date medication taken. 9. Bring ID and current insurance cards. 10.Please wear clothes that are easy to get on and off and wear slip-on shoes.  Thank you for allowing [place] to care for you!   -- [place] [facility]    Follow-Up: At [facility], you and your health needs are our priority.  As part of our continuing mission to provide you with exceptional heart care, we have created designated Provider Care Teams.  These Care Teams include your primary Cardiologist (physician) and Advanced Practice Providers (APPs -  Physician Assistants and Nurse  Practitioners) who all work together to provide you with the care you need, when you need it.  We recommend signing up for the patient portal called "MyChart".  Sign up information is provided on this After Visit Summary.  MyChart is used to connect with patients for Virtual Visits (Telemedicine).  Patients are able to view lab/test results, encounter notes, upcoming appointments, etc.  Non-urgent messages can be sent to your provider as well.   To learn more about what you can do with MyChart, go to [email or web address].    Your next appointment will be dependent on the results of your testing and it will be with:    Provider:   Dr. Dietrich Pates, MD      Signed, Dietrich Pates, MD  01/10/2023 5:25 PM     [facility]

## 2023-01-10 ENCOUNTER — Encounter: Payer: Self-pay | Admitting: Internal Medicine

## 2023-01-10 ENCOUNTER — Ambulatory Visit: Attending: Internal Medicine | Admitting: Internal Medicine

## 2023-01-10 VITALS — BP 160/98 | HR 59 | Ht 65.0 in | Wt 208.4 lb

## 2023-01-10 DIAGNOSIS — R0602 Shortness of breath: Secondary | ICD-10-CM | POA: Diagnosis not present

## 2023-01-10 DIAGNOSIS — I10 Essential (primary) hypertension: Secondary | ICD-10-CM

## 2023-01-10 DIAGNOSIS — R011 Cardiac murmur, unspecified: Secondary | ICD-10-CM

## 2023-01-10 DIAGNOSIS — Z79899 Other long term (current) drug therapy: Secondary | ICD-10-CM

## 2023-01-10 DIAGNOSIS — E785 Hyperlipidemia, unspecified: Secondary | ICD-10-CM

## 2023-01-10 DIAGNOSIS — I350 Nonrheumatic aortic (valve) stenosis: Secondary | ICD-10-CM | POA: Diagnosis not present

## 2023-01-10 DIAGNOSIS — F17210 Nicotine dependence, cigarettes, uncomplicated: Secondary | ICD-10-CM

## 2023-01-10 DIAGNOSIS — G4733 Obstructive sleep apnea (adult) (pediatric): Secondary | ICD-10-CM

## 2023-01-10 DIAGNOSIS — Z01812 Encounter for preprocedural laboratory examination: Secondary | ICD-10-CM

## 2023-01-10 DIAGNOSIS — R0789 Other chest pain: Secondary | ICD-10-CM | POA: Diagnosis not present

## 2023-01-10 DIAGNOSIS — I2510 Atherosclerotic heart disease of native coronary artery without angina pectoris: Secondary | ICD-10-CM | POA: Diagnosis not present

## 2023-01-10 NOTE — Patient Instructions (Addendum)
Medication Instructions:  Your physician recommends that you continue on your current medications as directed. Please refer to the Current Medication list given to you today.  *If you need a refill on your cardiac medications before your next appointment, please call your pharmacy*   Lab Work: Please complete a CBC, BMET, TSH, Hemoglobin A1c, and NMR panel today at the [facility] on the first floor of our building.  If you have labs (blood work) drawn today and your tests are completely normal, you will receive your results only by: MyChart Message (if you have MyChart) OR A paper copy in the mail If you have any lab test that is abnormal or we need to change your treatment, we will call you to review the results.   Testing/Procedures: Your physician has requested that you have an echocardiogram. Echocardiography is a painless test that uses sound waves to create images of your heart. It provides your doctor with information about the size and shape of your heart and how well your heart's chambers and valves are working. This procedure takes approximately one hour. There are no restrictions for this procedure. Please do NOT wear cologne, perfume, aftershave, or lotions (deodorant is allowed). Please arrive 15 minutes prior to your appointment time.  Please note: We ask at that you not bring children with you during ultrasound (echo/ vascular) testing. Due to room size and safety concerns, children are not allowed in the ultrasound rooms during exams. Our front office staff cannot provide observation of children in our lobby area while testing is being conducted. An adult accompanying a patient to their appointment will only be allowed in the ultrasound room at the discretion of the ultrasound technician under special circumstances. We apologize for any inconvenience.        Cardiac/Peripheral Catheterization   You are scheduled for a Cardiac Catheterization on Tuesday, December 17 with Dr.  Tonny Bollman.  1. Please arrive at the [facility] (Main Entrance A) at [facility]: [address] [place], [place] [address] at 5:30 AM (This time is 2 hour(s) before your procedure to ensure your preparation).   Free valet parking service is available. You will check in at ADMITTING. The support person will be asked to wait in the waiting room.  It is OK to have someone drop you off and come back when you are ready to be discharged.        Special note: Every effort is made to have your procedure done on time. Please understand that emergencies sometimes delay scheduled procedures.  2. Diet: Do not eat solid foods after midnight.  You may have clear liquids until 5 AM the day of the procedure.  3. Labs: You will need to have blood drawn on Monday, December 9 at [facility] at [facility]. [address], [place]  Open: 7:30am - 5pm    Phone: [phone]. You do not need to be fasting.  4. Medication instructions in preparation for your procedure:   Contrast Allergy: No   Stop taking, HTCZ (Hydrochlorothiazide) Tuesday, December 17,2024. You may resume this medication after your heart catheterization.    On the morning of your procedure, take Aspirin 81 mg and Plavix/Clopidogrel and any morning medicines NOT listed above.  You may use sips of water.  5. Plan to go home the same day, you will only stay overnight if medically necessary. 6. You MUST have a responsible adult to drive you home. 7. An adult MUST be with  you the first 24 hours after you arrive home. 8. Bring a current list of your medications, and the last time and date medication taken. 9. Bring ID and current insurance cards. 10.Please wear clothes that are easy to get on and off and wear slip-on shoes.  Thank you for allowing [place] to care for you!   -- [place] [facility]    Follow-Up: At [facility], you and your health needs are our priority.   As part of our continuing mission to provide you with exceptional heart care, we have created designated Provider Care Teams.  These Care Teams include your primary Cardiologist (physician) and Advanced Practice Providers (APPs -  Physician Assistants and Nurse Practitioners) who all work together to provide you with the care you need, when you need it.  We recommend signing up for the patient portal called "MyChart".  Sign up information is provided on this After Visit Summary.  MyChart is used to connect with patients for Virtual Visits (Telemedicine).  Patients are able to view lab/test results, encounter notes, upcoming appointments, etc.  Non-urgent messages can be sent to your provider as well.   To learn more about what you can do with MyChart, go to [email or web address].    Your next appointment will be dependent on the results of your testing and it will be with:    Provider:   Dr. Dietrich Pates, MD

## 2023-01-12 LAB — COMPREHENSIVE METABOLIC PANEL
ALT: 9 IU/L (ref 0–32)
AST: 17 IU/L (ref 0–40)
Albumin: 3.7 g/dL — ABNORMAL LOW (ref 3.9–4.9)
Alkaline Phosphatase: 84 IU/L (ref 44–121)
BUN/Creatinine Ratio: 9 (ref 9–23)
BUN: 14 mg/dL (ref 6–24)
Bilirubin Total: 0.4 mg/dL (ref 0.0–1.2)
CO2: 21 mmol/L (ref 20–29)
Calcium: 8.7 mg/dL (ref 8.7–10.2)
Chloride: 102 mmol/L (ref 96–106)
Creatinine, Ser: 1.52 mg/dL — ABNORMAL HIGH (ref 0.57–1.00)
Globulin, Total: 3 g/dL (ref 1.5–4.5)
Glucose: 92 mg/dL (ref 70–99)
Potassium: 4.3 mmol/L (ref 3.5–5.2)
Sodium: 138 mmol/L (ref 134–144)
Total Protein: 6.7 g/dL (ref 6.0–8.5)
eGFR: 42 mL/min/{1.73_m2} — ABNORMAL LOW (ref >59)

## 2023-01-12 LAB — NMR, LIPOPROFILE
Cholesterol, Total: 163 mg/dL (ref 100–199)
HDL Particle Number: 18.2 umol/L — ABNORMAL LOW (ref >=30.5)
HDL-C: 35 mg/dL — ABNORMAL LOW (ref >39)
LDL Particle Number: 1317 nmol/L — ABNORMAL HIGH (ref <1000)
LDL Size: 21.4 nm (ref >20.5)
LDL-C (NIH Calc): 115 mg/dL — ABNORMAL HIGH (ref 0–99)
Small LDL Particle Number: 475 nmol/L (ref <=527)
Triglycerides: 65 mg/dL (ref 0–149)

## 2023-01-12 LAB — PRO B NATRIURETIC PEPTIDE: NT-Pro BNP: 3637 pg/mL — ABNORMAL HIGH (ref 0–249)

## 2023-01-12 LAB — TSH: TSH: 1 u[IU]/mL (ref 0.450–4.500)

## 2023-01-12 LAB — HEMOGLOBIN A1C
Est. average glucose Bld gHb Est-mCnc: 126 mg/dL
Hgb A1c MFr Bld: 6 % — ABNORMAL HIGH (ref 4.8–5.6)

## 2023-01-13 LAB — APOLIPOPROTEIN B: Apolipoprotein B: 88 mg/dL (ref <90)

## 2023-01-13 LAB — LIPOPROTEIN A (LPA): Lipoprotein (a): 76.1 nmol/L — ABNORMAL HIGH (ref <75.0)

## 2023-01-14 ENCOUNTER — Telehealth: Payer: Self-pay | Admitting: Internal Medicine

## 2023-01-14 ENCOUNTER — Telehealth: Payer: Self-pay | Admitting: *Deleted

## 2023-01-14 NOTE — Telephone Encounter (Signed)
Patient states she was returning call. Please advise  

## 2023-01-14 NOTE — Telephone Encounter (Addendum)
Cardiac Catheterization scheduled at [facility] for: Tuesday January 18, 2023 10:30 AM Arrival time [facility] Main Entrance A at: 5:30 AM-pre-procedure hydration-per protocol GFR <45 (42)  Nothing to eat after midnight prior to procedure, clear liquids until 5 AM day of procedure.  Medication instructions: -Hold:  Losartan/hydrochlorothiazide-day before and day of procedure -per protocol GFR <60 (42) -Other usual morning medications can be taken with sips of water including aspirin 81 mg and Plavix 75 mg.  Plan to go home the same day, you will only stay overnight if medically necessary.  You must have responsible adult to drive you home.  Someone must be with you the first 24 hours after you arrive home.  Reviewed procedure instructions and discussed pre-procedure hydration with patient.

## 2023-01-14 NOTE — Telephone Encounter (Signed)
Attempted to call patient back, no answer and unable to leave message. Voicemail full.

## 2023-01-17 NOTE — Telephone Encounter (Signed)
Pt spoke to the procedure nurse re: her upcoming Heart Cath. Will close this encounter.

## 2023-01-18 ENCOUNTER — Other Ambulatory Visit: Payer: Self-pay

## 2023-01-18 ENCOUNTER — Ambulatory Visit (HOSPITAL_COMMUNITY)
Admission: RE | Admit: 2023-01-18 | Discharge: 2023-01-18 | Disposition: A | Discharge status: Home / Self Care | Attending: Cardiovascular Disease | Admitting: Cardiovascular Disease

## 2023-01-18 ENCOUNTER — Encounter (HOSPITAL_COMMUNITY)
Admission: RE | Disposition: A | Discharge status: Home / Self Care | Payer: Self-pay | Source: Home / Self Care | Attending: Cardiovascular Disease

## 2023-01-18 DIAGNOSIS — E785 Hyperlipidemia, unspecified: Secondary | ICD-10-CM | POA: Diagnosis not present

## 2023-01-18 DIAGNOSIS — I10 Essential (primary) hypertension: Secondary | ICD-10-CM | POA: Insufficient documentation

## 2023-01-18 DIAGNOSIS — Z7902 Long term (current) use of antithrombotics/antiplatelets: Secondary | ICD-10-CM | POA: Insufficient documentation

## 2023-01-18 DIAGNOSIS — Z7982 Long term (current) use of aspirin: Secondary | ICD-10-CM | POA: Diagnosis not present

## 2023-01-18 DIAGNOSIS — I350 Nonrheumatic aortic (valve) stenosis: Secondary | ICD-10-CM | POA: Diagnosis not present

## 2023-01-18 DIAGNOSIS — G4733 Obstructive sleep apnea (adult) (pediatric): Secondary | ICD-10-CM | POA: Diagnosis not present

## 2023-01-18 DIAGNOSIS — I25119 Atherosclerotic heart disease of native coronary artery with unspecified angina pectoris: Secondary | ICD-10-CM | POA: Diagnosis not present

## 2023-01-18 DIAGNOSIS — I252 Old myocardial infarction: Secondary | ICD-10-CM | POA: Insufficient documentation

## 2023-01-18 DIAGNOSIS — Z79899 Other long term (current) drug therapy: Secondary | ICD-10-CM | POA: Diagnosis not present

## 2023-01-18 DIAGNOSIS — F17210 Nicotine dependence, cigarettes, uncomplicated: Secondary | ICD-10-CM | POA: Insufficient documentation

## 2023-01-18 DIAGNOSIS — E119 Type 2 diabetes mellitus without complications: Secondary | ICD-10-CM | POA: Insufficient documentation

## 2023-01-18 DIAGNOSIS — I200 Unstable angina: Secondary | ICD-10-CM

## 2023-01-18 DIAGNOSIS — R0602 Shortness of breath: Secondary | ICD-10-CM | POA: Diagnosis present

## 2023-01-18 DIAGNOSIS — Z01812 Encounter for preprocedural laboratory examination: Secondary | ICD-10-CM

## 2023-01-18 HISTORY — PX: RIGHT/LEFT HEART CATH AND CORONARY ANGIOGRAPHY: CATH118266

## 2023-01-18 LAB — CBC
HCT: 41.1 % (ref 36.0–46.0)
Hemoglobin: 13.4 g/dL (ref 12.0–15.0)
MCH: 27.1 pg (ref 26.0–34.0)
MCHC: 32.6 g/dL (ref 30.0–36.0)
MCV: 83 fL (ref 80.0–100.0)
Platelets: 184 10*3/uL (ref 150–400)
RBC: 4.95 MIL/uL (ref 3.87–5.11)
RDW: 14.8 % (ref 11.5–15.5)
WBC: 5.1 10*3/uL (ref 4.0–10.5)
nRBC: 0 % (ref 0.0–0.2)

## 2023-01-18 LAB — POCT I-STAT 7, (LYTES, BLD GAS, ICA,H+H)
Acid-base deficit: 2 mmol/L (ref 0.0–2.0)
Bicarbonate: 22.5 mmol/L (ref 20.0–28.0)
Calcium, Ion: 1.12 mmol/L — ABNORMAL LOW (ref 1.15–1.40)
HCT: 39 % (ref 36.0–46.0)
Hemoglobin: 13.3 g/dL (ref 12.0–15.0)
O2 Saturation: 90 %
Potassium: 3.4 mmol/L — ABNORMAL LOW (ref 3.5–5.1)
Sodium: 140 mmol/L (ref 135–145)
TCO2: 24 mmol/L (ref 22–32)
pCO2 arterial: 35.4 mm[Hg] (ref 32–48)
pH, Arterial: 7.411 (ref 7.35–7.45)
pO2, Arterial: 59 mm[Hg] — ABNORMAL LOW (ref 83–108)

## 2023-01-18 LAB — PREGNANCY, URINE: Preg Test, Ur: NEGATIVE

## 2023-01-18 SURGERY — RIGHT/LEFT HEART CATH AND CORONARY ANGIOGRAPHY
Anesthesia: LOCAL

## 2023-01-18 MED ORDER — AMLODIPINE BESYLATE 5 MG PO TABS
10.0000 mg | ORAL_TABLET | Freq: Once | ORAL | Status: AC
  Administered 2023-01-18: 10 mg via ORAL
  Filled 2023-01-18: qty 2

## 2023-01-18 MED ORDER — ACETAMINOPHEN 325 MG PO TABS: 650.0000 mg | ORAL_TABLET | ORAL | Status: DC | PRN

## 2023-01-18 MED ORDER — SODIUM CHLORIDE 0.9 % WEIGHT BASED INFUSION
3.0000 mL/kg/h | INTRAVENOUS | Status: AC
  Administered 2023-01-18: 3 mL/kg/h via INTRAVENOUS

## 2023-01-18 MED ORDER — ONDANSETRON HCL 4 MG/2ML IJ SOLN: 4.0000 mg | Freq: Four times a day (QID) | INTRAMUSCULAR | Status: DC | PRN

## 2023-01-18 MED ORDER — SODIUM CHLORIDE 0.9 % WEIGHT BASED INFUSION
1.0000 mL/kg/h | INTRAVENOUS | Status: DC
Start: 2023-01-18 — End: 2023-01-18

## 2023-01-18 MED ORDER — SODIUM CHLORIDE 0.9% FLUSH: 3.0000 mL | Freq: Two times a day (BID) | INTRAVENOUS | Status: DC

## 2023-01-18 MED ORDER — VERAPAMIL HCL 2.5 MG/ML IV SOLN
INTRAVENOUS | Status: AC
  Filled 2023-01-18: qty 2

## 2023-01-18 MED ORDER — HEPARIN SODIUM (PORCINE) 1000 UNIT/ML IJ SOLN
INTRAMUSCULAR | Status: DC | PRN
  Administered 2023-01-18: 5000 [IU] via INTRAVENOUS

## 2023-01-18 MED ORDER — HYDRALAZINE HCL 20 MG/ML IJ SOLN
INTRAMUSCULAR | Status: DC | PRN
  Administered 2023-01-18: 10 mg via INTRAVENOUS

## 2023-01-18 MED ORDER — IOHEXOL 350 MG/ML SOLN
INTRAVENOUS | Status: DC | PRN
  Administered 2023-01-18: 40 mL

## 2023-01-18 MED ORDER — FENTANYL CITRATE (PF) 100 MCG/2ML IJ SOLN
INTRAMUSCULAR | Status: DC | PRN
  Administered 2023-01-18: 25 ug via INTRAVENOUS

## 2023-01-18 MED ORDER — HYDRALAZINE HCL 20 MG/ML IJ SOLN
10.0000 mg | INTRAMUSCULAR | Status: DC | PRN
  Administered 2023-01-18: 10 mg via INTRAVENOUS
  Filled 2023-01-18: qty 1

## 2023-01-18 MED ORDER — LABETALOL HCL 5 MG/ML IV SOLN: 10.0000 mg | INTRAVENOUS | Status: DC | PRN

## 2023-01-18 MED ORDER — ASPIRIN 81 MG PO CHEW: 81.0000 mg | CHEWABLE_TABLET | ORAL | Status: DC

## 2023-01-18 MED ORDER — LABETALOL HCL 5 MG/ML IV SOLN
INTRAVENOUS | Status: DC | PRN
  Administered 2023-01-18 (×2): 10 mg via INTRAVENOUS

## 2023-01-18 MED ORDER — ISOSORBIDE MONONITRATE ER 60 MG PO TB24
120.0000 mg | ORAL_TABLET | Freq: Once | ORAL | Status: AC
  Administered 2023-01-18: 120 mg via ORAL
  Filled 2023-01-18 (×2): qty 2

## 2023-01-18 MED ORDER — MIDAZOLAM HCL 2 MG/2ML IJ SOLN
INTRAMUSCULAR | Status: AC
  Filled 2023-01-18: qty 2

## 2023-01-18 MED ORDER — VERAPAMIL HCL 2.5 MG/ML IV SOLN
INTRAVENOUS | Status: DC | PRN
  Administered 2023-01-18: 10 mL via INTRA_ARTERIAL

## 2023-01-18 MED ORDER — LIDOCAINE HCL (PF) 1 % IJ SOLN
INTRAMUSCULAR | Status: DC | PRN
  Administered 2023-01-18 (×2): 2 mL

## 2023-01-18 MED ORDER — LIDOCAINE HCL (PF) 1 % IJ SOLN
INTRAMUSCULAR | Status: AC
  Filled 2023-01-18: qty 30

## 2023-01-18 MED ORDER — MIDAZOLAM HCL 2 MG/2ML IJ SOLN
INTRAMUSCULAR | Status: DC | PRN
  Administered 2023-01-18: 2 mg via INTRAVENOUS

## 2023-01-18 MED ORDER — SODIUM CHLORIDE 0.9% FLUSH: 3.0000 mL | INTRAVENOUS | Status: DC | PRN

## 2023-01-18 MED ORDER — HEPARIN SODIUM (PORCINE) 1000 UNIT/ML IJ SOLN
INTRAMUSCULAR | Status: AC
  Filled 2023-01-18: qty 10

## 2023-01-18 MED ORDER — CLOPIDOGREL BISULFATE 75 MG PO TABS: 75.0000 mg | ORAL_TABLET | ORAL | Status: DC

## 2023-01-18 MED ORDER — LABETALOL HCL 5 MG/ML IV SOLN
INTRAVENOUS | Status: AC
  Filled 2023-01-18: qty 4

## 2023-01-18 MED ORDER — METOPROLOL SUCCINATE ER 25 MG PO TB24
50.0000 mg | ORAL_TABLET | Freq: Once | ORAL | Status: AC
Start: 2023-01-18 — End: 2023-01-18
  Administered 2023-01-18: 50 mg via ORAL
  Filled 2023-01-18: qty 2

## 2023-01-18 MED ORDER — FENTANYL CITRATE (PF) 100 MCG/2ML IJ SOLN
INTRAMUSCULAR | Status: AC
  Filled 2023-01-18: qty 2

## 2023-01-18 MED ORDER — SODIUM CHLORIDE 0.9 % IV SOLN: 250.0000 mL | INTRAVENOUS | Status: DC | PRN

## 2023-01-18 SURGICAL SUPPLY — 10 items
CATH 5FR JL3.5 JR4 ANG PIG MP (CATHETERS) IMPLANT
CATH BALLN WEDGE 5F 110CM (CATHETERS) IMPLANT
DEVICE RAD COMP TR BAND LRG (VASCULAR PRODUCTS) IMPLANT
GLIDESHEATH SLEND SS 6F .021 (SHEATH) IMPLANT
GUIDEWIRE INQWIRE 1.5J.035X260 (WIRE) IMPLANT
INQWIRE 1.5J .035X260CM (WIRE) ×1
PACK CARDIAC CATHETERIZATION (CUSTOM PROCEDURE TRAY) ×1 IMPLANT
SET ATX-X65L (MISCELLANEOUS) IMPLANT
SHEATH GLIDE SLENDER 4/5FR (SHEATH) IMPLANT
WIRE MICROINTRODUCER 60CM (WIRE) IMPLANT

## 2023-01-18 NOTE — Interval H&P Note (Signed)
History and Physical Interval Note:  01/18/2023 9:21 AM  Jodi Porter  has presented today for surgery, with the diagnosis of dynesa.  The various methods of treatment have been discussed with the patient and family. After consideration of risks, benefits and other options for treatment, the patient has consented to  Procedure(s): RIGHT/LEFT HEART CATH AND CORONARY ANGIOGRAPHY (N/A) as a surgical intervention.  The patient's history has been reviewed, patient examined, no change in status, stable for surgery.  I have reviewed the patient's chart and labs.  Questions were answered to the patient's satisfaction.     Tonny Bollman

## 2023-01-18 NOTE — Interval H&P Note (Signed)
History and Physical Interval Note:  01/18/2023 10:17 AM  Jodi Porter  has presented today for surgery, with the diagnosis of dynesa.  The various methods of treatment have been discussed with the patient and family. After consideration of risks, benefits and other options for treatment, the patient has consented to  Procedure(s): RIGHT/LEFT HEART CATH AND CORONARY ANGIOGRAPHY (N/A) as a surgical intervention.  The patient's history has been reviewed, patient examined, no change in status, stable for surgery.  I have reviewed the patient's chart and labs.  Questions were answered to the patient's satisfaction.     Tonny Bollman

## 2023-01-19 ENCOUNTER — Encounter (HOSPITAL_COMMUNITY): Payer: Self-pay | Admitting: Cardiovascular Disease

## 2023-01-19 LAB — POCT I-STAT EG7
Acid-base deficit: 1 mmol/L (ref 0.0–2.0)
Bicarbonate: 24.6 mmol/L (ref 20.0–28.0)
Calcium, Ion: 1.12 mmol/L — ABNORMAL LOW (ref 1.15–1.40)
HCT: 39 % (ref 36.0–46.0)
Hemoglobin: 13.3 g/dL (ref 12.0–15.0)
O2 Saturation: 64 %
Potassium: 3.4 mmol/L — ABNORMAL LOW (ref 3.5–5.1)
Sodium: 140 mmol/L (ref 135–145)
TCO2: 26 mmol/L (ref 22–32)
pCO2, Ven: 41.5 mm[Hg] — ABNORMAL LOW (ref 44–60)
pH, Ven: 7.38 (ref 7.25–7.43)
pO2, Ven: 34 mm[Hg] (ref 32–45)

## 2023-01-29 NOTE — Progress Notes (Signed)
BP was severely elevated at time of heart catheterization Keep on current medicines    Would add 25 mg hydralazine tid to get better control She should then follow up with PharmD to further titrate meds, control bp

## 2023-02-01 ENCOUNTER — Telehealth: Payer: Self-pay

## 2023-02-01 DIAGNOSIS — Z79899 Other long term (current) drug therapy: Secondary | ICD-10-CM

## 2023-02-01 DIAGNOSIS — I10 Essential (primary) hypertension: Secondary | ICD-10-CM

## 2023-02-01 MED ORDER — HYDRALAZINE HCL 25 MG PO TABS: 25.0000 mg | ORAL_TABLET | Freq: Three times a day (TID) | ORAL | 3 refills | Status: DC

## 2023-02-01 NOTE — Telephone Encounter (Signed)
 Author: Okey Vina GAILS, MD Service: Cardiology Author Type: Physician  Filed: 01/29/2023  7:49 AM Date of Service: 01/18/2023  1:34 PM Status: Signed  Editor: Okey Vina GAILS, MD (Physician)   BP was severely elevated at time of heart catheterization Keep on current medicines    Would add 25 mg hydralazine  tid to get better control She should then follow up with PharmD to further titrate meds, control bp     Attempted to reach patients at both numbers on file, but no answer and voicemail not set up on either number. Hydralazine  sent to pharmacy on file so patient can start as soon as possible, but will continue to reach her to review. PharmD referral placed for med titration.

## 2023-02-01 NOTE — Telephone Encounter (Signed)
-----   Message from Dietrich Pates sent at 01/29/2023  7:47 AM EST -----    ----- Message ----- From: Tonny Bollman, MD Sent: 01/18/2023  11:09 AM EST To: Pricilla Riffle, MD

## 2023-02-07 NOTE — Telephone Encounter (Signed)
 I spoke with the pt and she verbalized understanding and will start the hydralazine today.

## 2023-02-18 ENCOUNTER — Ambulatory Visit (HOSPITAL_COMMUNITY): Attending: Cardiology

## 2023-02-18 ENCOUNTER — Telehealth: Payer: Self-pay | Admitting: Cardiology

## 2023-02-18 DIAGNOSIS — I350 Nonrheumatic aortic (valve) stenosis: Secondary | ICD-10-CM | POA: Diagnosis present

## 2023-02-18 LAB — ECHOCARDIOGRAM COMPLETE
Area-P 1/2: 2.95 cm2
P 1/2 time: 460 ms
S' Lateral: 3 cm

## 2023-02-18 NOTE — Telephone Encounter (Signed)
PT CAME IN FOR HER ECHO AND ASK TO HAVE A NURSE TO CALL HER SHE IS HAVING ISSUES WITH HEART AND MAKING HER FEEL FUNNY AND SAYS IS HIGHT

## 2023-02-21 ENCOUNTER — Telehealth: Payer: Self-pay

## 2023-02-21 DIAGNOSIS — E7800 Pure hypercholesterolemia, unspecified: Secondary | ICD-10-CM

## 2023-02-21 DIAGNOSIS — I350 Nonrheumatic aortic (valve) stenosis: Secondary | ICD-10-CM

## 2023-02-21 DIAGNOSIS — I25118 Atherosclerotic heart disease of native coronary artery with other forms of angina pectoris: Secondary | ICD-10-CM

## 2023-02-21 DIAGNOSIS — I10 Essential (primary) hypertension: Secondary | ICD-10-CM

## 2023-02-21 DIAGNOSIS — Z79899 Other long term (current) drug therapy: Secondary | ICD-10-CM

## 2023-02-21 NOTE — Telephone Encounter (Signed)
-----   Message from [place] sent at 02/21/2023  4:39 PM EST ----- Spoke to patient    Reviewed echo   She is still having episodes of PND at night Will need TEE to see AV better, LV outflow track Risks, benefits described.  Pt understands and agrees to proceed.

## 2023-02-21 NOTE — Telephone Encounter (Signed)
Pt to have a TEE ASAP per Dr Tenny Craw.Marland KitchenMarland KitchenMarland KitchenThursday 02/24/23 with Dr Duke Salvia at 12 noon.   Pt to have labs 02/22/23.

## 2023-02-22 NOTE — Telephone Encounter (Signed)
Attempted to call patient at her #, no answer and got message saying voice mail was not set up. Also attempted call spouse robert (DPR), phone just rings and no VM picks up. Sent MyChart message.

## 2023-02-23 ENCOUNTER — Encounter: Payer: Self-pay | Admitting: Internal Medicine

## 2023-02-23 LAB — BASIC METABOLIC PANEL
BUN/Creatinine Ratio: 8 — ABNORMAL LOW (ref 9–23)
BUN: 11 mg/dL (ref 6–24)
CO2: 24 mmol/L (ref 20–29)
Calcium: 8.7 mg/dL (ref 8.7–10.2)
Chloride: 103 mmol/L (ref 96–106)
Creatinine, Ser: 1.38 mg/dL — ABNORMAL HIGH (ref 0.57–1.00)
Glucose: 113 mg/dL — ABNORMAL HIGH (ref 70–99)
Potassium: 4 mmol/L (ref 3.5–5.2)
Sodium: 139 mmol/L (ref 134–144)
eGFR: 48 mL/min/{1.73_m2} — ABNORMAL LOW (ref >59)

## 2023-02-23 LAB — CBC
Hematocrit: 40.7 % (ref 34.0–46.6)
Hemoglobin: 13 g/dL (ref 11.1–15.9)
MCH: 26.5 pg — ABNORMAL LOW (ref 26.6–33.0)
MCHC: 31.9 g/dL (ref 31.5–35.7)
MCV: 83 fL (ref 79–97)
Platelets: 201 10*3/uL (ref 150–450)
RBC: 4.91 x10E6/uL (ref 3.77–5.28)
RDW: 13.4 % (ref 11.7–15.4)
WBC: 5.1 10*3/uL (ref 3.4–10.8)

## 2023-02-23 NOTE — H&P (Signed)
Cardiology Admission History and Physical   Patient ID: Jodi Porter MRN: [id number]; DOB: [birth year]   PCP:  Eartha Inch, MD  History of Present Illness:   Jodi Porter is a 48 y.o. female with a hx of CAD, HTN tobacco use, gestational DM, T2DM, and OSA who was previously followed by Dr. Katrinka Blazing and Jon Billings  who now returns to clinic for follow-up.   Sept 2022 Inferior STEMI  Heart cath 10/26/20 with nonobstructive CAD (50 to 60% RCA; suspect ruptured plaque with thrombus resolution vs spasm)  Recomm medical Rx which included DAPT with ASA and plavix, amlodipine, and BB.  She re-presented in 11/2020 with recurrent chest pain. Cardiac cath with diffuse luminal irregularities in the RCA of 40 to 50% in the mid to distal vessel, 50% ostial PDA with RFR noted at 0.95. Continued medical management recommended.   The pt previously seen in clinic by Jon Billings, last in Jan 2024  BP was high at time   Patient reluctant to change   Said very sensitive to meds   BP 90s to 200s/    I saw the pt in clinic on 01/10/23   She was not feeling good   Complained of giving out with daily activity   WOrked as Lawyer  Couldn't keep [employer] of work Pt also noted angina every night  Would wake up ripping CPAP off   Very SOB    Without CPPA also had problems breathing       BP very labile at home       With this hx I recomm R/L heart cath   This was done on 01/18/23   This showed mild nonobstructive plaquing of LAD and LCx; mild to mod plaquing in RCA   RHC measurements normal     Severe systemic HTN noted    Recomm maximizing Rx as outpt  The pt also had echo done 02/18/23    On review the AV was difficult to see    Mean gradient throgh LVOT/AV was 37 mm Hg   ? If obstruction at valve or subvalvular    There also appeared to be mod to severe AI  I have reviewed findings with pt  on the phone  Findings  different from previous echo in 2022 Would recomm TEE to evaluate further   She is still very  symptomatic Risks/benefits described  Pt understands and agrees to proceed.     Past Medical History:  Diagnosis Date   Gestational diabetes    Gestational diabetes    Hypertension     Past Surgical History:  Procedure Laterality Date   CORONARY PRESSURE/FFR STUDY N/A 11/18/2020   Procedure: INTRAVASCULAR PRESSURE WIRE/FFR STUDY;  Surgeon: Lyn Records, MD;  Location: [facility];  Service: Cardiovascular;  Laterality: N/A;   CORONARY/GRAFT ACUTE MI REVASCULARIZATION N/A 10/26/2020   Procedure: Coronary/Graft Acute MI Revascularization;  Surgeon: Lyn Records, MD;  Location: [facility];  Service: Cardiovascular;  Laterality: N/A;   ECTOPIC PREGNANCY SURGERY     LEFT HEART CATH AND CORONARY ANGIOGRAPHY N/A 10/26/2020   Procedure: LEFT HEART CATH AND CORONARY ANGIOGRAPHY;  Surgeon: Lyn Records, MD;  Location: [facility];  Service: Cardiovascular;  Laterality: N/A;   LEFT HEART CATH AND CORONARY ANGIOGRAPHY N/A 11/18/2020   Procedure: LEFT HEART CATH AND CORONARY ANGIOGRAPHY;  Surgeon: Lyn Records, MD;  Location: [facility];  Service: Cardiovascular;  Laterality: N/A;  RIGHT/LEFT HEART CATH AND CORONARY ANGIOGRAPHY N/A 01/18/2023   Procedure: RIGHT/LEFT HEART CATH AND CORONARY ANGIOGRAPHY;  Surgeon: Tonny Bollman, MD;  Location: [facility] INVASIVE CV LAB;  Service: Cardiovascular;  Laterality: N/A;     Medications Prior to Admission: Prior to Admission medications   Medication Sig Start Date End Date Taking? Authorizing Provider  amLODipine (NORVASC) 10 MG tablet Take 1 tablet (10 mg total) by mouth daily. 11/23/22  Yes Dyann Kief, PA-C  aspirin EC 81 MG tablet Take 81 mg by mouth as needed. Swallow whole.   Yes [provider]  atorvastatin (LIPITOR) 80 MG tablet TAKE 1 TABLET(80 MG) BY MOUTH DAILY 04/29/21  Yes Lyn Records, MD  clopidogrel (PLAVIX) 75 MG tablet TAKE 1 TABLET(75 MG) BY MOUTH DAILY WITH BREAKFAST 04/29/21  Yes Lyn Records, MD  hydrALAZINE (APRESOLINE) 25 MG tablet Take 1 tablet (25 mg total) by mouth 3 (three) times daily. 02/01/23  Yes Pricilla Riffle, MD  hydrochlorothiazide (MICROZIDE) 12.5 MG capsule TAKE 1 CAPSULE(12.5 MG) BY MOUTH DAILY 04/29/21  Yes Lyn Records, MD  isosorbide mononitrate (IMDUR) 120 MG 24 hr tablet Take 1 tablet (120 mg total) by mouth daily. 11/23/22  Yes Dyann Kief, PA-C  levonorgestrel (MIRENA, 52 MG,) 20 MCG/DAY IUD Mirena 21 mcg/24 hours (8 yrs) 52 mg intrauterine device  to be inserted by provider for one dose   Yes [provider]  losartan (COZAAR) 50 MG tablet Take 1 tablet (50 mg total) by mouth daily. 08/09/22  Yes Meriam Sprague, MD  metoprolol succinate (TOPROL-XL) 50 MG 24 hr tablet TAKE 1 TABLET(50 MG) BY MOUTH DAILY WITH OR IMMEDIATELY FOLLOWING A MEAL 04/29/21  Yes Lyn Records, MD  nitroGLYCERIN (NITROSTAT) 0.4 MG SL tablet Place 1 tablet (0.4 mg total) under the tongue every 5 (five) minutes as needed. 08/09/22  Yes Meriam Sprague, MD     Allergies:    Allergies  Allergen Reactions   Penicillins Rash   Tramadol     Shake and get nauseated    Social History:   Social History   Socioeconomic History   Marital status: Single    Spouse name: Not on file   Number of children: Not on file   Years of education: Not on file   Highest education level: Not on file  Occupational History   Not on file  Tobacco Use   Smoking status: Every Day    Current packs/day: 0.50    Types: Cigarettes   Smokeless tobacco: Never  Substance and Sexual Activity   Alcohol use: No   Drug use: No   Sexual activity: Yes    Birth control/protection: I.U.D.  Other Topics Concern   Not on file  Social History Narrative   Not on file   Social Drivers of Health   Financial Resource Strain: Not on file  Food Insecurity: No Food Insecurity (07/29/2020)   Received from [facility]   Hunger Vital Sign    Worried About Running Out of  Food in the Last Year: Never true    Ran Out of Food in the Last Year: Never true  Transportation Needs: Not on file  Physical Activity: Not on file  Stress: Not on file  Social Connections: Unknown (06/12/2021)   Received from [facility]   Social Network    Social Network: Not on file  Intimate Partner Violence: Not At Risk (04/30/2022)   [facility]   HITS    Over the last 12 months how often did your partner physically hurt you?: Never    Over the last 12 months how often did your partner insult you or talk down to you?: Never    Over the last 12 months how often did your partner threaten you with physical harm?: Never    Over the last 12 months how often did your partner scream or curse at you?: Never        Physical Exam/Data:  There were no vitals filed for this visit. No intake or output data in the 24 hours ending 02/23/23 2325    01/18/2023    6:52 AM 01/10/2023    2:22 PM 03/02/2022    4:25 PM  Last 3 Weights  Weight (lbs) 208 lb 208 lb 6.4 oz 222 lb 12.8 oz  Weight (kg) 94.348 kg 94.53 kg 101.061 kg     Exam on 01/10/23  GEN:  Well nourished, well developed in no acute distress HEENT: Normal NECK: No JVD; No carotid bruits CARDIAC: RRR, 2/6 systolic murmurLSB to base  RESPIRATORY:  Clear to auscultation without rales, wheezes ABDOMEN: Soft, non-tender, non-distended  No hepatomegaly  MUSCULOSKELETAL:  No LE edema;    Relevant CV Studies:  Echo  02/18/23   1. Left ventricular ejection fraction, by estimation, is 60 to 65%. The  left ventricle has normal function. The left ventricle has no regional  wall motion abnormalities. There is moderate left ventricular hypertrophy.  Left ventricular diastolic  parameters are consistent with Grade II diastolic dysfunction  (pseudonormalization). Elevated left atrial pressure.   2. Right ventricular systolic function is normal. The right ventricular  size is normal. There  is normal pulmonary artery systolic pressure. The  estimated right ventricular systolic pressure is 28.0 mmHg.   3. The mitral valve is normal in structure. Trivial mitral valve  regurgitation.   4. The inferior vena cava is normal in size with greater than 50%  respiratory variability, suggesting right atrial pressure of 3 mmHg.   5. The aortic valve was not well visualized. Aortic valve regurgitation  is moderate to severe. There are severely elevated gradients through  aortic valve (Vmax 4.2 m/s, MG 37 mmHg). Aortic valve is not well  visualized but appears to open on parasternal  long axis images. Suspect subvalvular stenosis, possible subaortic  membrane. Recommend TEE for further evaluation    R/L heart cath  01/18/23  eft Main  The vessel exhibits minimal luminal irregularities. The left main is patent with no stenosis. Divides into the LAD and left circumflex.    Left Anterior Descending  There is mild diffuse disease throughout the vessel. The LAD is patent to the apex. There is mild nonobstructive plaquing in the proximal vessel at the first diagonal. There is no significant stenosis.  Mid LAD lesion is 30% stenosed.    Left Circumflex  The vessel exhibits minimal luminal irregularities.  Ost Cx lesion is 30% stenosed.  Mid Cx to Dist Cx lesion is 30% stenosed.    First Obtuse Marginal Branch  The circumflex is patent with mild 30% stenoses in the proximal and mid vessel, unchanged from the previous cath study    Right Coronary Artery  Vessel is large. There is mild diffuse disease throughout the vessel. The RCA is dominant. There is mild nonobstructive plaquing in the mid and distal vessel. The PDA and PLA branches are patent. There is no high-grade obstructive disease. There is no change  in comparison to the previous cardiac catheterization studies. My estimate there is less than 50% stenosis at both lesions sites in the mid and distal vessel.  Prox RCA to Mid RCA lesion  is 40% stenosed.  Dist RCA lesion is 50% stenosed.    Right Posterior Descending Artery  RPDA lesion is 30% stenosed.    Second Right Posterolateral Branch  There is mild disease in the vessel.    Intervention   No interventions have been documented.   RHX   RA  11/9;   RV 34/9;   PA 32/14 PCWP 17   LVEDP 16  CO 5.64  CI 2.81   Coronary Diagrams   Laboratory Data:  High Sensitivity Troponin:  No results for input(s): "TROPONINIHS" in the last 720 hours.    Chemistry Recent Labs  Lab 02/22/23 0811  NA 139  K 4.0  CL 103  CO2 24  GLUCOSE 113*  BUN 11  CREATININE 1.38*  CALCIUM 8.7    No results for input(s): "PROT", "ALBUMIN", "AST", "ALT", "ALKPHOS", "BILITOT" in the last 168 hours. Lipids No results for input(s): "CHOL", "TRIG", "HDL", "LABVLDL", "LDLCALC", "CHOLHDL" in the last 168 hours. Hematology Recent Labs  Lab 02/22/23 0811  WBC 5.1  RBC 4.91  HGB 13.0  HCT 40.7  MCV 83  MCH 26.5*  MCHC 31.9  RDW 13.4  PLT 201   Thyroid No results for input(s): "TSH", "FREET4" in the last 168 hours. BNPNo results for input(s): "BNP", "PROBNP" in the last 168 hours.  DDimer No results for input(s): "DDIMER" in the last 168 hours.   Radiology/Studies:  No results found.   Assessment and Plan:   1 Chest tightness/SOB  Pt with significant symptoms   R/L heart cath recently showed normal pressues, mild to mod CAD    Echo on 1/17 showed significant gradient through LVOT/AV    Difficult to image   There also appeared to be mod to severe AI Would recomm TEE to further evalaute   Risks/ benefits described; pt agrees to proceed  Pan for 02/24/23.       For questions or updates, please contact [facility] Please consult [email or web address] for contact info under     Signed, Dietrich Pates, MD  02/23/2023 11:25 PM

## 2023-02-23 NOTE — Progress Notes (Signed)
Instructed patient to be NPO after MN, driver and an adult to stay for 24 hours.  Pt stated she already made arrangements to come at 1100am and could not change any earlier because of her children.  Advise to keep previous time and to arrive at the [facility]

## 2023-02-24 ENCOUNTER — Encounter (HOSPITAL_COMMUNITY): Payer: Self-pay | Admitting: Cardiovascular Disease

## 2023-02-24 ENCOUNTER — Ambulatory Visit (HOSPITAL_BASED_OUTPATIENT_CLINIC_OR_DEPARTMENT_OTHER)

## 2023-02-24 ENCOUNTER — Ambulatory Visit (HOSPITAL_COMMUNITY)

## 2023-02-24 ENCOUNTER — Other Ambulatory Visit: Payer: Self-pay

## 2023-02-24 ENCOUNTER — Encounter (HOSPITAL_COMMUNITY)
Admission: RE | Disposition: A | Discharge status: Home / Self Care | Payer: Self-pay | Source: Home / Self Care | Attending: Cardiovascular Disease

## 2023-02-24 ENCOUNTER — Ambulatory Visit (HOSPITAL_COMMUNITY)
Admission: RE | Admit: 2023-02-24 | Discharge: 2023-02-24 | Disposition: A | Discharge status: Home / Self Care | Attending: Cardiovascular Disease | Admitting: Cardiovascular Disease

## 2023-02-24 ENCOUNTER — Ambulatory Visit (HOSPITAL_BASED_OUTPATIENT_CLINIC_OR_DEPARTMENT_OTHER)
Admission: RE | Admit: 2023-02-24 | Discharge: 2023-02-24 | Disposition: A | Discharge status: Home / Self Care | Source: Ambulatory Visit | Attending: Cardiovascular Disease | Admitting: Cardiovascular Disease

## 2023-02-24 DIAGNOSIS — Z79899 Other long term (current) drug therapy: Secondary | ICD-10-CM | POA: Insufficient documentation

## 2023-02-24 DIAGNOSIS — I2510 Atherosclerotic heart disease of native coronary artery without angina pectoris: Secondary | ICD-10-CM | POA: Insufficient documentation

## 2023-02-24 DIAGNOSIS — I10 Essential (primary) hypertension: Secondary | ICD-10-CM | POA: Insufficient documentation

## 2023-02-24 DIAGNOSIS — R0602 Shortness of breath: Secondary | ICD-10-CM | POA: Insufficient documentation

## 2023-02-24 DIAGNOSIS — I351 Nonrheumatic aortic (valve) insufficiency: Secondary | ICD-10-CM | POA: Diagnosis not present

## 2023-02-24 DIAGNOSIS — E119 Type 2 diabetes mellitus without complications: Secondary | ICD-10-CM | POA: Insufficient documentation

## 2023-02-24 DIAGNOSIS — F17210 Nicotine dependence, cigarettes, uncomplicated: Secondary | ICD-10-CM | POA: Insufficient documentation

## 2023-02-24 DIAGNOSIS — G4733 Obstructive sleep apnea (adult) (pediatric): Secondary | ICD-10-CM | POA: Diagnosis not present

## 2023-02-24 DIAGNOSIS — I082 Rheumatic disorders of both aortic and tricuspid valves: Secondary | ICD-10-CM | POA: Diagnosis not present

## 2023-02-24 HISTORY — PX: TRANSESOPHAGEAL ECHOCARDIOGRAM (CATH LAB): EP1270

## 2023-02-24 LAB — ECHO TEE
AV Mean grad: 28 mm[Hg]
AV Peak grad: 50.1 mm[Hg]
Ao pk vel: 3.54 m/s
P 1/2 time: 363 ms

## 2023-02-24 SURGERY — TRANSESOPHAGEAL ECHOCARDIOGRAM (TEE) (CATHLAB)
Anesthesia: Monitor Anesthesia Care

## 2023-02-24 MED ORDER — LIDOCAINE 2% (20 MG/ML) 5 ML SYRINGE
INTRAMUSCULAR | Status: DC | PRN
  Administered 2023-02-24: 50 mg via INTRAVENOUS

## 2023-02-24 MED ORDER — SODIUM CHLORIDE 0.9 % IV SOLN
INTRAVENOUS | Status: DC
  Administered 2023-02-24: 20 mL/h via INTRAVENOUS

## 2023-02-24 MED ORDER — PROPOFOL 10 MG/ML IV BOLUS
INTRAVENOUS | Status: DC | PRN
  Administered 2023-02-24: 20 mg via INTRAVENOUS
  Administered 2023-02-24: 30 mg via INTRAVENOUS
  Administered 2023-02-24: 50 mg via INTRAVENOUS

## 2023-02-24 MED ORDER — CHLORTHALIDONE 25 MG PO TABS: 25.0000 mg | ORAL_TABLET | Freq: Every day | ORAL | 3 refills | Status: DC

## 2023-02-24 MED ORDER — OLMESARTAN MEDOXOMIL 40 MG PO TABS: 40.0000 mg | ORAL_TABLET | Freq: Every day | ORAL | 11 refills | Status: DC

## 2023-02-24 MED ORDER — PROPOFOL 500 MG/50ML IV EMUL
INTRAVENOUS | Status: DC | PRN
  Administered 2023-02-24: 75 ug/kg/min via INTRAVENOUS

## 2023-02-24 MED ORDER — HYDRALAZINE HCL 25 MG PO TABS: 50.0000 mg | ORAL_TABLET | Freq: Three times a day (TID) | ORAL | 3 refills | Status: DC

## 2023-02-24 NOTE — CV Procedure (Signed)
Brief TEE Note  LVEF 65% No LA/LAA thrombus or masses Mitral valve is myxomatous.  Mild TR. Moderate to severe AR. No aortic stenosis. 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  For additional details see full report.  Patient was extremely hypertensive throughout her time in endoscopy.  She reports blood pressure consistently in the 200s at home as well.  We will stop hydrochlorothiazide and losartan.  Start chlorthalidone 25mg  and olmesartan 40mg .  Increase hydralazine to 50mg  tid.  She will likely benefit from the addition of spironolactone.  However, we will test renin and aldosterone levels first given her history of intermittent hypokalemia.  We will get her scheduled in [facility].  Rucha Wissinger C. Duke Salvia, MD, [facility] 02/24/2023 12:28 PM

## 2023-02-24 NOTE — Transfer of Care (Signed)
Immediate Anesthesia Transfer of Care Note  Patient: Jodi Porter  Procedure(s) Performed: TRANSESOPHAGEAL ECHOCARDIOGRAM  Patient Location: PACU and Cath Lab  Anesthesia Type:MAC  Level of Consciousness: awake and patient cooperative  Airway & Oxygen Therapy: Patient Spontanous Breathing and Patient connected to nasal cannula oxygen  Post-op Assessment: Report given to RN and Post -op Vital signs reviewed and stable  Post vital signs: Reviewed and stable  Last Vitals:  Vitals Value Taken Time  BP    Temp    Pulse    Resp    SpO2      Last Pain: There were no vitals filed for this visit.       Complications: No notable events documented.

## 2023-02-24 NOTE — Discharge Instructions (Addendum)
Please increase hydralazine to 50mg  three times daily.  Stop taking hydrochlorthiazide and losartan.  Start chlorthalidone 25mg  daily and olmesartan 40mg  daily. In one week, please go for labs first thing in the morning.  You can go to any [facility] to have these drawn.  We have scheduled an appointment for you to be seen in the [facility].    Electrical Cardioversion Electrical cardioversion is the delivery of a jolt of electricity to restore a normal rhythm to the heart. A rhythm that is too fast or is not regular keeps the heart from pumping well. In this procedure, sticky patches or metal paddles are placed on the chest to deliver electricity to the heart from a device. This procedure may be done in an emergency if: There is low or no blood pressure as a result of the heart rhythm. Normal rhythm must be restored as fast as possible to protect the brain and heart from further damage. It may save a life. This may also be a scheduled procedure for irregular or fast heart rhythms that are not immediately life-threatening.  What can I expect after the procedure? Your blood pressure, heart rate, breathing rate, and blood oxygen level will be monitored until you leave the hospital or clinic. Your heart rhythm will be watched to make sure it does not change. You may have some redness on the skin where the shocks were given. Over the counter cortizone cream may be helpful.  Follow these instructions at home: Do not drive for 24 hours if you were given a sedative during your procedure. Take over-the-counter and prescription medicines only as told by your health care provider. Ask your health care provider how to check your pulse. Check it often. Rest for 48 hours after the procedure or as told by your health care provider. Avoid or limit your caffeine use as told by your health care provider. Keep all follow-up visits as told by your health care provider. This is important. Contact a  health care provider if: You feel like your heart is beating too quickly or your pulse is not regular. You have a serious muscle cramp that does not go away. Get help right away if: You have discomfort in your chest. You are dizzy or you feel faint. You have trouble breathing or you are short of breath. Your speech is slurred. You have trouble moving an arm or leg on one side of your body. Your fingers or toes turn cold or blue. Summary Electrical cardioversion is the delivery of a jolt of electricity to restore a normal rhythm to the heart. This procedure may be done right away in an emergency or may be a scheduled procedure if the condition is not an emergency. Generally, this is a safe procedure. After the procedure, check your pulse often as told by your health care provider. This information is not intended to replace advice given to you by your health care provider. Make sure you discuss any questions you have with your health care provider. Document Revised: 08/21/2018 Document Reviewed: 08/21/2018 Elsevier Patient Education  2020 [employer].

## 2023-02-24 NOTE — Anesthesia Postprocedure Evaluation (Signed)
Anesthesia Post Note  Patient: Jodi Porter  Procedure(s) Performed: TRANSESOPHAGEAL ECHOCARDIOGRAM     Patient location during evaluation: PACU Anesthesia Type: MAC Level of consciousness: awake and alert and oriented Pain management: pain level controlled Vital Signs Assessment: post-procedure vital signs reviewed and stable Respiratory status: spontaneous breathing, nonlabored ventilation and respiratory function stable Cardiovascular status: stable and blood pressure returned to baseline Postop Assessment: no apparent nausea or vomiting Anesthetic complications: no   No notable events documented.  Last Vitals:  Vitals:   02/24/23 1135 02/24/23 1230  BP: (!) 229/89   Pulse: (!) 55   Resp: 15   Temp:  36.7 C  SpO2: 99%     Last Pain:  Vitals:   02/24/23 1230  TempSrc: Temporal  PainSc: 0-No pain                 Christepher Melchior A.

## 2023-02-24 NOTE — Progress Notes (Signed)
Echocardiogram Echocardiogram Transesophageal has been performed.  Lucendia Herrlich 02/24/2023, 12:48 PM

## 2023-02-24 NOTE — Anesthesia Preprocedure Evaluation (Signed)
Anesthesia Evaluation  Patient identified by MRN, date of birth, ID band Patient awake    Reviewed: Allergy & Precautions, NPO status , Patient's Chart, lab work & pertinent test results, reviewed documented beta blocker date and time   Airway Mallampati: III       Dental no notable dental hx. (+) Teeth Intact, Dental Advisory Given   Pulmonary sleep apnea and Continuous Positive Airway Pressure Ventilation , Current Smoker and Patient abstained from smoking.   Pulmonary exam normal breath sounds clear to auscultation       Cardiovascular hypertension, Pt. on medications and Pt. on home beta blockers + angina with exertion + Past MI  Normal cardiovascular exam Rhythm:Regular Rate:Normal     Neuro/Psych negative neurological ROS  negative psych ROS   GI/Hepatic negative GI ROS, Neg liver ROS,,,  Endo/Other  diabetes, Well Controlled, Gestational  Obesity  Renal/GU negative Renal ROS  negative genitourinary   Musculoskeletal negative musculoskeletal ROS (+)    Abdominal  (+) + obese  Peds  Hematology Plavix therapy- last dose this am   Anesthesia Other Findings   Reproductive/Obstetrics                              Anesthesia Physical Anesthesia Plan  ASA: 3  Anesthesia Plan: MAC   Post-op Pain Management: Minimal or no pain anticipated   Induction: Intravenous  PONV Risk Score and Plan: 1 and Treatment may vary due to age or medical condition and Propofol infusion  Airway Management Planned: Natural Airway and Nasal Cannula  Additional Equipment: None  Intra-op Plan:   Post-operative Plan:   Informed Consent: I have reviewed the patients History and Physical, chart, labs and discussed the procedure including the risks, benefits and alternatives for the proposed anesthesia with the patient or authorized representative who has indicated his/her understanding and acceptance.      Dental advisory given  Plan Discussed with: Anesthesiologist and CRNA  Anesthesia Plan Comments:          Anesthesia Quick Evaluation

## 2023-02-24 NOTE — Interval H&P Note (Signed)
History and Physical Interval Note:  02/24/2023 11:20 AM  Jodi Porter  has presented today for surgery, with the diagnosis of SHORTNESS OF BREATH.  The various methods of treatment have been discussed with the patient and family. After consideration of risks, benefits and other options for treatment, the patient has consented to  Procedure(s): TRANSESOPHAGEAL ECHOCARDIOGRAM (N/A) as a surgical intervention.  The patient's history has been reviewed, patient examined, no change in status, stable for surgery.  I have reviewed the patient's chart and labs.  Questions were answered to the patient's satisfaction.     Chilton Si, MD

## 2023-03-07 NOTE — Telephone Encounter (Signed)
Patient unresponsive to calls and MyChart messages, seen for TEE w/ Dr. Duke Salvia on 02/24/23. Dr. Duke Salvia recommended f/u with HTN clinic at that time, which is scheduled for 03/15/23.

## 2023-03-08 ENCOUNTER — Telehealth: Payer: Self-pay | Admitting: Cardiovascular Disease

## 2023-03-08 NOTE — Telephone Encounter (Signed)
 Call center transferred call from pt. Pt stated she has been having CP frequently for the past 3-4 days, feels numbness from her L shoulder down to her fingers. She gets SOB with exertion during any activity and has been taking Nitroglycerin  3 to 4 times a night for the past 3-4 nights. Denies using her sleep apnea machine d/t not helping her.  Advised pt to go to ED / call 911. Also advised pt to only take 3 nitroglycerins 5 mins apart. If CP continues, per protocol, pt will need to go to ED for workup/eval. She verbalized understanding and was thankful for call.

## 2023-03-08 NOTE — Telephone Encounter (Signed)
   Pt c/o of Chest Pain: STAT if active CP, including tightness, pressure, jaw pain, radiating pain to shoulder/upper arm/back, CP unrelieved by Nitro. Symptoms reported of SOB, nausea, vomiting, sweating.  1. Are you having CP right now? Yes    2. Are you experiencing any other symptoms (ex. SOB, nausea, vomiting, sweating)? Sweats, dizziness, SOB, numbness in left arm and feels faint    3. Is your CP continuous or coming and going? Come and go   4. Have you taken Nitroglycerin? Yes   5. How long have you been experiencing CP? 1 Week    6. If NO CP at time of call then end call with telling Pt to call back or call 911 if Chest pain returns prior to return call from triage team.

## 2023-03-15 ENCOUNTER — Ambulatory Visit

## 2023-03-15 ENCOUNTER — Telehealth: Payer: Self-pay | Admitting: Physician Assistant

## 2023-03-15 ENCOUNTER — Telehealth: Payer: Self-pay | Admitting: Pharmacist

## 2023-03-15 NOTE — Telephone Encounter (Signed)
Patient arrived for appointment which she thought was at 1015 but with really at 1030.  Patient went up to the desk at 1025 annoyed that she had not been seen yet reporting that she felt faint and she was going to have someone drive her to the hospital.  I was notified that patient was upset and had left.  I called patient to see if she would like to come back to be seen however she states that she is" on too much blood pressure medicine and can hardly sit up she feels very faint and feels as though she needs medical attention."  She feels as though she needs to go to the hospital and not sit in office and have somebody adjust her medications.  I advise that if she feels that poorly she should be seen at the hospital and we can follow-up with her afterwards.

## 2023-03-15 NOTE — Progress Notes (Deleted)
 Patient ID: Jodi Porter                 DOB: 1975-05-22                      MRN: [id number]      HPI: Jodi Porter is a 48 y.o. female referred by Dr. Tenny Craw to HTN clinic. PMH is significant for f CAD, HTN tobacco use, gestational DM, T2DM, and OSA. Patient has history of inferior STEMI in 10/2020. Heart cath 10/26/20 with nonobstructive CAD (50 to 60% RCA; suspect ruptured plaque with thrombus resolution vs spasm). She was seen by Dr. Tenny Craw 01/10/23 with complaints of SOB, angina and labile BP. Reports being very sensitive to medications. She had a R/L heart cath that showed non-obstructive CAD, normal right cath and severe HTN. She was started on hydralazine 25mg  TID. 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 Her systolic BP was again >200. Her hydrochlorothiazide was changed to chlorthalidone, losartan changed to olmesartan, hydralazine increased to 50mg  TID. Dr. Duke Salvia wanted to test renin and aldosterone before adding spironolactone. Repeat BMP stable.  Compliance?    Current HTN meds: amlodipine 10mg  daily, chlorthalidone 25mg  daily, hydralazine 50mg  TID, isosorbide 120mg  daily, metoprolol succinate 50mg  daily, olmesartan 40mg  daily.  Previously tried: losartan, hydrochlorothiazide,  BP goal: <130/80  Family History:   Social History:   Diet:   Exercise:  {types:28256}  Home BP readings:  Date SBP/DBP  HR                              Average      Wt Readings from Last 3 Encounters:  01/18/23 208 lb (94.3 kg)  01/10/23 208 lb 6.4 oz (94.5 kg)  03/02/22 222 lb 12.8 oz (101.1 kg)   BP Readings from Last 3 Encounters:  02/24/23 (!) 229/89  01/18/23 (!) 174/90  01/10/23 (!) 160/98   Pulse Readings from Last 3 Encounters:  02/24/23 (!) 55  01/18/23 65  01/10/23 (!) 59    Renal function: CrCl cannot be calculated (Unknown ideal weight.).  Past Medical History:  Diagnosis Date   Gestational diabetes     Gestational diabetes    Hypertension     Current Outpatient Medications on File Prior to Visit  Medication Sig Dispense Refill   amLODipine (NORVASC) 10 MG tablet Take 1 tablet (10 mg total) by mouth daily. 90 tablet 0   aspirin EC 81 MG tablet Take 81 mg by mouth as needed. Swallow whole.     atorvastatin (LIPITOR) 80 MG tablet TAKE 1 TABLET(80 MG) BY MOUTH DAILY 90 tablet 3   chlorthalidone (HYGROTON) 25 MG tablet Take 1 tablet (25 mg total) by mouth daily. 30 tablet 3   clopidogrel (PLAVIX) 75 MG tablet TAKE 1 TABLET(75 MG) BY MOUTH DAILY WITH BREAKFAST 90 tablet 3   hydrALAZINE (APRESOLINE) 25 MG tablet Take 2 tablets (50 mg total) by mouth 3 (three) times daily. 270 tablet 3   isosorbide mononitrate (IMDUR) 120 MG 24 hr tablet Take 1 tablet (120 mg total) by mouth daily. 90 tablet 0   levonorgestrel (MIRENA, 52 MG,) 20 MCG/DAY IUD Mirena 21 mcg/24 hours (8 yrs) 52 mg intrauterine device  to be inserted by provider for one dose     metoprolol succinate (TOPROL-XL) 50 MG 24 hr tablet TAKE 1 TABLET(50 MG) BY MOUTH DAILY WITH OR IMMEDIATELY  FOLLOWING A MEAL 90 tablet 3   nitroGLYCERIN (NITROSTAT) 0.4 MG SL tablet Place 1 tablet (0.4 mg total) under the tongue every 5 (five) minutes as needed. 25 tablet 5   olmesartan (BENICAR) 40 MG tablet Take 1 tablet (40 mg total) by mouth daily. 30 tablet 11   No current facility-administered medications on file prior to visit.    Allergies  Allergen Reactions   Penicillins Rash   Tramadol     Shake and get nauseated    There were no vitals taken for this visit.   Assessment/Plan: No BP recorded.  {Refresh Note OR Click here to enter BP  :1}***   1. Hypertension -  No problem-specific Assessment & Plan notes found for this encounter.      Thank you  Olene Floss, Pharm.D, BCACP, CPP [facility] of  [facility] [address] [place], [place] [address]  Phone: [phone]; Fax: [phone]

## 2023-03-15 NOTE — Telephone Encounter (Signed)
PT came on office today for appt w/PharmD, pt  checked in at 10:07am for her 10:30am appt. At 10:20am pt returned to check in counter very upset and frustrated that she hadn't seen the pharmacist yet. Advised pt that there was still 10 minutes until her actual appt time but that I would contact the pharmD to get an estimate on how much longer she would be waiting. While waiting for pharmD to answer pt grabbed her belongings and went down the elevator stating she would just go to the hospital. Advised PharmD Efraim Kaufmann of the situation and Efraim Kaufmann stated that she would reach out to pt Via telephone. EOD status for appt updated to 'left without seen'.

## 2023-03-16 ENCOUNTER — Telehealth: Payer: Self-pay | Admitting: Cardiovascular Disease

## 2023-03-16 ENCOUNTER — Other Ambulatory Visit: Payer: Self-pay | Admitting: Cardiovascular Disease

## 2023-03-16 MED ORDER — NITROGLYCERIN 0.4 MG SL SUBL: 0.4000 mg | SUBLINGUAL_TABLET | SUBLINGUAL | 5 refills | Status: DC | PRN

## 2023-03-16 MED ORDER — ISOSORBIDE MONONITRATE ER 120 MG PO TB24
120.0000 mg | ORAL_TABLET | Freq: Every day | ORAL | 1 refills | Status: DC
Start: 2023-03-16 — End: 2023-08-03

## 2023-03-16 NOTE — Telephone Encounter (Signed)
Rx refill sent to pharmacy.

## 2023-03-16 NOTE — Telephone Encounter (Signed)
*  STAT* If patient is at the pharmacy, call can be transferred to refill team.   1. Which medications need to be refilled? (please list name of each medication and dose if known)   isosorbide mononitrate (IMDUR) 120 MG 24 hr tablet    nitroGLYCERIN (NITROSTAT) 0.4 MG SL tablet     2. Would you like to learn more about the convenience, safety, & potential cost savings by using the [facility]? No   3. Are you open to using the [facility] (Type [facility].) No   4. Which pharmacy/location (including street and city if local pharmacy) is medication to be sent to? [id number] - [place], [place] - [address] AT [facility] OF [address]    5. Do they need a 30 day or 90 day supply? 90 day and 30 day  Pt is out of medications and has scheduled appt on 3/7

## 2023-03-17 NOTE — ED Provider Notes (Addendum)
 [facility]  ED Provider Note  Teila Skalsky 48 y.o. female DOB: 1975-10-02 MRN: [id number] History   Chief Complaint  Patient presents with  . Chest Pain    Pt reports chest pain x 2 weeks.  With dizziness and SHOB    Chest Pain Associated symptoms: dizziness, fatigue and shortness of breath   Associated symptoms: no palpitations    47y F here for SOB, fatigue, neck pain and dizziness x 2 weeks. Pt states since starting Chlorthalidone , Hydralazine  and Olmesartan  2 weeks ago, she has been recording her home blood pressures and has seen them as low as 90/40. Pt mentions being out of breath even when walking small distances at home. Pt states she has a radiating pain intermittently through the neck, up to the jaw. Pt has a hx of angina and was previously well-controlled with isosorbide  mononitrate only but now she needs to use her SL Nitroglycerin  as well. Pt states she has a rash that has broken out on her neck and R arm since starting the new meds as well. Pt has not had any falls/injuries and currently does not have CP. Pt quit smoking 2 weeks ago. Pt smokes marijuana about 3x week, denies alcohol use. Pt has been disabled for the last 2 years and does not work.     Past Medical History:  Diagnosis Date  . COPD (chronic obstructive pulmonary disease)  (*)   . COVID-19   . Hypertension     Past Surgical History:  Procedure Laterality Date  . Nose surgery    . Tubal ligation      Social History   Substance and Sexual Activity  Alcohol Use No   Social History   Tobacco Use  Smoking Status Former  . Current packs/day: 0.00  . Types: Cigarettes  . Quit date: 11/11/2019  . Years since quitting: 3.3  Smokeless Tobacco Never  Tobacco Comments   appox 6 day   E-Cigarettes  . Vaping Use Never User   . Start Date    . Cartridges/Day    . Quit Date     Social History   Substance and Sexual Activity  Drug Use Yes  . Types: Marijuana          Allergies  Allergen Reactions  . Penicillins Rash  . Tramadol Nausea And Vomiting    Discharge Medication List as of 03/17/2023  4:31 PM     CONTINUE these medications which have NOT CHANGED   Details  !! amLODIPine  besylate (NORVASC ) 10 mg tablet Take one tablet (10 mg dose) by mouth daily., Starting Mon 01/28/2020, Print    !! amLODIPine  besylate (NORVASC ) 5 mg tablet Take 10 mg by mouth daily., Historical Med    aspirin  (ASPRI-LOW,ECOTRIN LOW DOSE) 81 mg EC tablet Take one tablet (81 mg dose) by mouth daily., Historical Med    atorvastatin  (LIPITOR ) 80 mg tablet Take one tablet (80 mg dose) by mouth daily., Starting Thu 10/30/2020, Historical Med    chlorthalidone  25 mg tablet Take one tablet (25 mg dose) by mouth daily., Starting Thu 02/24/2023, Historical Med    clobetasol (TEMOVATE) 0.05 % ointment Apply one application. topically 2 (two) times daily., Historical Med    cloNIDine  HCl (CATAPRES  PO) Take by mouth., Historical Med    clopidogrel  bisulfate (PLAVIX ) 75 mg tablet Take one tablet (75 mg dose) by mouth daily., Starting Thu 10/30/2020, Historical Med    dexamethasone (DECADRON) 6 MG tablet Take one tablet (6 mg dose)  by mouth with breakfast., Historical Med    erythromycin (ILOTYCIN) ophthalmic ointment Place a 1/2 inch ribbon of ointment into right lower eyelid every 6 hours for 7 days, Normal    hydrALAzine  HCl (APRESOLINE ) 25 mg tablet Take two tablets (50 mg dose) by mouth 3 (three) times a day., Starting Thu 02/24/2023, Historical Med    hydrochlorothiazide  (MICROZIDE ) 12.5 MG capsule Take one capsule (12.5 mg dose) by mouth daily., Historical Med    hydrOXYzine HCl (ATARAX) 10 mg tablet Historical Med    isosorbide  mononitrate (IMDUR ) 120 MG 24 hr tablet Take one tablet (120 mg dose) by mouth daily., Starting Fri 05/01/2021, Historical Med    lisinopril  (PRINIVIL ,ZESTRIL ) 40 mg tablet Take one tablet (40 mg dose) by mouth., Historical Med     losartan  potassium (COZAAR ) 50 mg tablet Take one tablet (50 mg dose) by mouth daily., Starting Fri 12/17/2022, Historical Med    methocarbamol  (ROBAXIN ) 500 MG tablet Take one tablet (500 mg dose) by mouth 3 (three) times a day as needed., Starting Mon 02/11/2020, Print    metoprolol  succinate (TOPROL -XL) 50 mg 24 hr tablet Take one tablet (50 mg dose) by mouth daily., Starting Thu 10/30/2020, Historical Med    Multiple Vitamins-Minerals (ENDUR-VM) TBCR Take by mouth., Historical Med    nitroGLYCERIN  (NITROSTAT ) 0.4 mg SL tablet Place one tablet (0.4 mg dose) under the tongue every 5 (five) minutes as needed., Starting Wed 10/29/2020, Historical Med    olmesartan  medoxomil (BENICAR ) 40 mg tablet Take one tablet (40 mg dose) by mouth daily., Starting Thu 02/24/2023, Until Fri 02/24/2024, Historical Med    omeprazole (PRILOSEC) 20 mg capsule Take one capsule (20 mg dose) by mouth daily for 30 days., Starting Sun 03/11/2019, Until Tue 04/10/2019, Print    promethazine  (PHENERGAN ) 25 MG tablet Take one tablet (25 mg dose) by mouth every 8 (eight) hours as needed., Starting Mon 01/28/2020, Print    triamcinolone acetonide (KENALOG) 0.1% cream Apply one g (1 Application dose) topically 2 (two) times daily., Historical Med     !! - Potential duplicate medications found. Please discuss with provider.      Primary Survey   Exposure   No visible chest trauma.        Review of Systems   Review of Systems  Constitutional:  Positive for fatigue.  Respiratory:  Positive for shortness of breath.   Cardiovascular:  Negative for chest pain and palpitations.  Skin:  Positive for rash.  Neurological:  Positive for dizziness.    Physical Exam   ED Triage Vitals [03/17/23 1148]  BP 125/66  Heart Rate 71  Resp 20  SpO2 99 %  Temp 98.2 F (36.8 C)    Physical Exam  Constitutional: She appears well-developed and well-nourished.  Neck: Neck supple.  Cardiovascular: Normal rate and regular  rhythm.  Pulmonary/Chest: Breath sounds normal. No visible chest trauma.  Musculoskeletal:     Cervical back: Neck supple.   Neurological: She is alert and oriented to person, place, and time.  Skin: Skin is warm. Skin is dry. Rash noted.  Scattered 5-32mm papular rash along submandibular area and posterior R upper arm.  Psychiatric: She has a normal mood and affect. Her behavior is normal.    ED Course   Lab results:   GEN5 CARDIAC TROPONIN T (TNT5) BASELINE - Abnormal      Result Value   TnT-Gen5 (0hr) 14 (*)    Comment: An elevated Troponin indicates myocardial damage. Elevated troponin may also be  due to pulmonary emboli, aortic dissection, heart failure, trauma, toxins and ischemia in the setting of critical illness.   COMPREHENSIVE METABOLIC PANEL - Abnormal   Na 138     Potassium 3.6 (*)    Cl 100     CO2 27     AGAP 11     Glucose 111 (*)    BUN 18     Creatinine 1.56 (*)    Ca 9.5     ALK PHOS 92     T Bili 0.2     Total Protein 7.5     Alb 3.7     GLOBULIN 3.8     ALBUMIN/GLOBULIN RATIO 1.0 (*)    BUN/CREAT RATIO 11.5     ALT 13     AST 19     eGFR 41 (*)    Comment: Normal GFR (glomerular filtration rate) > 60 mL/min/1.73 meters squared, < 60 may include impaired kidney function. Calculation based on the Chronic Kidney Disease Epidemiology Collaboration (CK-EPI)equation refit without adjustment for race.  GEN5 CARDIAC TROPONIN T(TNT5) 3 HOUR - Abnormal   TnT-Gen5 (3hr) 14 (*)    Comment: An elevated Troponin indicates myocardial damage. Elevated troponin may also be due to pulmonary emboli, aortic dissection, heart failure, trauma, toxins and ischemia in the setting of critical illness.   Delta 3 Hour 0    MAGNESIUM - Normal   Mg 2.3    HUMAN CHORIONIC GONADOTROPIN (HCG), BETA-SUBUNIT, QUALITATIVE - Normal   Beta HCG Qual Negative    GEN5 CARDIAC TROPONIN T(TNT5) 1 HOUR - Normal   TnT-Gen5 (1hr) 13     Comment: An elevated Troponin indicates myocardial  damage. Elevated troponin may also be due to pulmonary emboli, aortic dissection, heart failure, trauma, toxins and ischemia in the setting of critical illness.   Delta 1 Hour -1    CBC AND DIFFERENTIAL   WBC 7.0     RBC 5.01     HGB 13.4     HCT 41.5     MCV 82.8     MCH 26.7     MCHC 32.3     Plt Ct 171     RDW SD 42.6     MPV 11.3     NRBC% 0.0     Absolute NRBC Count 0.00     NEUTROPHIL % 53.0     LYMPHOCYTE % 37.9     MONOCYTE % 7.5     Eosinophil % 0.9     BASOPHIL % 0.6     IG% 0.1     ABSOLUTE NEUTROPHIL COUNT 3.70     ABSOLUTE LYMPHOCYTE COUNT 2.64     Absolute Monocyte Count 0.52     Absolute Eosinophil Count 0.06     Absolute Basophil Count 0.04     Absolute Immature Granulocyte Count 0.01     Narrative:    Smear reviewed.  LIGHT BLUE TOP  GOLD SST    Imaging:   XR CHEST PA AND LATERAL   Narrative:    Clinical information: Chest Pain    Comparison: 08/20/2021  Technique: 2 view chest  Findings:   Heart size and pulmonary vascularity are normal. Lungs are clear.. Mediastinal contour is normal.    Impression:    Impression:  No acute finding.  Electronically Signed by: Wash Pereyra on 03/17/2023 12:04 PM    ECG: ECG Results          ECG 12 lead (Final result)  Result time 03/18/23 07:12:16  Final result             Narrative:   Diagnosis Class Abnormal Acquisition Device D3K Ventricular Rate 60 Atrial Rate 60 P-R Interval 138 QRS Duration 104 Q-T Interval 446 QTC Calculation(Bazett) 446 Calculated P Axis 55 Calculated R Axis -6 Calculated T Axis 171  Diagnosis Normal sinus rhythm Left ventricular hypertrophy with repolarization abnormality Abnormal ECG  Wahid, Asif ([id number]) on 03/18/2023 7:12:10 AM certifies that he/she has reviewed the ECG tracing and confirms the independent  interpretation is correct.                         ECG 12 lead (Final result)  Result time 03/18/23 07:11:57    Final result              Narrative:   Diagnosis Class Abnormal Acquisition Device D3K Ventricular Rate 59 Atrial Rate 59 P-R Interval 144 QRS Duration 106 Q-T Interval 438 QTC Calculation(Bazett) 433 Calculated P Axis 54 Calculated R Axis 0 Calculated T Axis -171  Diagnosis Sinus bradycardia Left ventricular hypertrophy with repolarization abnormality Abnormal ECG No previous ECGs available Wahid, Asif ([id number]) on 03/18/2023 7:11:48 AM certifies that he/she has reviewed the ECG tracing and confirms the independent  interpretation is correct.                         ECG 12 lead (Final result)  Result time 03/18/23 07:11:41    Final result             Narrative:   Diagnosis Class Abnormal Acquisition Device MV360 Ventricular Rate 66 Atrial Rate 66 P-R Interval 140 QRS Duration 104 Q-T Interval 436 QTC Calculation(Bazett) 457 Calculated P Axis 63 Calculated R Axis 0 Calculated T Axis 173  Diagnosis Normal sinus rhythm with sinus arrhythmia Left ventricular hypertrophy with repolarization abnormality Abnormal ECG  Wahid, Asif ([id number]) on 03/18/2023 7:11:30 AM certifies that he/she has reviewed the ECG tracing and confirms the independent  interpretation is correct.                      HEAR Score History: Mostly low risk features ECG: Normal Age: 38-64 yrs Risk Factors: 1 or 2 risk factors HEAR Score Total: 2                                                        Pre-Sedation Procedures    Medical Decision Making [phone] F with low BP readings at home as well as SOB and fatigue with minimal exertion x 2 weeks. EKG and CXR normal. Recent drug changes include Chlorthalidone  25mg , Hydralazine  and Olmesartan . Rash began at the same time as starting new drug. Recommended that pt decrease dosage of Chlorthalidone  25mg  by splitting pills in half. Discussed that pt should f/u with her Cardiologist [facility]) for further management. Lab workup  showed mild hypokalemia at 3.6, initiated Potassium 20mEq PO.   Amount and/or Complexity of Data Reviewed Labs: ordered. Radiology: ordered. ECG/medicine tests: ordered.  Risk Prescription drug management.         Provider Communication  Discharge Medication List as of 03/17/2023  4:31 PM      Discharge Medication List as of 03/17/2023  4:31 PM      Discharge Medication List  as of 03/17/2023  4:31 PM      Clinical Impression Final diagnoses:  Chest pain, unspecified type    ED Disposition     ED Disposition  Discharge   Condition  Stable   Comment  --                 Follow-up Information     Victory VEAR Buss, MD.   Specialty: Obstetrics and Gynecology Contact information: [address] [place] [address] [phone]                  Electronically signed by:    Elsie JONETTA Hummer, PA-C 03/17/23 1639    Elsie JONETTA Hummer, PA-C 03/29/23 [phone]

## 2023-03-17 NOTE — ED Provider Notes (Signed)
 [facility]  ED Progress Note   EKG my interpretation: Sinus rhythm at 66, normal axis, normal intervals, no ST elevation or ST depression, there are some nonspecific ST findings and T wave inversions consistent with LVH with strain.  Impression: EKG consistent with LVH with strain   Electronically Signed by:   Norleen JONETTA Hopes, MD 03/17/23 1153

## 2023-03-24 ENCOUNTER — Other Ambulatory Visit: Payer: Self-pay

## 2023-03-24 MED ORDER — AMLODIPINE BESYLATE 10 MG PO TABS: 10.0000 mg | ORAL_TABLET | Freq: Every day | ORAL | 3 refills | Status: DC

## 2023-04-08 ENCOUNTER — Encounter (HOSPITAL_BASED_OUTPATIENT_CLINIC_OR_DEPARTMENT_OTHER): Payer: Self-pay | Admitting: Cardiovascular Disease

## 2023-04-08 ENCOUNTER — Ambulatory Visit (INDEPENDENT_AMBULATORY_CARE_PROVIDER_SITE_OTHER): Admitting: Cardiovascular Disease

## 2023-04-08 VITALS — BP 168/84 | HR 73 | Ht 65.0 in | Wt 210.7 lb

## 2023-04-08 DIAGNOSIS — E118 Type 2 diabetes mellitus with unspecified complications: Secondary | ICD-10-CM

## 2023-04-08 DIAGNOSIS — R350 Frequency of micturition: Secondary | ICD-10-CM

## 2023-04-08 DIAGNOSIS — I10 Essential (primary) hypertension: Secondary | ICD-10-CM

## 2023-04-08 DIAGNOSIS — G4733 Obstructive sleep apnea (adult) (pediatric): Secondary | ICD-10-CM

## 2023-04-08 DIAGNOSIS — Q244 Congenital subaortic stenosis: Secondary | ICD-10-CM

## 2023-04-08 DIAGNOSIS — I2119 ST elevation (STEMI) myocardial infarction involving other coronary artery of inferior wall: Secondary | ICD-10-CM

## 2023-04-08 DIAGNOSIS — E785 Hyperlipidemia, unspecified: Secondary | ICD-10-CM

## 2023-04-08 HISTORY — DX: Congenital subaortic stenosis: Q24.4

## 2023-04-08 MED ORDER — RANOLAZINE ER 500 MG PO TB12: 500.0000 mg | ORAL_TABLET | Freq: Two times a day (BID) | ORAL | 3 refills | Status: DC

## 2023-04-08 NOTE — Progress Notes (Signed)
 [facility] Initial Assessment:    Date:  04/08/2023   ID:  Jodi Porter, DOB [birth year], MRN [id number]  PCP:  Alm Bustard, MD  Cardiologist:  Meriam Sprague, MD (Inactive)  Nephrologist:  Referring MD: Jodi Inch, MD   CC: Hypertension  History of Present Illness:    Jodi Porter is a 48 y.o. female with a hx of CAD status post STEMI, diabetes, hypertension, hyperlipidemia, OSA, and obesity here to establish care in the [facility].  She was previously a patient of Dr. Katrinka Blazing.  She was admitted 10/2020 with STEMI.  She was found to have nonobstructive disease (50-60% RCA).  This was thought to be a ruptured plaque with thrombus resolution versus spasm.  She was medically managed.  She followed up with recurrent chest pain 11/2020 and cath was similar.  She is struggled with hypertension.  Blood pressures have been labile ranging from the 90s to 200s.  She is also struggled to tolerate CPAP.  At her visit with Dr. Tenny Craw 10/2022 blood pressure was uncontrolled and losartan was increased.  She was also referred for repeat right and left heart catheterization 01/2023 revealed nonobstructive coronary disease with mild plaque in the LAD and left circumflex and mild to moderate disease in the RCA.  Systolic blood pressures over 409.  Echo revealed LVEF 60-65% with moderate LVH and grade 2 diastolic dysfunction.  There was moderate to severe aortic regurgitation.  Gradients across the valve were 37 mmHg.  There was suspicion for subvalvular stenosis.  She was referred for TEE 02/2023 at which time she was noted to have a subvalvular membrane with a mean gradient of 28 mmHg.  Blood pressures were again over 200.  HCTZ and losartan were discontinued.  She was switched to chlorthalidone and olmesartan.  Hydralazine was increased.  Renin and aldosterone orders were placed but not obtained. Jodi Porter was seen at the ED at [facility] on 03/17/2023 for chest  pain.  Blood pressure was 122/70 at the time.  Cardiac enzymes were negative.  They were concerned her blood pressures may be dropping too low and advised her to reduce the chlorthalidone to 12.5 mg.  Jodi Porter has a history of hypertension, previously experiencing very high blood pressure readings up to 200 mmHg. Currently, she is experiencing episodes of hypotension with readings as low as 90/40 mmHg, leading to dizziness, fatigue, and difficulty eating. Her blood pressure was 148/87 mmHg this morning before taking her medication.  She experiences angina, particularly with exertion, leading to shortness of breath and chest pain. 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 She has not worked in two years due to these symptoms. She sometimes requires nitroglycerin for relief of chest pain and tightness.  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 She experiences shortness of breath, especially when walking uphill or performing physical activities, which has been ongoing since her heart attack.  She experiences frequent urination at night, getting up every hour, which she describes as 'tinkling a little bit.' This symptom had improved with medication initially but has since returned. She is concerned about potential kidney issues.  She reports significant limitations in her physical activity due to shortness of breath and chest pain, affecting her ability to work and engage in regular exercise.     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    Past Surgical History:  Procedure Laterality Date   CORONARY PRESSURE/FFR STUDY N/A 11/18/2020   Procedure: INTRAVASCULAR PRESSURE WIRE/FFR STUDY;  Surgeon:  Lyn Records, MD;  Location: [facility] INVASIVE CV LAB;  Service: Cardiovascular;  Laterality: N/A;   CORONARY/GRAFT ACUTE MI REVASCULARIZATION N/A 10/26/2020   Procedure: Coronary/Graft Acute MI Revascularization;  Surgeon: Lyn Records, MD;  Location: [facility] INVASIVE CV LAB;  Service: Cardiovascular;  Laterality: N/A;   ECTOPIC PREGNANCY SURGERY     LEFT HEART CATH AND CORONARY ANGIOGRAPHY N/A 10/26/2020   Procedure: LEFT HEART CATH AND CORONARY ANGIOGRAPHY;  Surgeon: Lyn Records, MD;  Location: [facility] INVASIVE CV LAB;  Service: Cardiovascular;  Laterality: N/A;   LEFT HEART CATH AND CORONARY ANGIOGRAPHY N/A 11/18/2020   Procedure: LEFT HEART CATH AND CORONARY ANGIOGRAPHY;  Surgeon: Lyn Records, MD;  Location: [facility] INVASIVE CV LAB;  Service: Cardiovascular;  Laterality: N/A;   RIGHT/LEFT HEART CATH AND CORONARY ANGIOGRAPHY N/A 01/18/2023   Procedure: RIGHT/LEFT HEART CATH AND CORONARY ANGIOGRAPHY;  Surgeon: Tonny Bollman, MD;  Location: [facility] INVASIVE CV LAB;  Service: Cardiovascular;  Laterality: N/A;   TRANSESOPHAGEAL ECHOCARDIOGRAM (CATH LAB) N/A 02/24/2023   Procedure: TRANSESOPHAGEAL ECHOCARDIOGRAM;  Surgeon: Chilton Si, MD;  Location: [facility] INVASIVE CV LAB;  Service: Cardiovascular;  Laterality: N/A;    Current Medications: Current Meds  Medication Sig   amLODipine (NORVASC) 10 MG tablet Take 1 tablet (10 mg total) by mouth daily.   aspirin EC 81 MG tablet Take 81 mg by mouth as needed. Swallow whole.   atorvastatin (LIPITOR) 80 MG tablet TAKE 1 TABLET(80 MG) BY MOUTH DAILY   chlorthalidone (HYGROTON) 25 MG tablet Take 12.5 mg by mouth daily.   clopidogrel (PLAVIX) 75 MG tablet TAKE 1 TABLET(75 MG) BY MOUTH DAILY WITH BREAKFAST   hydrALAZINE (APRESOLINE) 25 MG tablet Take 25 mg by mouth in the morning and at bedtime.   isosorbide mononitrate (IMDUR) 120 MG 24 hr tablet Take 1 tablet (120 mg total) by mouth daily.   levonorgestrel (MIRENA, 52 MG,) 20 MCG/DAY IUD Mirena 21 mcg/24 hours (8 yrs)  52 mg intrauterine device  to be inserted by provider for one dose   metoprolol succinate (TOPROL-XL) 50 MG 24 hr tablet TAKE 1 TABLET(50 MG) BY MOUTH DAILY WITH OR IMMEDIATELY FOLLOWING A MEAL   nitroGLYCERIN (NITROSTAT) 0.4 MG SL tablet Place 1 tablet (0.4 mg total) under the tongue every 5 (five) minutes as needed.   olmesartan (BENICAR) 40 MG tablet Take 1 tablet (40 mg total) by mouth daily.   ranolazine (RANEXA) 500 MG 12 hr tablet Take 1 tablet (500 mg total) by mouth 2 (two) times daily.   [DISCONTINUED] chlorthalidone (HYGROTON) 25 MG tablet Take 1 tablet (25 mg total) by mouth daily. (Patient taking differently: Take 12.5 mg by mouth daily.)   [DISCONTINUED] hydrALAZINE (APRESOLINE) 25 MG tablet Take 2 tablets (50 mg total) by mouth 3 (three) times daily. (Patient taking differently: Take 50 mg by mouth in the morning and at bedtime.)     Allergies:   Penicillins and Tramadol   Social History   Socioeconomic History   Marital status: Single    Spouse name: Not on file   Number of children: Not on file   Years of education: Not on file   Highest education level: Not on file  Occupational History   Not on  file  Tobacco Use   Smoking status: Every Day    Current packs/day: 0.50    Types: Cigarettes   Smokeless tobacco: Never  Substance and Sexual Activity   Alcohol use: No   Drug use: No   Sexual activity: Yes    Birth control/protection: I.U.D.  Other Topics Concern   Not on file  Social History Narrative   Not on file   Social Drivers of Health   Financial Resource Strain: Not on file  Food Insecurity: No Food Insecurity (07/29/2020)   Received from [facility], [facility]   Hunger Vital Sign    Worried About Running Out of Food in the Last Year: Never true    Ran Out of Food in the Last Year: Never true  Transportation Needs: Not on file  Physical Activity: Not on file  Stress: Not on file  Social Connections: Unknown (06/12/2021)   Received from [facility], [facility]   Social Network    Social Network: Not on file     Family History: The patient's family history is not on file.  ROS:   Please see the history of present illness.    All other systems reviewed and are negative.  EKGs/Labs/Other Studies Reviewed:    EKG:  EKG is not ordered today.    RHC/LHC 01/2023: 1.  Nonobstructive coronary artery disease with patency of the left main, mild nonobstructive plaquing in the LAD and left circumflex, and mild to moderate nonobstructive plaquing in the RCA unchanged from the previous cardiac catheterization study in 2022 2.  Essentially normal right heart catheterization data, preserved cardiac output of 5.6 L/min 3.  Severe systemic hypertension with systolic blood pressure greater than 200 mmHg, treated with IV labetalol and hydralazine during the procedure   Recommendations: Medical therapy, titration of antihypertensive treatment as I suspect the patient's symptoms are related to hypertensive heart disease. She has had problems with drug intolerances over time.   Echo 02/2023:  1. Left ventricular ejection fraction, by estimation, is 60 to 65%. The  left ventricle has normal function. The left ventricle has no regional  wall motion abnormalities. There is moderate left ventricular hypertrophy.  Left ventricular diastolic  parameters are consistent with Grade II diastolic dysfunction  (pseudonormalization). Elevated left atrial pressure.   2. Right ventricular systolic function is normal. The right ventricular  size is normal. There is normal pulmonary artery systolic pressure. The  estimated right ventricular systolic pressure is 28.0 mmHg.   3. The mitral valve is normal in structure. Trivial mitral valve  regurgitation.   4. The inferior vena cava is normal in size with greater than 50%  respiratory variability, suggesting right atrial pressure of 3 mmHg.   5. The aortic valve was not well visualized. Aortic valve  regurgitation  is moderate to severe. There are severely elevated gradients through  aortic valve (Vmax 4.2 m/s, MG 37 mmHg). Aortic valve is not well  visualized but appears to open on parasternal  long axis images. Suspect subvalvular stenosis, possible subaortic  membrane. Recommend TEE for further evaluation    Recent Labs: 01/11/2023: ALT 9; NT-Pro BNP 3,637; TSH 1.000 02/22/2023: BUN 11; Creatinine, Ser 1.38; Hemoglobin 13.0; Platelets 201; Potassium 4.0; Sodium 139   Recent Lipid Panel    Component Value Date/Time   CHOL 128 11/18/2020 0154   TRIG 94 11/18/2020 0154   HDL 34 (L) 11/18/2020 0154   CHOLHDL 3.8 11/18/2020 0154   VLDL 19 11/18/2020 0154   LDLCALC  75 11/18/2020 0154    Physical Exam:   VS:  BP (!) 168/84   Pulse 73   Ht 5\' 5"  (1.651 m)   Wt 210 lb 11.2 oz (95.6 kg)   SpO2 99%   BMI 35.06 kg/m  , BMI Body mass index is 35.06 kg/m. GENERAL:  Well appearing HEENT: Pupils equal round and reactive, fundi not visualized, oral mucosa unremarkable NECK:  No jugular venous distention, waveform within normal limits, carotid upstroke brisk and symmetric, no bruits, no thyromegaly LUNGS:  Clear to auscultation bilaterally HEART:  RRR.  PMI not displaced or sustained,S1 and S2 within normal limits, no S3, no S4, no clicks, no rubs, III/VI murmurs ABD:  Flat, positive bowel sounds normal in frequency in pitch, no bruits, no rebound, no guarding, no midline pulsatile mass, no hepatomegaly, no splenomegaly EXT:  2 plus pulses throughout, no edema, no cyanosis no clubbing SKIN:  No rashes no nodules NEURO:  Cranial nerves II through XII grossly intact, motor grossly intact throughout PSYCH:  Cognitively intact, oriented to person place and time   ASSESSMENT/PLAN:    # Subvalvular Membrane Identified on transesophageal echo. Moderately increased pressure across the membrane.  Mean gradient 28 mmHg. -Continue monitoring and follow-up.  # Hypertension Patient  experiencing symptoms of hypotension with current regimen, including dizziness and fatigue.  We need to bring her BP down more slowly.  Aim for SBP 140s for now given that she was previously averaging in the 200s.   -Discontinue Hydralazine. -Reduce Chlorthalidone to half dose. -continue amlodipine, Imdur, metoprolol and olmesartan -Recheck blood pressure in a few months.  # Medically managed CAD:  # Angina:  Patient reports chest pain and shortness of breath with exertion.  Unclear why given her non-obstructive disease on cath.  She gets significant relief with NTG.  -Start Ranexa 500mg  twice daily. -Consider referral to cardiac rehab for conditioning and endurance building.  # Hyperlipidemia Patient has a history of plaque and is on medication to control cholesterol. -Continue atorvastatin -Order lipid panel to monitor cholesterol levels.  # Frequent Nocturia Increased frequency of urination at night, impacting sleep. -Refer to Urology for further evaluation.  General Health Maintenance -Order comprehensive metabolic panel, renin, and aldosterone tests. -Follow-up in 1-2 months.     Screening for Secondary Hypertension:     Relevant Labs/Studies:    Latest Ref Rng & Units 02/22/2023    8:11 AM 01/18/2023   10:47 AM 01/18/2023   10:46 AM  Basic Labs  Sodium 134 - 144 mmol/L 139  140  140   Potassium 3.5 - 5.2 mmol/L 4.0  3.4  3.4   Creatinine 0.57 - 1.00 mg/dL 2.44          Latest Ref Rng & Units 01/11/2023    8:17 AM  Thyroid   TSH 0.450 - 4.500 uIU/mL 1.000     Disposition:    FU with MD/PharmD in 1-2 months   Medication Adjustments/Labs and Tests Ordered: Current medicines are reviewed at length with the patient today.  Concerns regarding medicines are outlined above.  Orders Placed This Encounter  Procedures   Lipid panel   Comprehensive metabolic panel   Aldosterone + renin activity w/ ratio   Ambulatory referral to Urology   Meds ordered this encounter   Medications   ranolazine (RANEXA) 500 MG 12 hr tablet    Sig: Take 1 tablet (500 mg total) by mouth 2 (two) times daily.    Dispense:  180 tablet  Refill:  3     Signed, Chilton Si, MD  04/08/2023 12:36 PM    [facility]

## 2023-04-08 NOTE — Patient Instructions (Signed)
 Medication Instructions:  START RANEXA 500 MG TWICE A DAY   DECREASE YOUR HYDRALAZINE TO TWICE A DAY   Labwork: LP/CMET/RENIN/ALDOSTERONE TODAY   Testing/Procedures: NONE  Follow-Up: 1 TO 2 MONTHS IN ADV HTN WITH CAITLIN W NP OR DR Cromwell   You have been referred to UROLOGY   Any Other Special Instructions Will Be Listed Below (If Applicable). MONITOR BLOOD PRESSURE TWICE A DAY AND LOG. BRING READINGS AND MACHINE TO FOLLOW UP   If you need a refill on your cardiac medications before your next appointment, please call your pharmacy.

## 2023-04-13 ENCOUNTER — Encounter (HOSPITAL_BASED_OUTPATIENT_CLINIC_OR_DEPARTMENT_OTHER): Payer: Self-pay

## 2023-04-14 LAB — COMPREHENSIVE METABOLIC PANEL
ALT: 10 IU/L (ref 0–32)
AST: 14 IU/L (ref 0–40)
Albumin: 3.9 g/dL (ref 3.9–4.9)
Alkaline Phosphatase: 114 IU/L (ref 44–121)
BUN/Creatinine Ratio: 6 — ABNORMAL LOW (ref 9–23)
BUN: 8 mg/dL (ref 6–24)
Bilirubin Total: 0.4 mg/dL (ref 0.0–1.2)
CO2: 24 mmol/L (ref 20–29)
Calcium: 9.4 mg/dL (ref 8.7–10.2)
Chloride: 101 mmol/L (ref 96–106)
Creatinine, Ser: 1.39 mg/dL — ABNORMAL HIGH (ref 0.57–1.00)
Globulin, Total: 3.5 g/dL (ref 1.5–4.5)
Glucose: 102 mg/dL — ABNORMAL HIGH (ref 70–99)
Potassium: 4 mmol/L (ref 3.5–5.2)
Sodium: 138 mmol/L (ref 134–144)
Total Protein: 7.4 g/dL (ref 6.0–8.5)
eGFR: 47 mL/min/{1.73_m2} — ABNORMAL LOW (ref >59)

## 2023-04-14 LAB — ALDOSTERONE + RENIN ACTIVITY W/ RATIO
Aldos/Renin Ratio: 2.2 (ref 0.0–30.0)
Aldosterone: 1.6 ng/dL (ref 0.0–30.0)
Renin Activity, Plasma: 0.726 ng/mL/h (ref 0.167–5.380)

## 2023-04-14 LAB — LIPID PANEL
Chol/HDL Ratio: 4.5 ratio — ABNORMAL HIGH (ref 0.0–4.4)
Cholesterol, Total: 188 mg/dL (ref 100–199)
HDL: 42 mg/dL (ref >39)
LDL Chol Calc (NIH): 131 mg/dL — ABNORMAL HIGH (ref 0–99)
Triglycerides: 83 mg/dL (ref 0–149)
VLDL Cholesterol Cal: 15 mg/dL (ref 5–40)

## 2023-04-22 ENCOUNTER — Encounter (HOSPITAL_BASED_OUTPATIENT_CLINIC_OR_DEPARTMENT_OTHER): Payer: Self-pay | Admitting: *Deleted

## 2023-04-22 ENCOUNTER — Encounter (HOSPITAL_BASED_OUTPATIENT_CLINIC_OR_DEPARTMENT_OTHER): Payer: Self-pay | Admitting: Cardiovascular Disease

## 2023-04-25 ENCOUNTER — Other Ambulatory Visit (HOSPITAL_BASED_OUTPATIENT_CLINIC_OR_DEPARTMENT_OTHER): Payer: Self-pay | Admitting: *Deleted

## 2023-04-25 DIAGNOSIS — E7800 Pure hypercholesterolemia, unspecified: Secondary | ICD-10-CM

## 2023-04-25 DIAGNOSIS — I10 Essential (primary) hypertension: Secondary | ICD-10-CM

## 2023-04-25 DIAGNOSIS — Z5181 Encounter for therapeutic drug level monitoring: Secondary | ICD-10-CM

## 2023-04-25 DIAGNOSIS — I25118 Atherosclerotic heart disease of native coronary artery with other forms of angina pectoris: Secondary | ICD-10-CM

## 2023-05-26 ENCOUNTER — Ambulatory Visit (HOSPITAL_BASED_OUTPATIENT_CLINIC_OR_DEPARTMENT_OTHER): Admitting: Family

## 2023-05-26 VITALS — BP 159/79 | HR 64 | Ht 65.0 in | Wt 215.0 lb

## 2023-05-26 DIAGNOSIS — I25118 Atherosclerotic heart disease of native coronary artery with other forms of angina pectoris: Secondary | ICD-10-CM

## 2023-05-26 DIAGNOSIS — I1A0 Resistant hypertension: Secondary | ICD-10-CM

## 2023-05-26 DIAGNOSIS — E785 Hyperlipidemia, unspecified: Secondary | ICD-10-CM | POA: Diagnosis not present

## 2023-05-26 MED ORDER — NITROGLYCERIN 0.4 MG SL SUBL: 0.4000 mg | SUBLINGUAL_TABLET | SUBLINGUAL | 5 refills | Status: DC | PRN

## 2023-05-26 NOTE — Patient Instructions (Addendum)
 Medication Instructions:  START NEW SCHEDULE FRIDAY 05/27/23 ____________________________________________________________________________  AM: Hydralazine  25mg , Imdur  120mg , Olmesartan  40mg , Chlorthalidone  (half tablet) 12.5mg    PM: Hydralazine  25mg , Metoprolol  (Toprol ) 50mg , Amlodipine  10mg  ______________________________________________________________________________  After one week, add Ranexa  in the morning and evening   Follow-Up: Please follow up in 1-2 months in ADV HTN CLINIC with Dr. Theodis Fiscal, Neomi Banks, NP or Donivan Furry PharmD

## 2023-05-26 NOTE — Progress Notes (Signed)
 Advanced Hypertension Clinic Assessment:    Date:  05/30/2023   ID:  ZARYIAH BENDURE, DOB 1975-06-10, MRN [id number]  PCP:  Alva Jewels, MD  Cardiologist:  Sonny Dust, MD (Inactive)  Nephrologist:  Referring MD: Alva Jewels, MD   CC: Hypertension  History of Present Illness:    Jodi Porter is a 48 y.o. female with a hx of CAD s/p STEMI (10/2020 nonobstructive disease 50-60% RCA stenosis ruptured plaque with thrombus resolution vs spasm managed medically  [facility] 01/2023 nonbstructive disease in LAD, LCx, mild to moderate disease in RCA), DM2, HTN, HLD, OSA, subvalvular membrane,  obesity here to follow up in the Advanced Hypertension Clinic.   Echo 01/2023 LVEF 60-65%, gr2dd, moderate to severe AI with gradient of . TEE 02/2023 subvalvular membrane with mean gradient 28 mmHg.  Due to BP persistently greater than 200 HCTZ and losartan  discontinued.  Chlorthalidone . olmesartan  started.  Hydralazine  increased.  Established with [facility] Dr. Theodis Fiscal 04/08/23. Previous hypertension with BP up to 200 mm recurrent then with episode of hypotension 90/40. She noted persistent chest pain, exertional dyspnea, nocturia.  Hydralazine  reduced to BID, chlorthalidone  reduced to half tablet.  Amlodipine , and dry, metoprolol , olmesartan  continued.  She was recommended to start Ranexa  500 mg twice daily for chest pain.  Labs 04/08/23 with no hyperaldosteronism.   Today for follow-up with her mother  Reports she is "always having chest pain" most often when laying down which improves when she sits up in her adjustable bed. Using PRN nitroglycerin  with activity. BP at home 135-150 at home then after taking her medication will decrease, by 8-9pm feels she has no energy. She is experiencing dizziness when her blood pressure drops low. Similar to last office visit.  She has continued Hydralazine  TID, did not realize instructions were to reduce. She only tried one dose of  Ranexa  and still required PRN nitroglycerin  so did not feel it worked - discussed need to try medications for at least a week to see efficacy. Mom notes concerns about numbness and lack of feeling in Mrs. Demery's right hand. Discussed likely pinched nerve and encouraged heat pack and gentle stretching.  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    Past Surgical History:  Procedure Laterality Date   CORONARY PRESSURE/FFR STUDY N/A 11/18/2020   Procedure: INTRAVASCULAR PRESSURE WIRE/FFR STUDY;  Surgeon: Arty Binning, MD;  [facility] INVAS[facility] CV LAB;  Service: Cardiovascular;  Laterality: N/A;   CORONARY/GRAFT ACUTE MI REVASCULARIZATION N/A 10/26/2020   Procedure: Coronary/Graft Acute MI Revascularization;  Surgeon: Arty Binning, MD;  Location: [facility] INVASIVE CV LAB;  Service: Cardiovascular;  Laterality: N/A;   ECTOPIC PREGNANCY SURGERY     LEFT HEART CATH AND CORONARY ANGIOGRAPHY N/A 10/26/2020   Procedure: LEFT HEART CATH AND CORONARY ANGIOGRAPHY;  Surgeon: Arty Binning, MD;  [facility]: [facility] CV LAB;  Service: Cardiovascular;  Laterality: N/A;   LEFT HEART CATH AND CORONARY ANGIOGRAPHY N/A 11/18/2020   Procedure: LEFT HEART CATH AND CORONARY ANGIOGRAPHY;  Surgeon: Arty Binning, MD;  [facility]: [facility];  Service: Cardiovascular;  Laterality: N/A;   RIGHT/LEFT HEART CATH AND CORONARY ANGIOGRAPHY N/A 01/18/2023   Procedure: RIGHT/LEFT HEART CATH AND CORONARY ANGIOGRAPHY;  Surgeon: Arnoldo Lapping, MD;  Location: [facility] INVASIVE CV LAB;  Service: Cardiovascular;  Laterality: N/A;  TRANSESOPHAGEAL ECHOCARDIOGRAM (CATH LAB) N/A 02/24/2023   Procedure: TRANSESOPHAGEAL ECHOCARDIOGRAM;  Surgeon: Maudine Sos, MD;  Location: [facility] INVASIVE CV LAB;  Service: Cardiovascular;  Laterality: N/A;     Current Medications: Current Meds  Medication Sig   amLODipine  (NORVASC ) 10 MG tablet Take 1 tablet (10 mg total) by mouth daily.   aspirin  EC 81 MG tablet Take 81 mg by mouth as needed. Swallow whole.   atorvastatin  (LIPITOR ) 80 MG tablet TAKE 1 TABLET(80 MG) BY MOUTH DAILY   chlorthalidone  (HYGROTON ) 25 MG tablet Take 12.5 mg by mouth daily.   clopidogrel  (PLAVIX ) 75 MG tablet TAKE 1 TABLET(75 MG) BY MOUTH DAILY WITH BREAKFAST   isosorbide  mononitrate (IMDUR ) 120 MG 24 hr tablet Take 1 tablet (120 mg total) by mouth daily.   levonorgestrel (MIRENA, 52 MG,) 20 MCG/DAY IUD Mirena 21 mcg/24 hours (8 yrs) 52 mg intrauterine device  to be inserted by provider for one dose   metoprolol  succinate (TOPROL -XL) 50 MG 24 hr tablet TAKE 1 TABLET(50 MG) BY MOUTH DAILY WITH OR IMMEDIATELY FOLLOWING A MEAL   olmesartan  (BENICAR ) 40 MG tablet Take 1 tablet (40 mg total) by mouth daily.   [DISCONTINUED] nitroGLYCERIN  (NITROSTAT ) 0.4 MG SL tablet Place 1 tablet (0.4 mg total) under the tongue every 5 (five) minutes as needed.     Allergies:   Penicillins and Tramadol   Social History   Socioeconomic History   Marital status: Single    Spouse name: Not on file   Number of children: Not on file   Years of education: Not on file   Highest education level: Not on file  Occupational History   Not on file  Tobacco Use   Smoking status: Every Day    Current packs/day: 0.50    Types: Cigarettes   Smokeless tobacco: Never  Substance and Sexual Activity   Alcohol use: No   Drug use: No   Sexual activity: Yes    Birth control/protection: I.U.D.  Other Topics Concern   Not on file  Social History Narrative   Not on file   Social Drivers of Health   Financial Resource Strain: Not on file  Food Insecurity: No Food Insecurity (07/29/2020)   Received from [facility], [facility]   Hunger Vital Sign    Worried About Running Out of Food in the Last Year: Never true    Ran Out of Food in the  Last Year: Never true  Transportation Needs: Not on file  Physical Activity: Not on file  Stress: Not on file  Social Connections: Unknown (06/12/2021)   Received from [facility], [facility]   Social Network    Social Network: Not on file     Family History: The patient's family history is not on file.  ROS:   Please see the history of present illness.     All other systems reviewed and are negative.  EKGs/Labs/Other Studies Reviewed:         Recent Labs: 01/11/2023: NT-Pro BNP 3,637; TSH 1.000 02/22/2023: Hemoglobin 13.0; Platelets 201 04/08/2023: ALT 10; BUN 8; Creatinine, Ser 1.39; Potassium 4.0; Sodium 138   Recent Lipid Panel    Component Value Date/Time   CHOL 188 04/08/2023 0911   TRIG 83 04/08/2023 0911   HDL 42 04/08/2023 0911   CHOLHDL 4.5 (H) 04/08/2023 0911   CHOLHDL 3.8 11/18/2020 0154   VLDL 19 11/18/2020 0154   LDLCALC 131 (H) 04/08/2023 0911    Physical Exam:   VS:  BP Aaron Aas)  159/79 (BP Location: Left Arm, Patient Position: Sitting)   Pulse 64   Ht 5\' 5"  (1.651 m)   Wt 215 lb (97.5 kg)   BMI 35.78 kg/m  , BMI Body mass index is 35.78 kg/m. GENERAL:  Well appearing, overweight HEENT: Pupils equal round and reactive, fundi not visualized, oral mucosa unremarkable NECK:  No jugular venous distention, waveform within normal limits, carotid upstroke brisk and symmetric, no bruits, no thyromegaly LYMPHATICS:  No cervical adenopathy LUNGS:  Clear to auscultation bilaterally HEART:  RRR.  PMI not displaced or sustained,S1 and S2 within normal limits, no S3, no S4, no clicks, no rubs, no murmurs ABD:  Flat, positive bowel sounds normal in frequency in pitch, no bruits, no rebound, no guarding, no midline pulsatile mass, no hepatomegaly, no splenomegaly EXT:  2 plus pulses throughout, no edema, no cyanosis no clubbing SKIN:  No rashes no nodules NEURO:  Cranial nerves II through XII grossly intact, motor grossly intact throughout PSYCH:  Cognitively  intact, oriented to person place and time   ASSESSMENT/PLAN:    HTN - BP not at goal <130/80. Bothered by hypotension symptoms, fatigue at home.  Reduce hydralazine  25 mg twice daily. Adjust medication schedule to avoid taking all antihypertensives in the morning: Move metoprolol  succinate 50 mg and amlodipine  mg to evening dosing. Continue Imdur  120 mg, olmesartan  40 mg, chlorthalidone  12.5 mg in the morning. Discussed to monitor BP at home at least 2 hours after medications and sitting for 5-10 minutes.   Subvalvular membrane - Noted by TEE 02/2023. Continue to monitor.   Nonobstructive CAD/HLD, LDL goal less than 70 - Persistent chest pain out of proportion to CAD by [facility] 01/2023. She only tried one dose of Ranexa , agreeable to resume. Changes to BP regimen, as above. After one week on new antihypertensive regimen she will add Ranexa  500mg  BID.  Making medication adjustments slowly due to her prior intolerances/sensitivities to medications.    Screening for Secondary Hypertension:     Relevant Labs/Studies:    Latest Ref Rng & Units 04/08/2023    9:11 AM 02/22/2023    8:11 AM 01/18/2023   10:47 AM  Basic Labs  Sodium 134 - 144 mmol/L 138  139  140   Potassium 3.5 - 5.2 mmol/L 4.0  4.0  3.4   Creatinine 0.57 - 1.00 mg/dL 2.95  2.84         Latest Ref Rng & Units 01/11/2023    8:17 AM  Thyroid   TSH 0.450 - 4.500 uIU/mL 1.000        Latest Ref Rng & Units 04/08/2023    9:11 AM  Renin/Aldosterone   Aldosterone 0.0 - 30.0 ng/dL 1.6   Aldos/Renin Ratio 0.0 - 30.0 2.2              Disposition:    FU with MD/APP/PharmD in 1-2 months    Medication Adjustments/Labs and Tests Ordered: Current medicines are reviewed at length with the patient today.  Concerns regarding medicines are outlined above.  No orders of the defined types were placed in this encounter.  Meds ordered this encounter  Medications   nitroGLYCERIN  (NITROSTAT ) 0.4 MG SL tablet    Sig: Place 1 tablet (0.4  mg total) under the tongue every 5 (five) minutes as needed.    Dispense:  25 tablet    Refill:  5     Signed, Clearnce Curia, NP  05/30/2023 1:06 PM    [facility]

## 2023-05-30 ENCOUNTER — Encounter (HOSPITAL_BASED_OUTPATIENT_CLINIC_OR_DEPARTMENT_OTHER): Payer: Self-pay | Admitting: Family

## 2023-08-03 ENCOUNTER — Other Ambulatory Visit: Payer: Self-pay

## 2023-08-03 ENCOUNTER — Other Ambulatory Visit (HOSPITAL_COMMUNITY): Payer: Self-pay

## 2023-08-03 ENCOUNTER — Ambulatory Visit (HOSPITAL_BASED_OUTPATIENT_CLINIC_OR_DEPARTMENT_OTHER): Admitting: Family

## 2023-08-03 VITALS — BP 200/90 | HR 95 | Ht 65.0 in | Wt 217.0 lb

## 2023-08-03 DIAGNOSIS — I10 Essential (primary) hypertension: Secondary | ICD-10-CM | POA: Diagnosis not present

## 2023-08-03 DIAGNOSIS — I25118 Atherosclerotic heart disease of native coronary artery with other forms of angina pectoris: Secondary | ICD-10-CM | POA: Diagnosis not present

## 2023-08-03 DIAGNOSIS — Z720 Tobacco use: Secondary | ICD-10-CM

## 2023-08-03 DIAGNOSIS — E785 Hyperlipidemia, unspecified: Secondary | ICD-10-CM | POA: Diagnosis not present

## 2023-08-03 DIAGNOSIS — Z79899 Other long term (current) drug therapy: Secondary | ICD-10-CM

## 2023-08-03 MED ORDER — ATORVASTATIN CALCIUM 80 MG PO TABS
80.0000 mg | ORAL_TABLET | Freq: Every day | ORAL | 3 refills | Status: DC
  Filled 2023-08-03: qty 30, 30d supply, fill #0
  Filled 2023-08-03: qty 90, 90d supply, fill #0

## 2023-08-03 MED ORDER — NITROGLYCERIN 0.4 MG SL SUBL
0.4000 mg | SUBLINGUAL_TABLET | SUBLINGUAL | 5 refills | Status: DC | PRN
  Filled 2023-08-03 (×2): qty 25, 1d supply, fill #0
  Filled 2023-09-23: qty 25, 1d supply, fill #1
  Filled 2023-10-12: qty 25, 7d supply, fill #2
  Filled 2023-10-12: qty 25, 1d supply, fill #2
  Filled 2023-11-20: qty 25, 7d supply, fill #3
  Filled 2023-11-21 – 2023-12-12 (×2): qty 25, 7d supply, fill #0
  Filled 2024-01-23: qty 25, 7d supply, fill #1

## 2023-08-03 MED ORDER — HYDRALAZINE HCL 25 MG PO TABS
25.0000 mg | ORAL_TABLET | Freq: Two times a day (BID) | ORAL | 1 refills | Status: DC
  Filled 2023-08-03: qty 60, 30d supply, fill #0
  Filled 2023-08-03 – 2023-08-24 (×2): qty 180, 90d supply, fill #0
  Filled 2023-08-30: qty 60, 30d supply, fill #0

## 2023-08-03 MED ORDER — OLMESARTAN MEDOXOMIL 40 MG PO TABS
40.0000 mg | ORAL_TABLET | Freq: Every morning | ORAL | 1 refills | Status: DC
  Filled 2023-08-03: qty 90, 90d supply, fill #0
  Filled 2023-08-03: qty 30, 30d supply, fill #0
  Filled 2023-08-24: qty 90, 90d supply, fill #0
  Filled 2023-08-30: qty 30, 30d supply, fill #0

## 2023-08-03 MED ORDER — METOPROLOL SUCCINATE ER 25 MG PO TB24
25.0000 mg | ORAL_TABLET | Freq: Every day | ORAL | 1 refills | Status: DC
  Filled 2023-08-03: qty 30, 30d supply, fill #0
  Filled 2023-08-03: qty 90, 90d supply, fill #0

## 2023-08-03 MED ORDER — ISOSORBIDE MONONITRATE ER 120 MG PO TB24
120.0000 mg | ORAL_TABLET | Freq: Every morning | ORAL | 1 refills | Status: DC
  Filled 2023-08-03: qty 30, 30d supply, fill #0
  Filled 2023-08-03: qty 90, 90d supply, fill #0

## 2023-08-03 MED ORDER — AMLODIPINE BESYLATE 5 MG PO TABS
5.0000 mg | ORAL_TABLET | Freq: Every evening | ORAL | 1 refills | Status: DC
  Filled 2023-08-03: qty 90, 90d supply, fill #0
  Filled 2023-08-03: qty 30, 30d supply, fill #0

## 2023-08-03 NOTE — Patient Instructions (Addendum)
 Medication Instructions:  We have sent prescriptions to [facility] for pill packs and delivery   See med list   No longer taking- renexa, plavix , or chlorthalidone      Follow-Up: Please follow up in 2 weeks in ADV HTN CLINIC with Dr. Raford, Reche Finder, NP or Allean Mink PharmD    Special Instructions:  [facility] Address: [address] [place], [place], [place] [address] Phone: [phone]

## 2023-08-03 NOTE — Progress Notes (Signed)
 Advanced Hypertension Clinic Assessment:    Date:  08/07/2023   ID:  Sunday Jodi Porter, DOB 16-Dec-1975, MRN [id number]  PCP:  [name], MD  Cardiologist:  Annabella Scarce, MD  Nephrologist:  Referring MD: [name], MD   CC: Hypertension  History of Present Illness:    Jodi Porter is a 48 y.o. female with a hx of CAD s/p STEMI (10/2020 nonobstructive disease 50-60% RCA stenosis ruptured plaque with thrombus resolution vs spasm managed medically  [facility] 01/2023 nonbstructive disease in LAD, LCx, mild to moderate disease in RCA), DM2, HTN, HLD, OSA, subvalvular membrane,  obesity here to follow up in the Advanced Hypertension Clinic.   Echo 01/2023 LVEF 60-65%, gr2dd, moderate to severe AI with gradient of . TEE 02/2023 subvalvular membrane with mean gradient 28 mmHg.  Due to BP persistently greater than 200 HCTZ and losartan  discontinued.  Chlorthalidone . olmesartan  started.  Hydralazine  increased.  Established with [facility] Dr. Scarce 04/08/23. Previous hypertension with BP up to 200 mm recurrent then with episode of hypotension 90/40. She noted persistent chest pain, exertional dyspnea, nocturia.  Hydralazine  reduced to BID, chlorthalidone  reduced to half tablet.  Amlodipine , and dry, metoprolol , olmesartan  continued.  She was recommended to start Ranexa  500 mg twice daily for chest pain.  Labs 04/08/23 with no hyperaldosteronism.   Last seen 08/03/23 with persistent chest pain when laying down which improved with sitting up. BP at home 135-150. She had not reduced Hydralazine  as previously recommended and had only taken one tablet of Ranexa . Medication schedule adjusted so as to not take all her antihypertensives at once and Ranexa  resumed.   History of Present Illness Jodi Porter is a 48 year old female with hypertension and coronary artery disease who presents with medication management issues and chest pain.  She experiences difficulties  with her antihypertensive medication regimen due to insurance and refill issues. Her medications include metoprolol , amlodipine , chlorthalidone , hydralazine , isosorbide , and olmesartan . See dispense report below.She has difficulty swallowing pills, particularly Ranexa , which she sometimes crushes. She puts her various prescriptions into one pill bottle to carry in her purse with her. She monitors her blood pressure at home, reporting good numbers, but staggers her medication intake to avoid hypotensive symptoms.   Her chest pain is exertional, accompanied by shortness of breath and chest tightness, and is relieved by nitroglycerin . She describes feeling 'crashness' when her blood pressure drops too low, as her body is accustomed to functioning with high blood pressure. She has been using nitroglycerin  more frequently due to the lack of isosorbide .  She lives with her family, including her husband and son with autism, which adds to her daily stress. Her husband assists her with tasks such as crushing pills.   She also notes needing dental work, discussed could not provide clearance until BP control improves.   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    Past Surgical History:  Procedure Laterality Date  CORONARY PRESSURE/FFR STUDY N/A 11/18/2020   Procedure: INTRAVASCULAR PRESSURE WIRE/FFR STUDY;  Surgeon: Claudene Victory ORN, MD;   Location: [facility] INVASIVE CV LAB;  Service: Cardiovascular;  Laterality: N/A;   CORONARY/GRAFT ACUTE MI REVASCULARIZATION N/A 10/26/2020   Procedure: Coronary/Graft Acute MI Revascularization;  Surgeon: Claudene Victory ORN, MD;  Location: [facility] INVASIVE CV LAB;  Service: Cardiovascular;  Laterality: N/A;   ECTOPIC PREGNANCY SURGERY     LEFT HEART CATH AND CORONARY ANGIOGRAPHY N/A 10/26/2020   Procedure: LEFT HEART CATH AND CORONARY ANGIOGRAPHY;  Surgeon: Claudene Victory ORN, MD;  Location: [facility] INVASIVE CV LAB;  Service: Cardiovascular;  Laterality: N/A;   LEFT HEART CATH AND CORONARY ANGIOGRAPHY N/A 11/18/2020   Procedure: LEFT HEART CATH AND CORONARY ANGIOGRAPHY;  Surgeon: Claudene Victory ORN, MD;  Location: [facility]VAS[facility] CV LAB;  Service: Cardiovascular;  Laterality: N/A;   RIGHT/LEFT HEART CATH AND CORONARY ANGIOGRAPHY N/A 01/18/2023   Procedure: RIGHT/LEFT HEART CATH AND CORONARY ANGIOGRAPHY;  Surgeon: Wonda Sharper, MD;  Location: [facility] INVASIVE CV LAB;  Service: Cardiovascular;  Laterality: N/A;   TRANSESOPHAGEAL ECHOCARDIOGRAM (CATH LAB) N/A 02/24/2023   Procedure: TRANSESOPHAGEAL ECHOCARDIOGRAM;  Surgeon: Raford Riggs, MD;  Location: [facility] INVASIVE CV LAB;  Service: Cardiovascular;  Laterality: N/A;    Current Medications: Current Meds  Medication Sig   aspirin  EC 81 MG tablet Take 81 mg by mouth as needed. Swallow whole.   levonorgestrel (MIRENA, 52 MG,) 20 MCG/DAY IUD Mirena 21 mcg/24 hours (8 yrs) 52 mg intrauterine device  to be inserted by provider for one dose   [DISCONTINUED] amLODipine  (NORVASC ) 10 MG tablet Take 1 tablet (10 mg total) by mouth daily.   [DISCONTINUED] atorvastatin  (LIPITOR ) 80 MG tablet TAKE 1 TABLET(80 MG) BY MOUTH DAILY   [DISCONTINUED] chlorthalidone  (HYGROTON ) 25 MG tablet Take 12.5 mg by mouth daily.   [DISCONTINUED] clopidogrel  (PLAVIX ) 75 MG tablet TAKE 1 TABLET(75 MG) BY MOUTH DAILY WITH BREAKFAST   [DISCONTINUED] hydrALAZINE  (APRESOLINE ) 25 MG tablet Take 25 mg by mouth in the  morning and at bedtime.   [DISCONTINUED] isosorbide  mononitrate (IMDUR ) 120 MG 24 hr tablet Take 120 mg by mouth daily.   [DISCONTINUED] metoprolol  succinate (TOPROL -XL) 50 MG 24 hr tablet TAKE 1 TABLET(50 MG) BY MOUTH DAILY WITH OR IMMEDIATELY FOLLOWING A MEAL   [DISCONTINUED] nitroGLYCERIN  (NITROSTAT ) 0.4 MG SL tablet Place 1 tablet (0.4 mg total) under the tongue every 5 (five) minutes as needed.   [DISCONTINUED] olmesartan  (BENICAR ) 40 MG tablet Take 1 tablet (40 mg total) by mouth daily.     Allergies:   Penicillins and Tramadol   Social History   Socioeconomic History   Marital status: Single    Spouse name: Not on file   Number of children: Not on file   Years of education: Not on file   Highest education level: Not on file  Occupational History   Not on file  Tobacco Use   Smoking status: Every Day    Current packs/day: 0.50    Types: Cigarettes   Smokeless tobacco: Never  Substance and Sexual Activity   Alcohol use: No   Drug use: No   Sexual activity: Yes    Birth control/protection: I.U.D.  Other Topics Concern   Not on file  Social History Narrative   Not on file   Social Drivers of Health   Financial Resource Strain: Not on file  Food Insecurity: Food Insecurity Present (08/04/2023)   Hunger Vital Sign    Worried About Running Out of Food in the Last  Year: Sometimes true    Ran Out of Food in the Last Year: Never true  Transportation Needs: Not on file  Physical Activity: Not on file  Stress: Not on file  Social Connections: Unknown (06/12/2021)   Received from [facility]   Social Network    Social Network: Not on file     Family History: The patient's family history is not on file.  ROS:   Please see the history of present illness.     All other systems reviewed and are negative.  EKGs/Labs/Other Studies Reviewed:         Recent Labs: 01/11/2023: NT-Pro BNP 3,637; TSH 1.000 02/22/2023: Hemoglobin 13.0; Platelets 201 04/08/2023: ALT 10; BUN 8;  Creatinine, Ser 1.39; Potassium 4.0; Sodium 138   Recent Lipid Panel    Component Value Date/Time   CHOL 188 04/08/2023 0911   TRIG 83 04/08/2023 0911   HDL 42 04/08/2023 0911   CHOLHDL 4.5 (H) 04/08/2023 0911   CHOLHDL 3.8 11/18/2020 0154   VLDL 19 11/18/2020 0154   LDLCALC 131 (H) 04/08/2023 0911    Physical Exam:   VS:  BP (!) 200/90   Pulse 95   Ht 5' 5 (1.651 m)   Wt 217 lb (98.4 kg)   SpO2 98%   BMI 36.11 kg/m  , BMI Body mass index is 36.11 kg/m.  Vitals:   08/03/23 1045 08/03/23 1100  BP: (!) 227/95 Comment: 240/ 110 manual cuff (!) 200/90  Pulse: 95   Height: 5' 5 (1.651 m)   Weight: 217 lb (98.4 kg)   SpO2: 98%   BMI (Calculated): 36.11     GENERAL:  Well appearing, overweight HEENT: Pupils equal round and reactive, fundi not visualized, oral mucosa unremarkable NECK:  No jugular venous distention, waveform within normal limits, carotid upstroke brisk and symmetric, no bruits, no thyromegaly LYMPHATICS:  No cervical adenopathy LUNGS:  Clear to auscultation bilaterally HEART:  RRR.  PMI not displaced or sustained,S1 and S2 within normal limits, no S3, no S4, no clicks, no rubs, no murmurs ABD:  Flat, positive bowel sounds normal in frequency in pitch, no bruits, no rebound, no guarding, no midline pulsatile mass, no hepatomegaly, no splenomegaly EXT:  2 plus pulses throughout, no edema, no cyanosis no clubbing SKIN:  No rashes no nodules NEURO:  Cranial nerves II through XII grossly intact, motor grossly intact throughout PSYCH:  Cognitively intact, oriented to person place and time   ASSESSMENT/PLAN:    HTN - BP not at goal <130/80. BP markedly elevated today in setting of missing morning medications and self adjusting her antihypertensive therapy. She plans to take her antihypertensive agents as soon as she leaves clinic today.  Continue hydralazine  25 mg twice daily. Resume Amlodipine  at reduced dose 5mg  at bedtime (could uptitrate in future) Resume  Imdur  120mg  QAM. Resume Metoprolol  Succinate 25mg  at bedtime Continue Olmesartan  40mg  daily Discussed to monitor BP at home at least 2 hours after medications and sitting for 5-10 minutes.  Rx sent to [facility] for adherence packaging and delivery  Subvalvular membrane - Noted by TEE 02/2023. Continue to monitor.   Nonobstructive CAD/HLD, LDL goal less than 70 - Persistent chest pain out of proportion to CAD by [facility] 01/2023. Stop Ranexa , as cannot swallow tablet size. Resume Imdur  120mg  daily, Toprol  25mg  daily, Amlodipine  5mg  daily (anticipate this will improve chest pain, reduce frequency of PRN nitroglycerin ). Changes to BP regimen, as above. Continue Atorvastatin  80mg  daily.  Food insecurity -  multiple children at home during summer months. Bag of food provided. Social work consult placed.   Tobacco use - Smoking cessation encouraged. Recommend utilization of [id number].   Screening for Secondary Hypertension:     Relevant Labs/Studies:    Latest Ref Rng & Units 04/08/2023    9:11 AM 02/22/2023    8:11 AM 01/18/2023   10:47 AM  Basic Labs  Sodium 134 - 144 mmol/L 138  139  140   Potassium 3.5 - 5.2 mmol/L 4.0  4.0  3.4   Creatinine 0.57 - 1.00 mg/dL 8.60  8.61         Latest Ref Rng & Units 01/11/2023    8:17 AM  Thyroid   TSH 0.450 - 4.500 uIU/mL 1.000        Latest Ref Rng & Units 04/08/2023    9:11 AM  Renin/Aldosterone   Aldosterone 0.0 - 30.0 ng/dL 1.6   Aldos/Renin Ratio 0.0 - 30.0 2.2              Disposition:    FU with MD/APP/PharmD in 1-2 months    Medication Adjustments/Labs and Tests Ordered: Current medicines are reviewed at length with the patient today.  Concerns regarding medicines are outlined above.  Orders Placed This Encounter  Procedures   Referral to HRT/VAS Care Navigation   Meds ordered this encounter  Medications   nitroGLYCERIN  (NITROSTAT ) 0.4 MG SL tablet    Sig: Place 1 tablet (0.4 mg total) under the tongue every  5 (five) minutes as needed.    Dispense:  25 tablet    Refill:  5   metoprolol  succinate (TOPROL -XL) 25 MG 24 hr tablet    Sig: Take 1 tablet (25 mg total) by mouth at bedtime. Take with or immediately following a meal.    Dispense:  90 tablet    Refill:  1    For pill packs and delivery please    Supervising Provider:   LONNI SLAIN [[id number]]   isosorbide  mononitrate (IMDUR ) 120 MG 24 hr tablet    Sig: Take 1 tablet (120 mg total) by mouth in the morning.    Dispense:  90 tablet    Refill:  1    For pill packs and delivery please    Supervising Provider:   LONNI SLAIN [[id number]]   olmesartan  (BENICAR ) 40 MG tablet    Sig: Take 1 tablet (40 mg total) by mouth in the morning.    Dispense:  90 tablet    Refill:  1    For pill packs and delivery please    Supervising Provider:   LONNI SLAIN [[id number]]   atorvastatin  (LIPITOR ) 80 MG tablet    Sig: Take 1 tablet (80 mg total) by mouth at bedtime.    Dispense:  90 tablet    Refill:  3    For pill packs and delivery please    Supervising Provider:   CHRISTOPHER, BRIDGETTE [[id number]]   amLODipine  (NORVASC ) 5 MG tablet    Sig: Take 1 tablet (5 mg total) by mouth every evening.    Dispense:  90 tablet    Refill:  1    For pill packs and delivery please    Supervising Provider:   LONNI SLAIN [[id number]]   hydrALAZINE  (APRESOLINE ) 25 MG tablet    Sig: Take 1 tablet (25 mg total) by mouth in the morning and at bedtime.    Dispense:  180 tablet    Refill:  1    For pill pack  on delivery    Supervising Provider:   LONNI SLAIN [[id number]]     Signed, Reche GORMAN Finder, NP  08/07/2023 2:13 PM    [facility]

## 2023-08-04 ENCOUNTER — Telehealth: Payer: Self-pay | Admitting: Licensed Clinical Social Worker

## 2023-08-04 ENCOUNTER — Other Ambulatory Visit (HOSPITAL_COMMUNITY): Payer: Self-pay

## 2023-08-04 ENCOUNTER — Other Ambulatory Visit: Payer: Self-pay

## 2023-08-04 NOTE — Telephone Encounter (Signed)
 H&V Care Navigation CSW Progress Note  Clinical Social Worker contacted patient by phone to f/u on referral from [facility] as pt was assited with pantry items yesterday during visit. Called and reached pt today at [phone]. Pt answered, states she is on another important call, shared she is welcome to give me a call back at a time that works for her. Will re-attempt if no return call.   Patient is participating in a Managed Medicaid Plan:  No, [place] and [place]  SDOH Screenings   Food Insecurity: No Food Insecurity (07/29/2020)   Received from [facility]  Social Connections: Unknown (06/12/2021)   Received from [facility]  Tobacco Use: High Risk (05/30/2023)    Marit Lark, MSW, LCSW Clinical Social Worker II [facility] Heart/Vascular Care Navigation  [phone]- work cell phone (preferred)

## 2023-08-07 ENCOUNTER — Encounter (HOSPITAL_BASED_OUTPATIENT_CLINIC_OR_DEPARTMENT_OTHER): Payer: Self-pay | Admitting: Family

## 2023-08-08 ENCOUNTER — Other Ambulatory Visit: Payer: Self-pay

## 2023-08-08 ENCOUNTER — Other Ambulatory Visit (HOSPITAL_COMMUNITY): Payer: Self-pay

## 2023-08-09 ENCOUNTER — Other Ambulatory Visit: Payer: Self-pay

## 2023-08-10 ENCOUNTER — Telehealth: Payer: Self-pay | Admitting: Licensed Clinical Social Worker

## 2023-08-10 ENCOUNTER — Other Ambulatory Visit: Payer: Self-pay

## 2023-08-10 ENCOUNTER — Other Ambulatory Visit (HOSPITAL_BASED_OUTPATIENT_CLINIC_OR_DEPARTMENT_OTHER): Payer: Self-pay

## 2023-08-10 ENCOUNTER — Other Ambulatory Visit (HOSPITAL_COMMUNITY): Payer: Self-pay

## 2023-08-10 ENCOUNTER — Other Ambulatory Visit: Payer: Self-pay | Admitting: Pharmacist

## 2023-08-10 DIAGNOSIS — Z79899 Other long term (current) drug therapy: Secondary | ICD-10-CM

## 2023-08-10 DIAGNOSIS — I10 Essential (primary) hypertension: Secondary | ICD-10-CM

## 2023-08-10 MED ORDER — ISOSORBIDE MONONITRATE ER 120 MG PO TB24
120.0000 mg | ORAL_TABLET | Freq: Every morning | ORAL | 0 refills | Status: DC
  Filled 2023-08-10 – 2023-08-30 (×3): qty 5, 5d supply, fill #0

## 2023-08-10 MED ORDER — AMLODIPINE BESYLATE 5 MG PO TABS
5.0000 mg | ORAL_TABLET | Freq: Every evening | ORAL | 0 refills | Status: DC
  Filled 2023-08-10 – 2023-08-30 (×3): qty 5, 5d supply, fill #0

## 2023-08-10 MED ORDER — METOPROLOL SUCCINATE ER 25 MG PO TB24
25.0000 mg | ORAL_TABLET | Freq: Every day | ORAL | 0 refills | Status: DC
Start: 2023-08-10 — End: 2023-08-30
  Filled 2023-08-10 – 2023-08-30 (×3): qty 5, 5d supply, fill #0

## 2023-08-10 MED ORDER — ATORVASTATIN CALCIUM 80 MG PO TABS
80.0000 mg | ORAL_TABLET | Freq: Every day | ORAL | 0 refills | Status: DC
  Filled 2023-08-10 – 2023-08-30 (×3): qty 5, 5d supply, fill #0

## 2023-08-10 NOTE — Telephone Encounter (Signed)
 Emergency supply of medications sent to [facility] while patient awaits for pharmacy delivery

## 2023-08-10 NOTE — Progress Notes (Signed)
 Heart and Vascular Care Navigation  08/10/2023  Jodi Porter 12-22-1975 [id number]  Reason for Referral: SDOH Screening Patient is participating in a Managed Medicaid Plan:Yes  Engaged with patient by telephone for initial visit for Heart and Vascular Care Coordination.                                                                                                   Assessment:    LCSW was able to reach pt today at [phone]. Introduced self, role, reason for call. Pt confirmed home address, resides with her children and spouse. She currently is not working, her spouse is still employed. Pt denies any issues with housing or utility costs, or transportation. She was assisted with food bag from pantry at [facility], they unfortunately have a household income that does not qualify them for assistance. We discussed food resources available from clinic that we can send her and how to use [place] [employer]inder app as needed to supplement food.   Pt did share that she had been ordered medications for adherance packaging and delivery on 7/2 and those still are still pending delivery. She is down to her last dose for medications and doesn't know what to do for her next steps. I will reach out to pharmacy/[facility] pharmacy delivery regarding next steps.                                  HRT/VAS Care Coordination     Patients Home Cardiology Office --  [facility]   Outpatient Care Team Social Worker   Social Worker Name: Marit Lark, [place], [phone]   Living arrangements for the past 2 months Single Family Home   Lives with: Spouse; Minor Children   Patient Current Insurance Coverage [place]; Medicaid   Patient Has Concern With Paying Medical Bills No   Does Patient Have Prescription Coverage? Yes   Home Assistive Devices/Equipment None       Social History:                                                                             SDOH Screenings   Food Insecurity: Food Insecurity Present (08/10/2023)   Housing: Low Risk  (08/10/2023)  Transportation Needs: No Transportation Needs (08/10/2023)  Utilities: Not At Risk (08/10/2023)  Financial Resource Strain: Low Risk  (08/10/2023)  Social Connections: Unknown (06/12/2021)   Received from [facility]  Tobacco Use: High Risk (08/07/2023)  Health Literacy: Adequate Health Literacy (08/10/2023)    SDOH Interventions: Financial Resources:  Surveyor, quantity Strain Interventions: [employer] Provided (food resources provided)  Food Insecurity:  Food Insecurity Interventions: Programmer, applications Provided- Environmental manager  Housing Insecurity:  Housing Interventions: Intervention Not Indicated  Transportation:   Transportation Interventions: Intervention  Not Indicated   Other Care Navigation Interventions:     Provided Pharmacy assistance resources  Pt having issues with getting her medications delivered, I offered to f/u with pharmacy to see what we could do. Has one dose left of meds.   Follow-up plan:   LCSW has mailed pt my card and food resources packet for [employer]. I have called [facility] and adherance packaging team member Dylan [phone]). Also requested assistance from pharmacy tech team at [facility] to trouble shoot next steps. Our pharmacy team can send in small refill 1x to [facility] for pt, she is able to pick those up. Had questions about billing, will defer to pharmacy for assistance given delay.

## 2023-08-11 ENCOUNTER — Other Ambulatory Visit: Payer: Self-pay

## 2023-08-11 ENCOUNTER — Other Ambulatory Visit (HOSPITAL_COMMUNITY): Payer: Self-pay

## 2023-08-12 ENCOUNTER — Other Ambulatory Visit (HOSPITAL_COMMUNITY): Payer: Self-pay

## 2023-08-16 ENCOUNTER — Encounter (HOSPITAL_BASED_OUTPATIENT_CLINIC_OR_DEPARTMENT_OTHER): Admitting: Family

## 2023-08-16 ENCOUNTER — Telehealth (HOSPITAL_BASED_OUTPATIENT_CLINIC_OR_DEPARTMENT_OTHER): Payer: Self-pay | Admitting: Family

## 2023-08-16 NOTE — Telephone Encounter (Signed)
 Jodi Porter was scheduled for clinic visit today but had to cancel due to a family member borrowing her car and not returning in a timely manner.   Her blood pressure today 110/57. She recently got her medications as there was a hiccup with the insurance. She appreciates the pill packs. She does feel a bit weaker when her blood pressure is lower. She reports she feels more weak about an hour after taking her morning medications. She has had some hypotensive spells. Of note, she has been taking medications in her pill pack as well as previous bottles as she thought she was erroneously not given enough. Discussed that we had reduced dose/frequency of her hypertension regimen to prevent hypotension. She will take just what is in her pill pack.   Rescheduled follow up for 08/25/23 at 1pm with Jodi GORMAN Finder, NP in [facility]   Jodi GORMAN Finder, NP

## 2023-08-24 ENCOUNTER — Other Ambulatory Visit: Payer: Self-pay

## 2023-08-24 ENCOUNTER — Encounter (HOSPITAL_BASED_OUTPATIENT_CLINIC_OR_DEPARTMENT_OTHER): Payer: Self-pay

## 2023-08-25 ENCOUNTER — Other Ambulatory Visit: Payer: Self-pay

## 2023-08-25 ENCOUNTER — Encounter (HOSPITAL_BASED_OUTPATIENT_CLINIC_OR_DEPARTMENT_OTHER): Admitting: Family

## 2023-08-30 ENCOUNTER — Other Ambulatory Visit (HOSPITAL_COMMUNITY): Payer: Self-pay

## 2023-08-30 ENCOUNTER — Other Ambulatory Visit (HOSPITAL_BASED_OUTPATIENT_CLINIC_OR_DEPARTMENT_OTHER): Payer: Self-pay

## 2023-08-30 ENCOUNTER — Telehealth: Payer: Self-pay | Admitting: Family

## 2023-08-30 ENCOUNTER — Other Ambulatory Visit: Payer: Self-pay

## 2023-08-30 DIAGNOSIS — Z79899 Other long term (current) drug therapy: Secondary | ICD-10-CM

## 2023-08-30 DIAGNOSIS — I10 Essential (primary) hypertension: Secondary | ICD-10-CM

## 2023-08-30 MED ORDER — ISOSORBIDE MONONITRATE ER 120 MG PO TB24
120.0000 mg | ORAL_TABLET | Freq: Every morning | ORAL | 5 refills | Status: DC
  Filled 2023-08-30: qty 30, 30d supply, fill #0
  Filled 2023-09-27: qty 30, 30d supply, fill #1
  Filled 2023-10-27: qty 30, 30d supply, fill #2
  Filled 2023-11-24: qty 30, 30d supply, fill #3
  Filled 2023-12-19: qty 30, 30d supply, fill #4
  Filled 2024-01-30: qty 30, 30d supply, fill #5

## 2023-08-30 MED ORDER — HYDRALAZINE HCL 25 MG PO TABS
25.0000 mg | ORAL_TABLET | Freq: Two times a day (BID) | ORAL | 5 refills | Status: DC
  Filled 2023-08-30: qty 60, 30d supply, fill #0
  Filled 2023-09-27: qty 60, 30d supply, fill #1
  Filled 2023-10-27: qty 60, 30d supply, fill #2

## 2023-08-30 MED ORDER — ISOSORBIDE MONONITRATE ER 120 MG PO TB24
120.0000 mg | ORAL_TABLET | Freq: Every morning | ORAL | 5 refills | Status: DC
  Filled 2023-08-30: qty 30, 30d supply, fill #0

## 2023-08-30 MED ORDER — METOPROLOL SUCCINATE ER 25 MG PO TB24
25.0000 mg | ORAL_TABLET | Freq: Every day | ORAL | 5 refills | Status: DC
  Filled 2023-08-30: qty 30, 30d supply, fill #0
  Filled 2023-09-27: qty 30, 30d supply, fill #1
  Filled 2023-10-27: qty 30, 30d supply, fill #2
  Filled 2023-11-24: qty 30, 30d supply, fill #3
  Filled 2023-12-19: qty 30, 30d supply, fill #4
  Filled 2024-01-30: qty 30, 30d supply, fill #5

## 2023-08-30 MED ORDER — OLMESARTAN MEDOXOMIL 40 MG PO TABS
40.0000 mg | ORAL_TABLET | Freq: Every morning | ORAL | 5 refills | Status: DC
  Filled 2023-08-30: qty 30, 30d supply, fill #0

## 2023-08-30 MED ORDER — AMLODIPINE BESYLATE 5 MG PO TABS
5.0000 mg | ORAL_TABLET | Freq: Every day | ORAL | 5 refills | Status: DC
Start: 2023-08-30 — End: 2023-08-30
  Filled 2023-08-30: qty 30, 30d supply, fill #0

## 2023-08-30 MED ORDER — OLMESARTAN MEDOXOMIL 40 MG PO TABS
40.0000 mg | ORAL_TABLET | Freq: Every morning | ORAL | 5 refills | Status: DC
  Filled 2023-08-30: qty 30, 30d supply, fill #0
  Filled 2023-09-27: qty 30, 30d supply, fill #1
  Filled 2023-10-27: qty 30, 30d supply, fill #2
  Filled 2023-11-24: qty 30, 30d supply, fill #3
  Filled 2023-12-19: qty 30, 30d supply, fill #4
  Filled 2024-01-30: qty 30, 30d supply, fill #5

## 2023-08-30 MED ORDER — HYDRALAZINE HCL 25 MG PO TABS
25.0000 mg | ORAL_TABLET | Freq: Two times a day (BID) | ORAL | 5 refills | Status: DC
  Filled 2023-08-30: qty 30, 15d supply, fill #0

## 2023-08-30 MED ORDER — ATORVASTATIN CALCIUM 80 MG PO TABS
80.0000 mg | ORAL_TABLET | Freq: Every day | ORAL | 5 refills | Status: DC
  Filled 2023-08-30: qty 30, 30d supply, fill #0
  Filled 2023-09-27: qty 30, 30d supply, fill #1
  Filled 2023-10-27: qty 30, 30d supply, fill #2
  Filled 2023-11-24: qty 30, 30d supply, fill #3
  Filled 2023-12-19: qty 30, 30d supply, fill #4
  Filled 2024-01-30: qty 30, 30d supply, fill #5

## 2023-08-30 MED ORDER — AMLODIPINE BESYLATE 5 MG PO TABS
5.0000 mg | ORAL_TABLET | Freq: Every day | ORAL | 5 refills | Status: DC
  Filled 2023-08-30: qty 30, 30d supply, fill #0
  Filled 2023-09-27: qty 30, 30d supply, fill #1
  Filled 2023-10-27: qty 30, 30d supply, fill #2
  Filled 2023-11-24: qty 30, 30d supply, fill #3
  Filled 2023-12-19: qty 30, 30d supply, fill #4
  Filled 2024-01-30: qty 30, 30d supply, fill #5

## 2023-08-30 MED ORDER — METOPROLOL SUCCINATE ER 25 MG PO TB24
25.0000 mg | ORAL_TABLET | Freq: Every day | ORAL | 0 refills | Status: DC
  Filled 2023-08-30: qty 5, 5d supply, fill #0

## 2023-08-30 NOTE — Addendum Note (Signed)
 Addended by: Nadira Single S on: 08/30/2023 01:38 PM   Modules accepted: Orders

## 2023-08-30 NOTE — Telephone Encounter (Signed)
 Pharmacy is wanting to know if we can change these medications from a 5 day supply to a 30 day because they are doing her Pill packs. Please advise  amLODipine  (NORVASC ) 5 MG tablet   atorvastatin  (LIPITOR ) 80 MG tablet    isosorbide  mononitrate (IMDUR ) 120 MG 24 hr tablet   metoprolol  succinate (TOPROL -XL) 25 MG 24 hr tablet

## 2023-08-30 NOTE — Telephone Encounter (Signed)
 Called to make pharmacy aware 30 day supply permissible for pill packs.   Nadra Hritz S Mannie Wineland, NP

## 2023-08-31 ENCOUNTER — Other Ambulatory Visit: Payer: Self-pay

## 2023-09-01 ENCOUNTER — Other Ambulatory Visit: Payer: Self-pay

## 2023-09-09 ENCOUNTER — Encounter (HOSPITAL_BASED_OUTPATIENT_CLINIC_OR_DEPARTMENT_OTHER): Payer: Self-pay | Admitting: Family

## 2023-09-09 ENCOUNTER — Ambulatory Visit (HOSPITAL_BASED_OUTPATIENT_CLINIC_OR_DEPARTMENT_OTHER): Admitting: Family

## 2023-09-09 ENCOUNTER — Encounter (HOSPITAL_BASED_OUTPATIENT_CLINIC_OR_DEPARTMENT_OTHER): Payer: Self-pay

## 2023-09-09 VITALS — BP 162/70 | HR 58 | Ht 65.0 in | Wt 211.0 lb

## 2023-09-09 DIAGNOSIS — I25118 Atherosclerotic heart disease of native coronary artery with other forms of angina pectoris: Secondary | ICD-10-CM | POA: Diagnosis not present

## 2023-09-09 DIAGNOSIS — I10 Essential (primary) hypertension: Secondary | ICD-10-CM

## 2023-09-09 DIAGNOSIS — E785 Hyperlipidemia, unspecified: Secondary | ICD-10-CM

## 2023-09-09 NOTE — Patient Instructions (Addendum)
 Medication Instructions:  Continue your current medications.  Only take what is in the new pill pack.    Follow-Up: In September as scheduled   Special Instructions:    You have been referred to cardiac rehab called [facility]: [phone]

## 2023-09-09 NOTE — Progress Notes (Signed)
 Advanced Hypertension Clinic Assessment:    Date:  09/09/2023   ID:  Jodi Porter, DOB 1975/11/02, MRN [id number]  PCP:  [name], MD  Cardiologist:  Annabella Scarce, MD  Nephrologist:  Referring MD: [name], MD   CC: Hypertension  History of Present Illness:    Jodi Porter is a 48 y.o. female with a hx of CAD s/p STEMI (10/2020 nonobstructive disease 50-60% RCA stenosis ruptured plaque with thrombus resolution vs spasm managed medically  [facility] 01/2023 nonbstructive disease in LAD, LCx, mild to moderate disease in RCA), DM2, HTN, HLD, OSA, subvalvular membrane,  obesity here to follow up in the Advanced Hypertension Clinic.   Echo 01/2023 LVEF 60-65%, gr2dd, moderate to severe AI with gradient of . TEE 02/2023 subvalvular membrane with mean gradient 28 mmHg.  Due to BP persistently greater than 200 HCTZ and losartan  discontinued.  Chlorthalidone . olmesartan  started.  Hydralazine  increased.  Established with [facility] Dr. Scarce 04/08/23. Previous hypertension with BP up to 200 mm recurrent then with episode of hypotension 90/40. She noted persistent chest pain, exertional dyspnea, nocturia.  Hydralazine  reduced to BID, chlorthalidone  reduced to half tablet.  Amlodipine , and dry, metoprolol , olmesartan  continued.  She was recommended to start Ranexa  500 mg twice daily for chest pain.  Labs 04/08/23 with no hyperaldosteronism.   Seen 08/03/23 with persistent chest pain when laying down which improved with sitting up. BP at home 135-150. She had not reduced Hydralazine  as previously recommended and had only taken one tablet of Ranexa . Medication schedule adjusted so as to not take all her antihypertensives at once and Ranexa  resumed. She was set up for pill packs through [facility] pharmacy.   Presents today for follow up. She is tearful during portions of our exam with frustration with her inability to work, previously enjoyed working as CNA/phlebotomist.  She is presently pursuing disability with a lawyer's assistance. Attributes losing her job to being on light duty after her STEMI 10/2020. She reports persistent exertional dyspnea with minimal activity such as walking her home and needing her PRN nitroglycerin  multiple times per week though chest pain resolves with one tablet. Just recently got new pill packs that have all of her cardiac medications after previously having had to do a mix of pill packs and medications from prior pill bottles due to insurance reasons. Check BP intermittently home, reports elevated but no specific readings brought to clinic today. She did have two days where she self discontinued her medication to see if she felt better as had concerns about becoming dependent on medication. We discussed that hypertension requires consistent medication use.   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    Past Surgical History:  Procedure Laterality Date   CORONARY PRESSURE/FFR STUDY N/A 11/18/2020   Procedure: INTRAVASCULAR PRESSURE WIRE/FFR STUDY;  Surgeon: Claudene Victory ORN, MD;  Location: [facility] INVASIVE CV LAB;  Service: Cardiovascular;  Laterality: N/A;   CORONARY/GRAFT ACUTE MI REVASCULARIZATION N/A 10/26/2020   Procedure: Coronary/Graft Acute MI Revascularization;  Surgeon: Claudene Victory ORN, MD;  Location: [facility] INVASIVE CV LAB;  Service: Cardiovascular;  Laterality: N/A;   ECTOPIC  PREGNANCY SURGERY     LEFT HEART CATH AND CORONARY ANGIOGRAPHY N/A 10/26/2020   Procedure: LEFT HEART CATH AND CORONARY ANGIOGRAPHY;  Surgeon: Claudene Victory ORN, MD;   Location: [facility] INVASIVE CV LAB;  Service: Cardiovascular;  Laterality: N/A;   LEFT HEART CATH AND CORONARY ANGIOGRAPHY N/A 11/18/2020   Procedure: LEFT HEART CATH AND CORONARY ANGIOGRAPHY;  Surgeon: Claudene Victory ORN, MD;  Location: [facility] INVASIVE CV LAB;  Service: Cardiovascular;  Laterality: N/A;   RIGHT/LEFT HEART CATH AND CORONARY ANGIOGRAPHY N/A 01/18/2023   Procedure: RIGHT/LEFT HEART CATH AND CORONARY ANGIOGRAPHY;  Surgeon: Wonda Sharper, MD;  Location: [facility] INVASIVE CV LAB;  Service: Cardiovascular;  Laterality: N/A;   TRANSESOPHAGEAL ECHOCARDIOGRAM (CATH LAB) N/A 02/24/2023   Procedure: TRANSESOPHAGEAL ECHOCARDIOGRAM;  Surgeon: Raford Riggs, MD;  Location: [facility] INVASIVE CV LAB;  Service: Cardiovascular;  Laterality: N/A;    Current Medications: Current Meds  Medication Sig   amLODipine  (NORVASC ) 5 MG tablet Take 1 tablet (5 mg total) by mouth at bedtime.   atorvastatin  (LIPITOR ) 80 MG tablet Take 1 tablet (80 mg total) by mouth at bedtime.   hydrALAZINE  (APRESOLINE ) 25 MG tablet Take 1 tablet (25 mg total) by mouth in the morning and at bedtime.   isosorbide  mononitrate (IMDUR ) 120 MG 24 hr tablet Take 1 tablet (120 mg total) by mouth in the morning.   levonorgestrel (MIRENA, 52 MG,) 20 MCG/DAY IUD Mirena 21 mcg/24 hours (8 yrs) 52 mg intrauterine device  to be inserted by provider for one dose   metoprolol  succinate (TOPROL -XL) 25 MG 24 hr tablet Take 1 tablet (25 mg total) by mouth at bedtime. Take with or immediately following a meal.   nitroGLYCERIN  (NITROSTAT ) 0.4 MG SL tablet Place 1 tablet (0.4 mg total) under the tongue every 5 (five) minutes as needed.   olmesartan  (BENICAR ) 40 MG tablet Take 1 tablet (40 mg total) by mouth in the morning.     Allergies:   Penicillins and Tramadol   Social History   Socioeconomic History   Marital status: Single    Spouse name: Not on file   Number of children: Not on file   Years of education: Not on file   Highest education level: Not  on file  Occupational History   Not on file  Tobacco Use   Smoking status: Former    Current packs/day: 0.50    Types: Cigarettes   Smokeless tobacco: Never  Substance and Sexual Activity   Alcohol use: No   Drug use: No   Sexual activity: Yes    Birth control/protection: I.U.D.  Other Topics Concern   Not on file  Social History Narrative   Not on file   Social Drivers of Health   Financial Resource Strain: Low Risk  (08/10/2023)   Overall Financial Resource Strain (CARDIA)    Difficulty of Paying Living Expenses: Not very hard  Food Insecurity: Food Insecurity Present (08/10/2023)   Hunger Vital Sign    Worried About Running Out of Food in the Last Year: Sometimes true    Ran Out of Food in the Last Year: Never true  Transportation Needs: No Transportation Needs (08/10/2023)   PRAPARE - Administrator, Civil Service (Medical): No    Lack of Transportation (Non-Medical): No  Physical Activity: Not on file  Stress: Not on file  Social Connections: Unknown (06/12/2021)   Received from [facility]   Social Network    Social Network: Not on file     Family History:  The patient's family history is not on file.  ROS:   Please see the history of present illness.     All other systems reviewed and are negative.  EKGs/Labs/Other Studies Reviewed:         Recent Labs: 01/11/2023: NT-Pro BNP 3,637; TSH 1.000 02/22/2023: Hemoglobin 13.0; Platelets 201 04/08/2023: ALT 10; BUN 8; Creatinine, Ser 1.39; Potassium 4.0; Sodium 138   Recent Lipid Panel    Component Value Date/Time   CHOL 188 04/08/2023 0911   TRIG 83 04/08/2023 0911   HDL 42 04/08/2023 0911   CHOLHDL 4.5 (H) 04/08/2023 0911   CHOLHDL 3.8 11/18/2020 0154   VLDL 19 11/18/2020 0154   LDLCALC 131 (H) 04/08/2023 0911    Physical Exam:   VS:  BP (!) 162/70   Pulse (!) 58   Ht 5' 5 (1.651 m)   Wt 211 lb (95.7 kg)   SpO2 99%   BMI 35.11 kg/m  , BMI Body mass index is 35.11 kg/m.  Vitals:    09/09/23 1315 09/09/23 1342  BP: (!) 170/76 (!) 162/70  Pulse: (!) 58   Height: 5' 5 (1.651 m)   Weight: 211 lb (95.7 kg)   SpO2: 99%   BMI (Calculated): 35.11      GENERAL:  Well appearing, overweight HEENT: Pupils equal round and reactive, fundi not visualized, oral mucosa unremarkable NECK:  No jugular venous distention, waveform within normal limits, carotid upstroke brisk and symmetric, no bruits, no thyromegaly LYMPHATICS:  No cervical adenopathy LUNGS:  Clear to auscultation bilaterally HEART:  RRR.  PMI not displaced or sustained,S1 and S2 within normal limits, no S3, no S4, no clicks, no rubs, no murmurs ABD:  Flat, positive bowel sounds normal in frequency in pitch, no bruits, no rebound, no guarding, no midline pulsatile mass, no hepatomegaly, no splenomegaly EXT:  2 plus pulses throughout, no edema, no cyanosis no clubbing SKIN:  No rashes no nodules NEURO:  Cranial nerves II through XII grossly intact, motor grossly intact throughout PSYCH:  Cognitively intact, oriented to person place and time   ASSESSMENT/PLAN:    HTN - BP not at goal <130/80. BP previously markedly elevated today in setting of missing morning medications and self adjusting her antihypertensive therapy. We again discussed that treatment of hypertension requires consistent utilization of medications. Her antihypertensives are now all in her pill pack which she is starting tomorrow after receiving two days ago.  Pill pack contains: Continue hydralazine  25 mg twice daily. Avoid TID dosing for easier adherence. Continue Amlodipine  5mg  at bedtime (could uptitrate in future) Continue Imdur  120mg  QAM. 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 Continue to monitor. Treatment of hypertension, as  above.   Nonobstructive CAD/HLD, LDL goal less than 70 - Persistent chest pain out of proportion to CAD by [facility] 01/2023. Stop Ranexa , as cannot swallow tablet size. Continue Imdur  120mg  daily, Toprol  25mg  daily, Amlodipine  5mg  daily (anticipate this will improve chest pain, reduce frequency of PRN nitroglycerin ). Changes to BP regimen, as above. Continue Atorvastatin  80mg  daily. Refer to cardiac rehab for chronic angina and increase in physical stamina.   Tobacco use - Smoking cessation encouraged. Recommend utilization of [id number].   Screening for Secondary Hypertension:     Relevant Labs/Studies:    Latest Ref Rng & Units 04/08/2023  9:11 AM 02/22/2023    8:11 AM 01/18/2023   10:47 AM  Basic Labs  Sodium 134 - 144 mmol/L 138  139  140   Potassium 3.5 - 5.2 mmol/L 4.0  4.0  3.4   Creatinine 0.57 - 1.00 mg/dL 8.60  8.61         Latest Ref Rng & Units 01/11/2023    8:17 AM  Thyroid   TSH 0.450 - 4.500 uIU/mL 1.000        Latest Ref Rng & Units 04/08/2023    9:11 AM  Renin/Aldosterone   Aldosterone 0.0 - 30.0 ng/dL 1.6   Aldos/Renin Ratio 0.0 - 30.0 2.2              Disposition:    FU with MD/APP/PharmD in 1 months    Medication Adjustments/Labs and Tests Ordered: Current medicines are reviewed at length with the patient today.  Concerns regarding medicines are outlined above.  No orders of the defined types were placed in this encounter.  No orders of the defined types were placed in this encounter.    Signed, Reche GORMAN Finder, NP  09/09/2023 1:42 PM     [facility]

## 2023-09-12 ENCOUNTER — Telehealth (HOSPITAL_COMMUNITY): Payer: Self-pay

## 2023-09-12 NOTE — Telephone Encounter (Signed)
 Pt wants to attend cardiac rehab at [facility]. Will fax referral to [facility].   Cancel referral

## 2023-09-21 ENCOUNTER — Other Ambulatory Visit: Payer: Self-pay

## 2023-09-23 ENCOUNTER — Other Ambulatory Visit (HOSPITAL_COMMUNITY): Payer: Self-pay

## 2023-09-27 ENCOUNTER — Other Ambulatory Visit: Payer: Self-pay

## 2023-09-28 ENCOUNTER — Other Ambulatory Visit: Payer: Self-pay

## 2023-10-12 ENCOUNTER — Other Ambulatory Visit (HOSPITAL_COMMUNITY): Payer: Self-pay

## 2023-10-12 ENCOUNTER — Other Ambulatory Visit: Payer: Self-pay

## 2023-10-17 ENCOUNTER — Other Ambulatory Visit: Payer: Self-pay

## 2023-10-27 ENCOUNTER — Other Ambulatory Visit (HOSPITAL_BASED_OUTPATIENT_CLINIC_OR_DEPARTMENT_OTHER): Payer: Self-pay

## 2023-10-27 ENCOUNTER — Other Ambulatory Visit (HOSPITAL_COMMUNITY): Payer: Self-pay

## 2023-10-27 ENCOUNTER — Ambulatory Visit (INDEPENDENT_AMBULATORY_CARE_PROVIDER_SITE_OTHER): Admitting: Family

## 2023-10-27 ENCOUNTER — Other Ambulatory Visit: Payer: Self-pay

## 2023-10-27 ENCOUNTER — Encounter (HOSPITAL_BASED_OUTPATIENT_CLINIC_OR_DEPARTMENT_OTHER): Payer: Self-pay | Admitting: Family

## 2023-10-27 VITALS — BP 106/70 | HR 67 | Resp 17 | Ht 65.0 in | Wt 207.0 lb

## 2023-10-27 DIAGNOSIS — E785 Hyperlipidemia, unspecified: Secondary | ICD-10-CM | POA: Diagnosis not present

## 2023-10-27 DIAGNOSIS — I10 Essential (primary) hypertension: Secondary | ICD-10-CM

## 2023-10-27 DIAGNOSIS — R42 Dizziness and giddiness: Secondary | ICD-10-CM | POA: Diagnosis not present

## 2023-10-27 MED ORDER — EZETIMIBE 10 MG PO TABS
10.0000 mg | ORAL_TABLET | Freq: Every day | ORAL | 3 refills | Status: AC
  Filled 2023-10-27: qty 30, 30d supply, fill #0
  Filled 2023-10-27: qty 90, 90d supply, fill #0
  Filled 2023-10-28: qty 30, 30d supply, fill #0
  Filled 2023-11-24: qty 30, 30d supply, fill #1
  Filled 2023-12-19: qty 30, 30d supply, fill #2
  Filled 2024-01-30: qty 30, 30d supply, fill #3
  Filled 2024-02-29 – 2024-03-01 (×2): qty 30, 30d supply, fill #4

## 2023-10-27 NOTE — Patient Instructions (Addendum)
 Medication Instructions:  STOP Hydralazine   START Zetia  10mg  daily    Follow-Up: Please follow up in 3 months in ADV HTN CLINIC with Dr. Raford   Special Instructions:   If you get sensation of dizziness with spinning, recommend taking over the counter Meclizine 25mg .  Bring both of your blood pressure cuffs with you to your next office visit.   Recommend call cardiac rehab at [facility]: [phone]

## 2023-10-27 NOTE — Progress Notes (Signed)
 Advanced Hypertension Clinic Assessment:    Date:  10/27/2023   ID:  Jodi Porter, DOB [birth year], MRN [id number]  PCP:  [name], MD  Cardiologist:  Annabella Scarce, MD  Nephrologist:  Referring MD: [name], MD   CC: Hypertension  History of Present Illness:    Jodi Porter is a 48 y.o. female with a hx of CAD s/p STEMI (10/2020 nonobstructive disease 50-60% RCA stenosis ruptured plaque with thrombus resolution vs spasm managed medically  [facility] 01/2023 nonbstructive disease in LAD, LCx, mild to moderate disease in RCA), DM2, HTN, HLD, OSA, subvalvular membrane,  obesity here to follow up in the Advanced Hypertension Clinic.   Echo 01/2023 LVEF 60-65%, gr2dd, moderate to severe AI with gradient of . TEE 02/2023 subvalvular membrane with mean gradient 28 mmHg.  Due to BP persistently greater than 200 HCTZ and losartan  discontinued.  Chlorthalidone . olmesartan  started.  Hydralazine  increased.  Established with [facility] Dr. Scarce 04/08/23. Previous hypertension with BP up to 200 mm recurrent then with episode of hypotension 90/40. She noted persistent chest pain, exertional dyspnea, nocturia.  Hydralazine  reduced to BID, chlorthalidone  reduced to half tablet.  Amlodipine , and dry, metoprolol , olmesartan  continued.  She was recommended to start Ranexa  500 mg twice daily for chest pain.  Labs 04/08/23 with no hyperaldosteronism.   Seen 08/03/23 with persistent chest pain when laying down which improved with sitting up. BP at home 135-150. She had not reduced Hydralazine  as previously recommended and had only taken one tablet of Ranexa . Medication schedule adjusted so as to not take all her antihypertensives at once and Ranexa  resumed. She was set up for pill packs through [facility] pharmacy.   She was last seen 09/09/23 tearful during portions of our exam with frustration with her inability to work, previously enjoyed working as CNA/phlebotomist. She was  pursuing disability with a lawyer's assistance. Attributes losing her job to being on light duty after her STEMI 10/2020. She had just received new pill packs and was advised to take only what was in her pill packs, not self-adjust medications.   Evaluated 10/17/23 at [facility] for dizziness with stroke workup unremarkable. Diagnosed with TIA. Aspirin  plavix , atorvastatin  as taken prior to admission were resumed. Echo LVEF 60-65%, no rWMA. Dizziness responded to meclizine. SBP spike to 220s during admission in setting of holding BP meds, improved to 150s on resuming her antihypertensive regimen.   Presents today for follow up with her significant other. Reports she is getting a numbness in her arm which she attributes to not taking her Isosorbide . Notes prior to her ED visit she had inadvertently ingested edibles as family members had put it out at a family gathering but she was not aware. Her BP today is relatively hypotensive. Her weight today is 4 lbs down from clinic visit a few months ago. Notes her blood pressure has been staying low. She did not bring specific readings. Reports her wrist cuff is giving her low readings whereas her arm cuff will give her elevated readings. Describes her lightheaded as feeling as if she needs to sit down. Reports her eyesight is just not right since leaving the ED. Labs while admitted 10/17/23 with LDL 102.   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    Past Surgical History:  Procedure Laterality Date   CORONARY PRESSURE/FFR  STUDY N/A 11/18/2020   Procedure: INTRAVASCULAR PRESSURE WIRE/FFR STUDY;  Surgeon: Claudene Victory ORN, MD;  Location: [facility] INVASIVE CV LAB;  Service: Cardiovascular;  Laterality: N/A;   CORONARY/GRAFT ACUTE MI REVASCULARIZATION N/A 10/26/2020   Procedure:  Coronary/Graft Acute MI Revascularization;  Surgeon: Claudene Victory ORN, MD;  Location: [facility] INVASIVE CV LAB;  Service: Cardiovascular;  Laterality: N/A;   ECTOPIC PREGNANCY SURGERY     LEFT HEART CATH AND CORONARY ANGIOGRAPHY N/A 10/26/2020   Procedure: LEFT HEART CATH AND CORONARY ANGIOGRAPHY;  Surgeon: Claudene Victory ORN, MD;  Location: [facility] INVASIVE CV LAB;  Service: Cardiovascular;  Laterality: N/A;   LEFT HEART CATH AND CORONARY ANGIOGRAPHY N/A 11/18/2020   Procedure: LEFT HEART CATH AND CORONARY ANGIOGRAPHY;  Surgeon: Claudene Victory ORN, MD;  Location: [facility] INVASIVE CV LAB;  Service: Cardiovascular;  Laterality: N/A;   RIGHT/LEFT HEART CATH AND CORONARY ANGIOGRAPHY N/A 01/18/2023   Procedure: RIGHT/LEFT HEART CATH AND CORONARY ANGIOGRAPHY;  Surgeon: Wonda Sharper, MD;  Location: [facility] INVASIVE CV LAB;  Service: Cardiovascular;  Laterality: N/A;   TRANSESOPHAGEAL ECHOCARDIOGRAM (CATH LAB) N/A 02/24/2023   Procedure: TRANSESOPHAGEAL ECHOCARDIOGRAM;  Surgeon: Raford Riggs, MD;  Location: [facility] INVASIVE CV LAB;  Service: Cardiovascular;  Laterality: N/A;    Current Medications: Current Meds  Medication Sig   ezetimibe  (ZETIA ) 10 MG tablet Take 1 tablet (10 mg total) by mouth daily.     Allergies:   Penicillins and Tramadol   Social History   Socioeconomic History   Marital status: Single    Spouse name: Not on file   Number of children: 4   Years of education: Not on file   Highest education level: Not on file  Occupational History   Not on file  Tobacco Use   Smoking status: Former    Current packs/day: 0.50    Average packs/day: 0.5 packs/day for 0.2 years (0.1 ttl pk-yrs)    Types: Cigarettes    Start date: 08/2023   Smokeless tobacco: Never  Vaping Use   Vaping status: Never Used  Substance and Sexual Activity   Alcohol use: No   Drug use: No   Sexual activity: Yes    Birth control/protection: I.U.D.  Other Topics Concern   Not on file  Social History Narrative   Not on file    Social Drivers of Health   Financial Resource Strain: Low Risk  (08/10/2023)   Overall Financial Resource Strain (CARDIA)    Difficulty of Paying Living Expenses: Not very hard  Food Insecurity: No Food Insecurity (10/18/2023)   Received from [facility]   Hunger Vital Sign    Within the past 12 months, you worried that your food would run out before you got the money to buy more.: Never true    Within the past 12 months, the food you bought just didn't last and you didn't have money to get more.: Never true  Recent Concern: Food Insecurity - Food Insecurity Present (08/10/2023)   Hunger Vital Sign    Worried About Running Out of Food in the Last Year: Sometimes true    Ran Out of Food in the Last Year: Never true  Transportation Needs: No Transportation Needs (10/19/2023)   Received from [facility] - Transportation    In the past 12 months, has lack of transportation kept you from medical appointments or from getting medications?: No    In the past 12 months, has lack of transportation kept you from meetings, work, or from getting things needed  for daily living?: No  Physical Activity: Not on file  Stress: No Stress Concern Present (10/18/2023)   Received from [facility] - Occupational Stress Questionnaire    Do you feel stress - tense, restless, nervous, or anxious, or unable to sleep at night because your mind is troubled all the time - these days?: Not at all  Social Connections: Unknown (06/12/2021)   Received from [facility]   Social Network    Social Network: Not on file     Family History: The patient's family history includes Heart attack in her sister; Heart disease in her mother; Hypertension in her brother, brother, sister, sister, sister, and sister.  ROS:   Please see the history of present illness.     All other systems reviewed and are negative.  EKGs/Labs/Other Studies Reviewed:         Recent  Labs: 01/11/2023: NT-Pro BNP 3,637; TSH 1.000 02/22/2023: Hemoglobin 13.0; Platelets 201 04/08/2023: ALT 10; BUN 8; Creatinine, Ser 1.39; Potassium 4.0; Sodium 138   Recent Lipid Panel    Component Value Date/Time   CHOL 188 04/08/2023 0911   TRIG 83 04/08/2023 0911   HDL 42 04/08/2023 0911   CHOLHDL 4.5 (H) 04/08/2023 0911   CHOLHDL 3.8 11/18/2020 0154   VLDL 19 11/18/2020 0154   LDLCALC 131 (H) 04/08/2023 0911    Physical Exam:   VS:  BP 106/70 (BP Location: Left Arm, Patient Position: Sitting, Cuff Size: Large)   Pulse 67   Resp 17   Ht 5' 5 (1.651 m)   Wt 207 lb (93.9 kg)   SpO2 99%   BMI 34.45 kg/m  , BMI Body mass index is 34.45 kg/m.  Vitals:   10/27/23 1327  BP: 106/70  Pulse: 67  Resp: 17  Height: 5' 5 (1.651 m)  Weight: 207 lb (93.9 kg)  SpO2: 99%  BMI (Calculated): 34.45     GENERAL:  Well appearing, overweight HEENT: Pupils equal round and reactive, fundi not visualized, oral mucosa unremarkable NECK:  No jugular venous distention, waveform within normal limits, carotid upstroke brisk and symmetric, no bruits, no thyromegaly LYMPHATICS:  No cervical adenopathy LUNGS:  Clear to auscultation bilaterally HEART:  RRR.  PMI not displaced or sustained,S1 and S2 within normal limits, no S3, no S4, no clicks, no rubs, no murmurs ABD:  Flat, positive bowel sounds normal in frequency in pitch, no bruits, no rebound, no guarding, no midline pulsatile mass, no hepatomegaly, no splenomegaly EXT:  2 plus pulses throughout, no edema, no cyanosis no clubbing SKIN:  No rashes no nodules NEURO:  Cranial nerves II through XII grossly intact, motor grossly intact throughout PSYCH:  Cognitively intact, oriented to person place and time   ASSESSMENT/PLAN:    HTN - BP now hypotensive with lightheadedness. BP previously markedly elevated in setting of self adjusting her antihypertensive therapy.. Her antihypertensives are now all in her pill pack. Pill pack contains: Due  to hypotension stop Hydralazine  25mg  twice daily.  Continue Amlodipine  5mg  at bedtime (could uptitrate in future) Continue Imdur  120mg  QAM. Continue Metoprolol  Succinate 25mg  at bedtime Continue Olmesartan  40mg  daily Bring BP cuff to next clinic visit.  Getting Rx through [facility] adherence packaging and delivery  Subvalvular membrane - Noted by TEE 02/2023. Continue to monitor. Treatment of hypertension, as above.   Nonobstructive CAD/HLD, LDL goal less than 70 - Persistent chest pain out of proportion to CAD by [facility] 01/2023. Ranexa  previously  stopped due to inability to swallow tablet size. Continue Imdur  120mg  daily, Toprol  25mg  daily, Amlodipine  5mg  daily. Changes to BP regimen, as above. Continue Atorvastatin  80mg  daily.  Rx Zetia  10mg  daily as LDL not at goal <70. Plan for lipid panel at follow up.  Encouraged to reach out to cardiac rehab at [facility] where she was referred.   Tobacco use - Congratulated on cessation.   Screening for Secondary Hypertension:     Relevant Labs/Studies:    Latest Ref Rng & Units 04/08/2023    9:11 AM 02/22/2023    8:11 AM 01/18/2023   10:47 AM  Basic Labs  Sodium 134 - 144 mmol/L 138  139  140   Potassium 3.5 - 5.2 mmol/L 4.0  4.0  3.4   Creatinine 0.57 - 1.00 mg/dL 8.60  8.61         Latest Ref Rng & Units 01/11/2023    8:17 AM  Thyroid   TSH 0.450 - 4.500 uIU/mL 1.000        Latest Ref Rng & Units 04/08/2023    9:11 AM  Renin/Aldosterone   Aldosterone 0.0 - 30.0 ng/dL 1.6   Aldos/Renin Ratio 0.0 - 30.0 2.2              Disposition:    FU with MD/APP/PharmD in 3 months    Medication Adjustments/Labs and Tests Ordered: Current medicines are reviewed at length with the patient today.  Concerns regarding medicines are outlined above.  No orders of the defined types were placed in this encounter.  Meds ordered this encounter  Medications   ezetimibe  (ZETIA ) 10 MG tablet    Sig: Take 1 tablet (10 mg total) by  mouth daily.    Dispense:  90 tablet    Refill:  3    Mail order pill pack    Supervising Provider:   LONNI Porter [[id number]]     Signed, Reche GORMAN Finder, NP  10/27/2023 2:03 PM    [facility]

## 2023-10-28 ENCOUNTER — Other Ambulatory Visit: Payer: Self-pay

## 2023-11-20 ENCOUNTER — Other Ambulatory Visit (HOSPITAL_COMMUNITY): Payer: Self-pay

## 2023-11-21 ENCOUNTER — Other Ambulatory Visit (HOSPITAL_COMMUNITY): Payer: Self-pay

## 2023-11-21 ENCOUNTER — Other Ambulatory Visit: Payer: Self-pay

## 2023-11-21 ENCOUNTER — Other Ambulatory Visit (HOSPITAL_BASED_OUTPATIENT_CLINIC_OR_DEPARTMENT_OTHER): Payer: Self-pay

## 2023-11-24 ENCOUNTER — Other Ambulatory Visit: Payer: Self-pay

## 2023-11-24 ENCOUNTER — Other Ambulatory Visit (HOSPITAL_COMMUNITY): Payer: Self-pay

## 2023-11-25 ENCOUNTER — Other Ambulatory Visit: Payer: Self-pay

## 2023-11-28 ENCOUNTER — Other Ambulatory Visit: Payer: Self-pay

## 2023-12-12 ENCOUNTER — Other Ambulatory Visit (HOSPITAL_COMMUNITY): Payer: Self-pay

## 2023-12-12 ENCOUNTER — Other Ambulatory Visit: Payer: Self-pay

## 2023-12-19 ENCOUNTER — Other Ambulatory Visit: Payer: Self-pay

## 2023-12-20 ENCOUNTER — Other Ambulatory Visit: Payer: Self-pay

## 2023-12-21 ENCOUNTER — Other Ambulatory Visit: Payer: Self-pay

## 2023-12-22 ENCOUNTER — Other Ambulatory Visit: Payer: Self-pay

## 2024-01-23 ENCOUNTER — Telehealth: Payer: Self-pay | Admitting: Family

## 2024-01-23 ENCOUNTER — Other Ambulatory Visit (HOSPITAL_BASED_OUTPATIENT_CLINIC_OR_DEPARTMENT_OTHER): Payer: Self-pay

## 2024-01-23 ENCOUNTER — Other Ambulatory Visit (HOSPITAL_COMMUNITY): Payer: Self-pay

## 2024-01-23 DIAGNOSIS — Z79899 Other long term (current) drug therapy: Secondary | ICD-10-CM

## 2024-01-23 DIAGNOSIS — I10 Essential (primary) hypertension: Secondary | ICD-10-CM

## 2024-01-23 NOTE — Telephone Encounter (Signed)
" °*  STAT* If patient is at the pharmacy, call can be transferred to refill team.   1. Which medications need to be refilled? (please list name of each medication and dose if known)    nitroGLYCERIN  (NITROSTAT ) 0.4 MG SL tablet     4. Which pharmacy/location (including street and city if local pharmacy) is medication to be sent to? [facility] [place]     5. Do they need a 30 day or 90 day supply? 90   "

## 2024-01-24 ENCOUNTER — Other Ambulatory Visit (HOSPITAL_COMMUNITY): Payer: Self-pay

## 2024-01-24 MED ORDER — NITROGLYCERIN 0.4 MG SL SUBL
0.4000 mg | SUBLINGUAL_TABLET | SUBLINGUAL | 5 refills | Status: AC | PRN
  Filled 2024-01-24: qty 25, 7d supply, fill #0
  Filled 2024-01-27: qty 25, 1d supply, fill #0
  Filled 2024-03-01: qty 25, 1d supply, fill #1

## 2024-01-24 NOTE — Telephone Encounter (Signed)
 Refill sent

## 2024-01-25 ENCOUNTER — Other Ambulatory Visit: Payer: Self-pay

## 2024-01-27 ENCOUNTER — Other Ambulatory Visit: Payer: Self-pay

## 2024-01-27 ENCOUNTER — Other Ambulatory Visit (HOSPITAL_BASED_OUTPATIENT_CLINIC_OR_DEPARTMENT_OTHER): Payer: Self-pay

## 2024-01-30 ENCOUNTER — Other Ambulatory Visit (HOSPITAL_BASED_OUTPATIENT_CLINIC_OR_DEPARTMENT_OTHER): Payer: Self-pay

## 2024-01-30 ENCOUNTER — Other Ambulatory Visit: Payer: Self-pay

## 2024-01-31 ENCOUNTER — Other Ambulatory Visit: Payer: Self-pay

## 2024-02-01 ENCOUNTER — Other Ambulatory Visit: Payer: Self-pay

## 2024-02-01 ENCOUNTER — Ambulatory Visit: Payer: Self-pay | Admitting: Pharmacist

## 2024-02-07 ENCOUNTER — Ambulatory Visit: Payer: Self-pay | Admitting: Pharmacist

## 2024-02-07 ENCOUNTER — Encounter (HOSPITAL_BASED_OUTPATIENT_CLINIC_OR_DEPARTMENT_OTHER): Payer: Self-pay | Admitting: Cardiovascular Disease

## 2024-02-07 ENCOUNTER — Ambulatory Visit (INDEPENDENT_AMBULATORY_CARE_PROVIDER_SITE_OTHER): Admitting: Cardiovascular Disease

## 2024-02-07 ENCOUNTER — Other Ambulatory Visit: Payer: Self-pay

## 2024-02-07 ENCOUNTER — Other Ambulatory Visit (HOSPITAL_BASED_OUTPATIENT_CLINIC_OR_DEPARTMENT_OTHER): Payer: Self-pay

## 2024-02-07 VITALS — BP 144/86 | HR 60 | Ht 65.0 in | Wt 220.0 lb

## 2024-02-07 DIAGNOSIS — I10 Essential (primary) hypertension: Secondary | ICD-10-CM | POA: Diagnosis not present

## 2024-02-07 DIAGNOSIS — Q244 Congenital subaortic stenosis: Secondary | ICD-10-CM

## 2024-02-07 DIAGNOSIS — E785 Hyperlipidemia, unspecified: Secondary | ICD-10-CM

## 2024-02-07 DIAGNOSIS — Z5181 Encounter for therapeutic drug level monitoring: Secondary | ICD-10-CM | POA: Diagnosis not present

## 2024-02-07 DIAGNOSIS — I2510 Atherosclerotic heart disease of native coronary artery without angina pectoris: Secondary | ICD-10-CM | POA: Insufficient documentation

## 2024-02-07 DIAGNOSIS — Z8679 Personal history of other diseases of the circulatory system: Secondary | ICD-10-CM | POA: Diagnosis not present

## 2024-02-07 DIAGNOSIS — E118 Type 2 diabetes mellitus with unspecified complications: Secondary | ICD-10-CM

## 2024-02-07 MED ORDER — ROSUVASTATIN CALCIUM 40 MG PO TABS
40.0000 mg | ORAL_TABLET | Freq: Every day | ORAL | 3 refills | Status: AC
  Filled 2024-02-07: qty 90, 90d supply, fill #0
  Filled 2024-02-29 – 2024-03-01 (×2): qty 30, 30d supply, fill #0

## 2024-02-07 NOTE — Patient Instructions (Signed)
 Medication Instructions:  STOP ATORVASTATIN    START ROSUVASTATIN  40 MG DAILY   Labwork: FASTING LP/CMET ABOUT A WEEK PRIOR TO FOLLOW UP   Testing/Procedures: NONE  Follow-Up: 4 MONTHS WITH DR Spencer OR CAITLIN W NP   You have been referred to INTERVENTIONAL RADIOLOGY   If you need a refill on your cardiac medications before your next appointment, please call your pharmacy.

## 2024-02-07 NOTE — Progress Notes (Signed)
 "  Advanced Hypertension Clinic Follow Up:    Date:  02/07/2024   ID:  Jodi Porter, DOB 10-02-1975, MRN [id number]  PCP:  [name], MD  Cardiologist:  Annabella Scarce, MD  Nephrologist:  Referring MD: [name], MD   CC: Hypertension  History of Present Illness:    Jodi Porter is a 49 y.o. female with a hx of CAD status post STEMI, subvalvular aortic membrane, diabetes, TIA, hypertension, hyperlipidemia, OSA, and obesity here for follow-up.  She was previously a patient of Dr. Claudene.  She was admitted 10/2020 with STEMI.  She was found to have nonobstructive disease (50-60% RCA).  This was thought to be a ruptured plaque with thrombus resolution versus spasm.  She was medically managed.  She followed up with recurrent chest pain 11/2020 and cath was similar.  She is struggled with hypertension.  Blood pressures have been labile ranging from the 90s to 200s.  She is also struggled to tolerate CPAP.  At her visit with Dr. Okey 10/2022 blood pressure was uncontrolled and losartan  was increased.  She was also referred for repeat right and left heart catheterization 01/2023 revealed nonobstructive coronary disease with mild plaque in the LAD and left circumflex and mild to moderate disease in the RCA.  Systolic blood pressures over 799.  Echo revealed LVEF 60-65% with moderate LVH and grade 2 diastolic dysfunction.  There was moderate to severe aortic regurgitation.  Gradients across the valve were 37 mmHg.  There was suspicion for subvalvular stenosis.  She was referred for TEE 02/2023 at which time she was noted to have a subvalvular membrane with a mean gradient of 28 mmHg.  Blood pressures were again over 200.  HCTZ and losartan  were discontinued.  She was switched to chlorthalidone  and olmesartan .  Hydralazine  was increased.  Renin and aldosterone orders were placed but not obtained.  Jodi Porter was seen at the ED at [facility] on 03/17/2023 for chest pain.  Blood pressure was 122/70  at the time.  Cardiac enzymes were negative.  They were concerned her blood pressures may be dropping too low and advised her to reduce the chlorthalidone  to 12.5 mg.  At her visit 04/2023 she reported that her blood pressures had been as high as the 200s in the past.  More recently she had episodes of hypotension as well as 90s over 40s and these episodes were symptomatic.  She also reported exertional angina and shortness of breath.  Due to her symptomatic hypotension hydralazine  and chlorthalidone  were reduced.  She was started on Ranexa  and referred to urology for nocturia.  She follows up 08/2023 and reported chest pain when lying down but better when sitting up.  Home blood pressures were in the 130s to 150s.  She had not taken the Ranexa  that was prescribed and had not reduced the hydralazine .  She was set up for pill packs through, pharmacy.  At her visit 09/2023 she noted that she was pursuing disability and is frustrated that she was unable to work.  She was seen 10/2023 at [facility] for dizziness.  Stroke evaluation was unremarkable and she was diagnosed with a TIA.  Her medications including aspirin , Plavix , and atorvastatin  were not changed.  Echo was unremarkable.  Her dizziness responded to meclizine.  She last saw Reche Finder, NP 10/2023 and reported arm numbness not taking her isosorbide .  Blood pressure was 106/70 and hydralazine  was discontinued.  Zetia  was added and she was encouraged to enroll in cardiac  rehab as previously referred.  Discussed the use of AI scribe software for clinical note transcription with the patient, who gave verbal consent to proceed.  History of Present Illness Jodi Porter experiences ongoing dizziness and balance issues following a TIA, initially thought to be vertigo but later diagnosed as a transient ischemic attack (TIA). Her dizziness is characterized by a sensation of her head spinning, particularly when exposed to sunlight or driving at certain speeds. These  symptoms have led her to frequently pull over while driving, and she often relies on her husband to drive due to safety concerns.  She notes deterioration in her penmanship and struggles with her thought process and speech at times. Despite these challenges, she feels her balance is gradually improving, although inconsistently.  Her blood pressure at home ranges between 120 and 160 mmHg. She takes amlodipine  daily, along with other medications in a pill pack, and sometimes adjusts her medication intake based on how she feels. She also takes aspirin  for stroke prevention and over-the-counter medications for dizziness.  She saw neurology 01/16/2024 who felt that the posterior circulation stenoses may be contributing to her dizziness.  She is frustrated that she cannot be seen by interventional radiology until March.  He also recommended MRI of the C-spine as well as repeat brain imaging.  He recommended considering a vasculitis workup if abnormal.  She has difficulty swallowing her cholesterol medication due to a missing tooth and often has her husband crush the pills into a powder. She was unable to undergo a scheduled root canal because her dentist would not perform the procedure while she was taking nitroglycerin .  In terms of physical activity, she only exercises around the house and does not engage in more strenuous activities like running or jogging. She does not have exercise equipment at home and is hesitant to use a treadmill due to her balance issues. She considers using a gym for safer exercise options like a recumbent bike.  She is concerned about her dizziness and balance issues, particularly when driving, and notes that her children are scared when she has to pull over due to these symptoms.   Previous antihypertensives:   Past Medical History:  Diagnosis Date   CAD in native artery 02/07/2024   Gestational diabetes    Gestational diabetes    Hypertension    Subvalvular aortic  stenosis 04/08/2023    Past Surgical History:  Procedure Laterality Date   CORONARY PRESSURE/FFR STUDY N/A 11/18/2020   Procedure: INTRAVASCULAR PRESSURE WIRE/FFR STUDY;  Surgeon: Claudene Victory ORN, MD;  Location: [facility] INVASIVE CV LAB;  Service: Cardiovascular;  Laterality: N/A;   CORONARY/GRAFT ACUTE MI REVASCULARIZATION N/A 10/26/2020   Procedure: Coronary/Graft Acute MI Revascularization;  Surgeon: Claudene Victory ORN, MD;  Location: [facility] INVASIVE CV LAB;  Service: Cardiovascular;  Laterality: N/A;   ECTOPIC PREGNANCY SURGERY     LEFT HEART CATH AND CORONARY ANGIOGRAPHY N/A 10/26/2020   Procedure: LEFT HEART CATH AND CORONARY ANGIOGRAPHY;  Surgeon: Claudene Victory ORN, MD;  Location: [facility] INVASIVE CV LAB;  Service: Cardiovascular;  Laterality: N/A;   LEFT HEART CATH AND CORONARY ANGIOGRAPHY N/A 11/18/2020   Procedure: LEFT HEART CATH AND CORONARY ANGIOGRAPHY;  Surgeon: Claudene Victory ORN, MD;  Location: [facility] INVASIVE CV LAB;  Service: Cardiovascular;  Laterality: N/A;   RIGHT/LEFT HEART CATH AND CORONARY ANGIOGRAPHY N/A 01/18/2023   Procedure: RIGHT/LEFT HEART CATH AND CORONARY ANGIOGRAPHY;  Surgeon: Wonda Sharper, MD;  Location: [facility] INVASIVE CV LAB;  Service: Cardiovascular;  Laterality: N/A;   TRANSESOPHAGEAL  ECHOCARDIOGRAM (CATH LAB) N/A 02/24/2023   Procedure: TRANSESOPHAGEAL ECHOCARDIOGRAM;  Surgeon: Raford Riggs, MD;  [facility] INVASIVE CV LAB;  Service: Cardiovascular;  Laterality: N/A;    Current Medications: Current Meds  Medication Sig   amLODipine  (NORVASC ) 5 MG tablet Take 1 tablet (5 mg total) by mouth at bedtime.   aspirin  EC 81 MG tablet Take 81 mg by mouth as needed. Swallow whole. (Patient taking differently: Take 81 mg by mouth every other day. Swallow whole.)   ezetimibe  (ZETIA ) 10 MG tablet Take 1 tablet (10 mg total) by mouth daily.   isosorbide  mononitrate (IMDUR ) 120 MG 24 hr tablet Take 1 tablet (120 mg total) by mouth in the morning.   levonorgestrel (MIRENA, 52 MG,) 20 MCG/DAY  IUD Mirena 21 mcg/24 hours (8 yrs) 52 mg intrauterine device  to be inserted by provider for one dose   metoprolol  succinate (TOPROL -XL) 25 MG 24 hr tablet Take 1 tablet (25 mg total) by mouth at bedtime. Take with or immediately following a meal.   nitroGLYCERIN  (NITROSTAT ) 0.4 MG SL tablet Place 1 tablet (0.4 mg total) under the tongue every 5 (five) minutes as needed.   olmesartan  (BENICAR ) 40 MG tablet Take 1 tablet (40 mg total) by mouth in the morning.   rosuvastatin  (CRESTOR ) 40 MG tablet Take 1 tablet (40 mg total) by mouth daily.   [DISCONTINUED] atorvastatin  (LIPITOR ) 80 MG tablet Take 1 tablet (80 mg total) by mouth at bedtime.     Allergies:   Penicillins and Tramadol   Social History   Socioeconomic History   Marital status: Single    Spouse name: Not on file   Number of children: 4   Years of education: Not on file   Highest education level: Not on file  Occupational History   Not on file  Tobacco Use   Smoking status: Former    Current packs/day: 0.50    Average packs/day: 0.5 packs/day for 0.5 years (0.3 ttl pk-yrs)    Types: Cigarettes    Start date: 08/2023    Passive exposure: Past   Smokeless tobacco: Never  Vaping Use   Vaping status: Never Used  Substance and Sexual Activity   Alcohol use: No   Drug use: No   Sexual activity: Yes    Birth control/protection: I.U.D.  Other Topics Concern   Not on file  Social History Narrative   Not on file   Social Drivers of Health   Tobacco Use: Medium Risk (02/07/2024)   Patient History    Smoking Tobacco Use: Former    Smokeless Tobacco Use: Never    Passive Exposure: Past  Physicist, Medical Strain: Low Risk (11/01/2023)   Received from [facility]   Overall Financial Resource Strain (CARDIA)    How hard is it for you to pay for the very basics like food, housing, medical care, and heating?: Not hard at all  Food Insecurity: No Food Insecurity (11/01/2023)   Received from [facility]   Epic    Within  the past 12 months, you worried that your food would run out before you got the money to buy more.: Never true    Within the past 12 months, the food you bought just didn't last and you didn't have money to get more.: Never true  Recent Concern: Food Insecurity - Food Insecurity Present (08/10/2023)   Epic    Worried About Radiation Protection Practitioner of Food in the Last Year: Sometimes true    [employer]  in the Last Year: Never true  Transportation Needs: No Transportation Needs (11/01/2023)   Received from [facility]    In the past 12 months, has lack of transportation kept you from medical appointments or from getting medications?: No    In the past 12 months, has lack of transportation kept you from meetings, work, or from getting things needed for daily living?: No  Physical Activity: Not on file  Stress: No Stress Concern Present (10/18/2023)   Received from [facility] - Occupational Stress Questionnaire    Do you feel stress - tense, restless, nervous, or anxious, or unable to sleep at night because your mind is troubled all the time - these days?: Not at all  Social Connections: Not on file  Depression (PHQ2-9): Not on file  Alcohol Screen: Not on file  Housing: Low Risk (11/01/2023)   Received from [facility]    In the last 12 months, was there a time when you were not able to pay the mortgage or rent on time?: No    In the past 12 months, how many times have you moved where you were living?: 0    At any time in the past 12 months, were you homeless or living in a shelter (including now)?: No  Utilities: Not At Risk (11/01/2023)   Received from [facility]    In the past 12 months has the electric, gas, oil, or water company threatened to shut off services in your home?: No  Health Literacy: Adequate Health Literacy (08/10/2023)   B1300 Health Literacy    Frequency of need for help with medical instructions: Never      Family History: The patient's family history includes Heart attack in her sister; Heart disease in her mother; Hypertension in her brother, brother, sister, sister, sister, and sister.  ROS:   Please see the history of present illness.    All other systems reviewed and are negative.  EKGs/Labs/Other Studies Reviewed:    EKG:  EKG is not ordered today.    RHC/LHC 01/2023: 1.  Nonobstructive coronary artery disease with patency of the left main, mild nonobstructive plaquing in the LAD and left circumflex, and mild to moderate nonobstructive plaquing in the RCA unchanged from the previous cardiac catheterization study in 2022 2.  Essentially normal right heart catheterization data, preserved cardiac output of 5.6 L/min 3.  Severe systemic hypertension with systolic blood pressure greater than 200 mmHg, treated with IV labetalol  and hydralazine  during the procedure   Recommendations: Medical therapy, titration of antihypertensive treatment as I suspect the patient's symptoms are related to hypertensive heart disease. She has had problems with drug intolerances over time.   Echo 02/2023:  1. Left ventricular ejection fraction, by estimation, is 60 to 65%. The  left ventricle has normal function. The left ventricle has no regional  wall motion abnormalities. There is moderate left ventricular hypertrophy.  Left ventricular diastolic  parameters are consistent with Grade II diastolic dysfunction  (pseudonormalization). Elevated left atrial pressure.   2. Right ventricular systolic function is normal. The right ventricular  size is normal. There is normal pulmonary artery systolic pressure. The  estimated right ventricular systolic pressure is 28.0 mmHg.   3. The mitral valve is normal in structure. Trivial mitral valve  regurgitation.   4. The inferior vena cava is normal in size with greater than 50%  respiratory variability, suggesting right atrial  pressure of 3 mmHg.   5. The aortic  valve was not well visualized. Aortic valve regurgitation  is moderate to severe. There are severely elevated gradients through  aortic valve (Vmax 4.2 m/s, MG 37 mmHg). Aortic valve is not well  visualized but appears to open on parasternal  long axis images. Suspect subvalvular stenosis, possible subaortic  membrane. Recommend TEE for further evaluation    Recent Labs: 02/22/2023: Hemoglobin 13.0; Platelets 201 04/08/2023: ALT 10; BUN 8; Creatinine, Ser 1.39; Potassium 4.0; Sodium 138   Recent Lipid Panel    Component Value Date/Time   CHOL 188 04/08/2023 0911   TRIG 83 04/08/2023 0911   HDL 42 04/08/2023 0911   CHOLHDL 4.5 (H) 04/08/2023 0911   CHOLHDL 3.8 11/18/2020 0154   VLDL 19 11/18/2020 0154   LDLCALC 131 (H) 04/08/2023 0911    Physical Exam:   VS:  BP (!) 144/86 (BP Location: Left Arm, Patient Position: Sitting, Cuff Size: Large)   Pulse 60   Ht 5' 5 (1.651 m)   Wt 220 lb (99.8 kg)   SpO2 98%   BMI 36.61 kg/m  , BMI Body mass index is 36.61 kg/m. GENERAL:  Well appearing HEENT: Pupils equal round and reactive, fundi not visualized, oral mucosa unremarkable NECK:  No jugular venous distention, waveform within normal limits, carotid upstroke brisk and symmetric, no bruits, no thyromegaly LUNGS:  Clear to auscultation bilaterally HEART:  RRR.  PMI not displaced or sustained,S1 and S2 within normal limits, no S3, no S4, no clicks, no rubs, III/VI murmurs ABD:  Flat, positive bowel sounds normal in frequency in pitch, no bruits, no rebound, no guarding, no midline pulsatile mass, no hepatomegaly, no splenomegaly EXT:  2 plus pulses throughout, no edema, no cyanosis no clubbing SKIN:  No rashes no nodules NEURO:  Cranial nerves II through XII grossly intact, motor grossly intact throughout PSYCH:  Cognitively intact, oriented to person place and time   ASSESSMENT/PLAN:    Assessment & Plan # Transient ischemic attack with residual deficits Residual dizziness, balance  issues, and cognitive difficulties. Her neurologist is concerned that this is due to arterial narrowing in the posterior circulation.  Meclizine has been ineffective and sedating. Blood pressure management crucial for cerebral perfusion.  Ideally blood pressure would be less than 130/80.  However given the concern for cerebral hypoperfusion we will not be more aggressive with her medicine at this time. - Follow-up with interventional radiology for arterial narrowing evaluation. - Maintain current blood pressure range for cerebral perfusion. - Continue aspirin  for stroke prevention.  # Resistant hypertension Blood pressure between 120-160 mmHg. Managed with amlodipine , metoprolol , and olmesartan .  She did not feel well when on additional antihypertensives and had tighter blood pressure control in the past.  Higher range maintained for cerebral perfusion due to TIA deficits. - Continue amlodipine . - Monitor blood pressure to maintain target range.  # Hyperlipidemia Difficulty swallowing current medication due to pill size. Alternative considered for compliance. - Switched to rosuvastatin , smaller and easier to swallow. - Recommended pill cutter for easier swallowing. - LDL goal <55 - Repeat lipids and CMP in 2 to 3 months.   Screening for Secondary Hypertension:     Relevant Labs/Studies:    Latest Ref Rng & Units 04/08/2023    9:11 AM 02/22/2023    8:11 AM 01/18/2023   10:47 AM  Basic Labs  Sodium 134 - 144 mmol/L 138  139  140   Potassium 3.5 - 5.2 mmol/L 4.0  4.0  3.4   Creatinine 0.57 - 1.00 mg/dL 8.60  8.61         Latest Ref Rng & Units 01/11/2023    8:17 AM  Thyroid   TSH 0.450 - 4.500 uIU/mL 1.000     Disposition:    FU with MD/PharmD in 4 months   Medication Adjustments/Labs and Tests Ordered: Current medicines are reviewed at length with the patient today.  Concerns regarding medicines are outlined above.  Orders Placed This Encounter  Procedures   Lipid panel    Comprehensive metabolic panel with GFR   Ambulatory referral to Interventional Radiology   EKG 12-Lead   Meds ordered this encounter  Medications   rosuvastatin  (CRESTOR ) 40 MG tablet    Sig: Take 1 tablet (40 mg total) by mouth daily.    Dispense:  90 tablet    Refill:  3    D/C ATORVASTATIN      Signed, Annabella Scarce, MD  02/07/2024 12:46 PM    [facility]  "

## 2024-02-29 ENCOUNTER — Other Ambulatory Visit: Payer: Self-pay

## 2024-02-29 ENCOUNTER — Other Ambulatory Visit: Payer: Self-pay | Admitting: Family

## 2024-02-29 DIAGNOSIS — I10 Essential (primary) hypertension: Secondary | ICD-10-CM

## 2024-02-29 DIAGNOSIS — Z79899 Other long term (current) drug therapy: Secondary | ICD-10-CM

## 2024-03-01 ENCOUNTER — Other Ambulatory Visit (HOSPITAL_COMMUNITY): Payer: Self-pay

## 2024-03-01 ENCOUNTER — Other Ambulatory Visit (HOSPITAL_BASED_OUTPATIENT_CLINIC_OR_DEPARTMENT_OTHER): Payer: Self-pay

## 2024-03-01 ENCOUNTER — Other Ambulatory Visit: Payer: Self-pay

## 2024-03-01 ENCOUNTER — Other Ambulatory Visit: Payer: Self-pay | Admitting: Family

## 2024-03-01 DIAGNOSIS — I10 Essential (primary) hypertension: Secondary | ICD-10-CM

## 2024-03-01 DIAGNOSIS — Z79899 Other long term (current) drug therapy: Secondary | ICD-10-CM

## 2024-03-02 ENCOUNTER — Other Ambulatory Visit: Payer: Self-pay

## 2024-03-02 ENCOUNTER — Telehealth (HOSPITAL_BASED_OUTPATIENT_CLINIC_OR_DEPARTMENT_OTHER): Payer: Self-pay | Admitting: *Deleted

## 2024-03-02 DIAGNOSIS — Z79899 Other long term (current) drug therapy: Secondary | ICD-10-CM

## 2024-03-02 DIAGNOSIS — I10 Essential (primary) hypertension: Secondary | ICD-10-CM

## 2024-03-02 MED ORDER — AMLODIPINE BESYLATE 5 MG PO TABS
5.0000 mg | ORAL_TABLET | Freq: Every day | ORAL | 3 refills | Status: AC
  Filled 2024-03-02: qty 30, 30d supply, fill #0

## 2024-03-02 MED ORDER — OLMESARTAN MEDOXOMIL 40 MG PO TABS
40.0000 mg | ORAL_TABLET | Freq: Every morning | ORAL | 3 refills | Status: AC
  Filled 2024-03-02: qty 30, 30d supply, fill #0

## 2024-03-02 MED ORDER — METOPROLOL SUCCINATE ER 25 MG PO TB24
25.0000 mg | ORAL_TABLET | Freq: Every day | ORAL | 3 refills | Status: AC
  Filled 2024-03-02: qty 30, 30d supply, fill #0

## 2024-03-02 MED ORDER — ISOSORBIDE MONONITRATE ER 120 MG PO TB24
120.0000 mg | ORAL_TABLET | Freq: Every morning | ORAL | 3 refills | Status: AC
  Filled 2024-03-02: qty 30, 30d supply, fill #0

## 2024-03-02 NOTE — Telephone Encounter (Signed)
 Hey! We are trying to fill patient's medication packets for next month. This patient needs a refill on the following medications: amlodipine , isosorbide , metoprolol  and olmesartan . Thanks!   Above received via secure chat  Refilled as requested
